# Patient Record
Sex: Female | Born: 1955
Health system: Southern US, Community
[De-identification: ages and names within clinical notes are randomized; demographics above are authoritative.]

## PROBLEM LIST (undated history)

## (undated) DIAGNOSIS — D649 Anemia, unspecified: Secondary | ICD-10-CM

## (undated) DIAGNOSIS — Z9289 Personal history of other medical treatment: Secondary | ICD-10-CM

## (undated) DIAGNOSIS — I10 Essential (primary) hypertension: Secondary | ICD-10-CM

## (undated) DIAGNOSIS — M549 Dorsalgia, unspecified: Secondary | ICD-10-CM

## (undated) DIAGNOSIS — C801 Malignant (primary) neoplasm, unspecified: Secondary | ICD-10-CM

## (undated) DIAGNOSIS — S335XXA Sprain of ligaments of lumbar spine, initial encounter: Secondary | ICD-10-CM

## (undated) DIAGNOSIS — E119 Type 2 diabetes mellitus without complications: Secondary | ICD-10-CM

## (undated) DIAGNOSIS — Z9221 Personal history of antineoplastic chemotherapy: Secondary | ICD-10-CM

## (undated) DIAGNOSIS — Z923 Personal history of irradiation: Secondary | ICD-10-CM

## (undated) DIAGNOSIS — E785 Hyperlipidemia, unspecified: Secondary | ICD-10-CM

## (undated) HISTORY — DX: Personal history of other medical treatment: Z92.89

## (undated) HISTORY — PX: TUBAL LIGATION: SHX77

## (undated) HISTORY — DX: Dorsalgia, unspecified: M54.9

## (undated) HISTORY — DX: Sprain of ligaments of lumbar spine, initial encounter: S33.5XXA

## (undated) HISTORY — DX: Hyperlipidemia, unspecified: E78.5

## (undated) HISTORY — DX: Type 2 diabetes mellitus without complications: E11.9

## (undated) HISTORY — DX: Essential (primary) hypertension: I10

---

## 1998-04-12 ENCOUNTER — Other Ambulatory Visit: Admission: RE | Admit: 1998-04-12 | Discharge: 1998-04-12 | Payer: Self-pay | Admitting: Obstetrics and Gynecology

## 1999-05-03 ENCOUNTER — Other Ambulatory Visit: Admission: RE | Admit: 1999-05-03 | Discharge: 1999-05-03 | Payer: Self-pay | Admitting: Obstetrics and Gynecology

## 2000-06-27 ENCOUNTER — Other Ambulatory Visit: Admission: RE | Admit: 2000-06-27 | Discharge: 2000-06-27 | Payer: Self-pay | Admitting: Obstetrics and Gynecology

## 2005-07-14 HISTORY — PX: OTHER SURGICAL HISTORY: SHX169

## 2005-07-29 ENCOUNTER — Encounter: Admission: RE | Admit: 2005-07-29 | Discharge: 2005-07-29 | Payer: Self-pay | Admitting: Surgery

## 2006-04-15 LAB — CONVERTED CEMR LAB: Pap Smear: NORMAL

## 2007-08-26 ENCOUNTER — Encounter: Payer: Self-pay | Admitting: Internal Medicine

## 2008-06-02 ENCOUNTER — Ambulatory Visit: Payer: Self-pay | Admitting: Internal Medicine

## 2008-06-02 DIAGNOSIS — E785 Hyperlipidemia, unspecified: Secondary | ICD-10-CM

## 2008-06-02 DIAGNOSIS — E119 Type 2 diabetes mellitus without complications: Secondary | ICD-10-CM

## 2008-06-02 DIAGNOSIS — I10 Essential (primary) hypertension: Secondary | ICD-10-CM

## 2008-06-02 HISTORY — DX: Type 2 diabetes mellitus without complications: E11.9

## 2008-06-02 HISTORY — DX: Essential (primary) hypertension: I10

## 2008-06-02 HISTORY — DX: Hyperlipidemia, unspecified: E78.5

## 2008-06-07 LAB — CONVERTED CEMR LAB
Albumin: 4.4 g/dL (ref 3.5–5.2)
BUN: 10 mg/dL (ref 6–23)
Basophils Absolute: 0 10*3/uL (ref 0.0–0.1)
Basophils Relative: 0.6 % (ref 0.0–3.0)
Bilirubin Urine: NEGATIVE
CO2: 29 meq/L (ref 19–32)
Calcium: 9.8 mg/dL (ref 8.4–10.5)
Chloride: 101 meq/L (ref 96–112)
Cholesterol: 185 mg/dL (ref 0–200)
Creatinine,U: 38.1 mg/dL
Eosinophils Absolute: 0 10*3/uL (ref 0.0–0.7)
Eosinophils Relative: 0.3 % (ref 0.0–5.0)
GFR calc Af Amer: 165 mL/min
GFR calc non Af Amer: 137 mL/min
Glucose, Bld: 131 mg/dL — ABNORMAL HIGH (ref 70–99)
HCT: 40.7 % (ref 36.0–46.0)
LDL Cholesterol: 98 mg/dL (ref 0–99)
Leukocytes, UA: NEGATIVE
MCHC: 33 g/dL (ref 30.0–36.0)
Monocytes Absolute: 0.2 10*3/uL (ref 0.1–1.0)
Monocytes Relative: 4.2 % (ref 3.0–12.0)
Platelets: 234 10*3/uL (ref 150–400)
Potassium: 2.9 meq/L — ABNORMAL LOW (ref 3.5–5.1)
RDW: 13 % (ref 11.5–14.6)
Total CHOL/HDL Ratio: 3.4
Total Protein, Urine: NEGATIVE mg/dL
Urine Glucose: 250 mg/dL — AB
Vit D, 25-Hydroxy: 6 ng/mL — ABNORMAL LOW (ref 30–89)
pH: 6 (ref 5.0–8.0)

## 2008-07-04 ENCOUNTER — Telehealth: Payer: Self-pay | Admitting: Internal Medicine

## 2008-08-01 ENCOUNTER — Telehealth: Payer: Self-pay | Admitting: Internal Medicine

## 2008-08-10 ENCOUNTER — Telehealth (INDEPENDENT_AMBULATORY_CARE_PROVIDER_SITE_OTHER): Payer: Self-pay | Admitting: *Deleted

## 2009-01-10 ENCOUNTER — Ambulatory Visit: Payer: Self-pay | Admitting: Internal Medicine

## 2009-01-10 LAB — CONVERTED CEMR LAB
BUN: 8 mg/dL (ref 6–23)
CO2: 28 meq/L (ref 19–32)
Calcium: 9.2 mg/dL (ref 8.4–10.5)
Creatinine, Ser: 0.4 mg/dL (ref 0.4–1.2)
Glucose, Bld: 162 mg/dL — ABNORMAL HIGH (ref 70–99)
HDL: 39.8 mg/dL (ref >39.00)
LDL Cholesterol: 102 mg/dL — ABNORMAL HIGH (ref 0–99)
Total CHOL/HDL Ratio: 4
VLDL: 16.2 mg/dL (ref 0.0–40.0)

## 2009-01-13 ENCOUNTER — Ambulatory Visit: Payer: Self-pay | Admitting: Internal Medicine

## 2009-07-27 ENCOUNTER — Telehealth: Payer: Self-pay | Admitting: Internal Medicine

## 2009-08-03 ENCOUNTER — Telehealth: Payer: Self-pay | Admitting: Internal Medicine

## 2009-08-08 ENCOUNTER — Ambulatory Visit: Payer: Self-pay | Admitting: Internal Medicine

## 2009-08-08 LAB — CONVERTED CEMR LAB
ALT: 20 units/L (ref 0–35)
Albumin: 4.1 g/dL (ref 3.5–5.2)
BUN: 10 mg/dL (ref 6–23)
Basophils Absolute: 0 10*3/uL (ref 0.0–0.1)
Bilirubin, Direct: 0.1 mg/dL (ref 0.0–0.3)
Calcium: 9.4 mg/dL (ref 8.4–10.5)
Cholesterol: 126 mg/dL (ref 0–200)
Creatinine,U: 45.8 mg/dL
HCT: 36.7 % (ref 36.0–46.0)
Hemoglobin: 12.1 g/dL (ref 12.0–15.0)
Hgb A1c MFr Bld: 7.1 % — ABNORMAL HIGH (ref 4.6–6.5)
Ketones, ur: NEGATIVE mg/dL
LDL Cholesterol: 75 mg/dL (ref 0–99)
Leukocytes, UA: NEGATIVE
Lymphs Abs: 1.6 10*3/uL (ref 0.7–4.0)
MCV: 82.6 fL (ref 78.0–100.0)
Microalb Creat Ratio: 15.3 mg/g (ref 0.0–30.0)
Monocytes Absolute: 0.3 10*3/uL (ref 0.1–1.0)
Monocytes Relative: 6.5 % (ref 3.0–12.0)
Platelets: 289 10*3/uL (ref 150.0–400.0)
RBC: 4.44 M/uL (ref 3.87–5.11)
Total CHOL/HDL Ratio: 3
VLDL: 15 mg/dL (ref 0.0–40.0)
WBC: 4.9 10*3/uL (ref 4.5–10.5)
pH: 6 (ref 5.0–8.0)

## 2009-08-11 ENCOUNTER — Ambulatory Visit: Payer: Self-pay | Admitting: Internal Medicine

## 2009-08-11 LAB — HM COLONOSCOPY

## 2009-08-14 ENCOUNTER — Encounter: Payer: Self-pay | Admitting: Internal Medicine

## 2009-08-25 ENCOUNTER — Telehealth: Payer: Self-pay | Admitting: Internal Medicine

## 2009-09-29 ENCOUNTER — Encounter: Payer: Self-pay | Admitting: Internal Medicine

## 2010-02-02 ENCOUNTER — Ambulatory Visit: Payer: Self-pay | Admitting: Internal Medicine

## 2010-03-30 ENCOUNTER — Ambulatory Visit: Payer: Self-pay | Admitting: Internal Medicine

## 2010-04-11 ENCOUNTER — Ambulatory Visit
Admission: RE | Admit: 2010-04-11 | Discharge: 2010-04-11 | Payer: Self-pay | Source: Home / Self Care | Attending: Internal Medicine | Admitting: Internal Medicine

## 2010-04-11 LAB — CONVERTED CEMR LAB
BUN: 11 mg/dL (ref 6–23)
Chloride: 102 meq/L (ref 96–112)
Glucose, Bld: 177 mg/dL — ABNORMAL HIGH (ref 70–99)
HDL: 44 mg/dL (ref >39.00)
LDL Cholesterol: 88 mg/dL (ref 0–99)
Sodium: 139 meq/L (ref 135–145)
Triglycerides: 81 mg/dL (ref 0.0–149.0)

## 2010-04-13 ENCOUNTER — Encounter: Payer: Self-pay | Admitting: Internal Medicine

## 2010-04-13 DIAGNOSIS — M549 Dorsalgia, unspecified: Secondary | ICD-10-CM

## 2010-04-13 DIAGNOSIS — S335XXA Sprain of ligaments of lumbar spine, initial encounter: Secondary | ICD-10-CM | POA: Insufficient documentation

## 2010-04-13 HISTORY — DX: Dorsalgia, unspecified: M54.9

## 2010-04-13 HISTORY — DX: Sprain of ligaments of lumbar spine, initial encounter: S33.5XXA

## 2010-04-13 LAB — CONVERTED CEMR LAB
Bilirubin Urine: NEGATIVE
Ketones, ur: NEGATIVE mg/dL
Leukocytes, UA: NEGATIVE
Nitrite: NEGATIVE
Specific Gravity, Urine: 1.015 (ref 1.000–1.030)
Urine Glucose: >=1000 mg/dL
Urobilinogen, UA: 0.2 (ref 0.0–1.0)

## 2010-05-15 ENCOUNTER — Telehealth: Payer: Self-pay | Admitting: Internal Medicine

## 2010-05-17 NOTE — Assessment & Plan Note (Signed)
Summary: 6 MO ROV /NWS   Vital Signs:  Patient profile:   55 year old female Height:      64.5 inches Weight:      160.50 pounds BMI:     27.22 O2 Sat:      99 % on Room air Temp:     99.3 degrees F oral Pulse rate:   139 / minute BP sitting:   180 / 92  (left arm) Cuff size:   regular  Vitals Entered By: Zella Ball Ewing CMA Duncan Dull) (April 13, 2010 1:46 PM)  O2 Flow:  Room air CC: 6 month ROV/RE   CC:  6 month ROV/RE.  History of Present Illness: here to f/u;  c/o onset 1 wk lower left back pain, no radiation, mild to mod;  better to lie down at night, worse to "make a wrong move" like sitting up quickly or standing up and putting the car seat in the back seat of the car;  no bowel or bladder change, no fever, wt loss , gait change or recent falls;  current meds and tylenol not heleping;  Pt denies CP, worsening sob, doe, wheezing, orthopnea, pnd, worsening LE edema, palps, dizziness or syncope  Pt denies new neuro symptoms such as headache, facial or extremity weakness  Pt denies polydipsia, polyuria, or low sugar symptoms such as shakiness improved with eating.  Overall good compliance with meds, trying to follow low chol, DM diet, wt stable, little excercise however  CBG's in the lower 200's as she stopped the actos due to pt concern about risk of bladder cancer, thought she might do ok with other meds and diet.  No fever, wt loss, night sweats, loss of appetite or other constitutional symptoms   Did not take BP meds this am.  Problems Prior to Update: 1)  Back Pain  (ICD-724.5) 2)  Back Strain, Lumbar  (ICD-847.2) 3)  Preventive Health Care  (ICD-V70.0) 4)  Hypertension  (ICD-401.9) 5)  Hyperlipidemia  (ICD-272.4) 6)  Diabetes Mellitus, Type II  (ICD-250.00)  Medications Prior to Update: 1)  Metformin Hcl 1000 Mg Tabs (Metformin Hcl) .... Bid 2)  Caduet 10-20 Mg Tabs (Amlodipine-Atorvastatin) .Marland Kitchen.. 1 By Mouth Once Daily 3)  Januvia 100 Mg Tabs (Sitagliptin Phosphate) .Marland Kitchen.. 1 By  Mouth Once Daily 4)  Diovan Hct 320-25 Mg Tabs (Valsartan-Hydrochlorothiazide) .... Once Daily 5)  Adult Aspirin Ec Low Strength 81 Mg Tbec (Aspirin) .Marland Kitchen.. 1 By Mouth Once Daily 6)  Klor-Con 10 10 Meq Cr-Tabs (Potassium Chloride) .Marland Kitchen.. 1po Once Daily 7)  Actos 15 Mg Tabs (Pioglitazone Hcl) .Marland Kitchen.. 1 By Mouth Once Daily  Current Medications (verified): 1)  Amlodipine Besylate 10 Mg Tabs (Amlodipine Besylate) .Marland Kitchen.. 1po Once Daily 2)  Janumet 50-1000 Mg Tabs (Sitagliptin-Metformin Hcl) .Marland Kitchen.. 1 By Mouth Two Times A Day 3)  Diovan Hct 320-25 Mg Tabs (Valsartan-Hydrochlorothiazide) .... Once Daily 4)  Adult Aspirin Ec Low Strength 81 Mg Tbec (Aspirin) .Marland Kitchen.. 1 By Mouth Once Daily 5)  Klor-Con 10 10 Meq Cr-Tabs (Potassium Chloride) .Marland Kitchen.. 1po Once Daily 6)  Glimepiride 2 Mg Tabs (Glimepiride) .Marland Kitchen.. 1 By Mouth Once Daily 7)  Lipitor 20 Mg Tabs (Atorvastatin Calcium) .Marland Kitchen.. 1po Once Daily 8)  Flexeril 5 Mg Tabs (Cyclobenzaprine Hcl) .Marland Kitchen.. 1 By Mouth Three Times A Day As Needed Back Pain/spasms 9)  Diclofenac Sodium 75 Mg Tbec (Diclofenac Sodium) .Marland Kitchen.. 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   Head:  normocephalic and atraumatic.   Eyes:  vision grossly intact, pupils equal, and pupils round.   Ears:  R ear normal and L ear normal.   Nose:  no external deformity and no nasal discharge.   Mouth:  no gingival abnormalities and pharynx pink and moist.     Neck:  supple and no masses.   Lungs:  normal respiratory effort and normal breath sounds.   Heart:  normal rate and regular rhythm.   Abdomen:  soft, non-tender, and normal bowel sounds.   Msk:  no joint tenderness and no joint swelling.  , but with left lower lumbar paravertebral tender Extremities:  no edema, no erythema  Neurologic:  strength normal in all extremities and gait normal.     Impression & Recommendations:  Problem # 1:  DIABETES MELLITUS, TYPE II (ICD-250.00)  The following medications were removed from the medication list:    Metformin Hcl 1000 Mg Tabs (Metformin hcl) ..... Bid Her updated medication list for this problem includes:    Janumet 50-1000 Mg Tabs (Sitagliptin-metformin hcl) .Marland Kitchen... 1 by mouth two times a day    Diovan Hct 320-25 Mg Tabs (Valsartan-hydrochlorothiazide) ..... Once daily    Adult Aspirin Ec Low Strength 81 Mg Tbec (Aspirin) .Marland Kitchen... 1 by mouth once daily    Glimepiride 2 Mg Tabs (Glimepiride) .Marland Kitchen... 1 by mouth once daily  Labs Reviewed: Creat: 0.4 (04/11/2010)    Reviewed HgBA1c results: 8.8 (04/11/2010)  7.1 (08/08/2009) currently uncontrolled due to stopping the actos and some dietary indiscretion - to change actos to glimeparide;  Pt to cont DM diet, excercise, wt control efforts; to check labs next visit    Problem # 2:  HYPERTENSION (ICD-401.9)  Her updated medication list for this problem includes:    Amlodipine Besylate 10 Mg Tabs (Amlodipine besylate) .Marland Kitchen... 1po once daily    Diovan Hct 320-25 Mg Tabs (Valsartan-hydrochlorothiazide) ..... Once daily  BP today: 180/92 Prior BP: 150/76 (08/11/2009)  Labs Reviewed: K+: 3.6 (04/11/2010) Creat: : 0.4 (04/11/2010)   Chol: 148 (04/11/2010)   HDL: 44.00 (04/11/2010)   LDL: 88 (04/11/2010)   TG: 81.0 (04/11/2010) urged complaicne with meds - Continue all previous medications as before this visit   Problem # 3:  HYPERLIPIDEMIA (ICD-272.4)  Her updated medication list for this problem  includes:    Lipitor 20 Mg Tabs (Atorvastatin calcium) .Marland Kitchen... 1po once daily  Labs Reviewed: SGOT: 17 (08/08/2009)   SGPT: 20 (08/08/2009)   HDL:44.00 (04/11/2010), 36.50 (08/08/2009)  LDL:88 (04/11/2010), 75 (08/08/2009)  Chol:148 (04/11/2010), 126 (08/08/2009)  Trig:81.0 (04/11/2010), 75.0 (08/08/2009) stable overall by hx and exam, ok to continue meds/tx as is , Pt to continue diet efforts, good med tolerance; to check labs - goal LDL less than 70   Problem # 4:  BACK STRAIN, LUMBAR (ICD-847.2) c/w mostl likely with MSK strain, but cant r/o UTI with low grade temp - for urine studies as well, and treat as above, f/u any worsening signs or symptoms  - for tx with muscle relaxer and  nsaid as needed   Complete Medication List: 1)  Amlodipine Besylate 10 Mg Tabs (Amlodipine besylate) .Marland Kitchen.. 1po once daily 2)  Janumet 50-1000 Mg Tabs (Sitagliptin-metformin hcl) .Marland Kitchen.. 1 by mouth two times a day 3)  Diovan Hct 320-25 Mg Tabs (Valsartan-hydrochlorothiazide) .... Once daily 4)  Adult Aspirin Ec Low Strength 81 Mg Tbec (Aspirin) .Marland Kitchen.. 1 by mouth once daily 5)  Klor-con 10 10 Meq Cr-tabs (Potassium chloride) .Marland Kitchen.. 1po once daily 6)  Glimepiride 2 Mg Tabs (Glimepiride) .Marland Kitchen.. 1 by mouth once daily 7)  Lipitor 20 Mg Tabs (Atorvastatin calcium) .Marland Kitchen.. 1po once daily 8)  Flexeril 5 Mg Tabs (Cyclobenzaprine hcl) .Marland Kitchen.. 1 by mouth three times a day as needed back pain/spasms 9)  Diclofenac Sodium 75 Mg Tbec (Diclofenac sodium) .Marland Kitchen.. 1 by mouth two times a day as needed pain  Other Orders: T-Culture, Urine ([id number]) TLB-Udip w/ Micro (81001-URINE)  Patient Instructions: 1)  Please take all new medications as prescribed - the muscle relaxer and pain medication (for your local pharmacy - written prescriptions) 2)  stop the [place] and metformin after you have finished the current bottles 3)  after that , start the Janumet combination pill (you are given the written 90 day prescription) 4)  stop the actos as you  have 5)  start the glimeparide for diabetes (the 30 day was sent to the local pharmacy, and you are given the written 90 day) 6)  stop the caduet after you use up what you have at home 7)  after that, start the separate prescriptions for the 2 generic meds that make up the caduet - the amlodipine 10 mg, and the lipitor 20 mg (both in the written prescripitons for [facility]) 8)  Continue all previous medications as before this visit  9)  Please follow better Diabetic diet 10)  Please go to the Lab in the basement for your urine tests today  11)  Please schedule a follow-up appointment in 6 months for CPX with labs and: 12)  HbgA1C prior to visit, ICD-9: 250.02 13)  Urine Microalbumin prior to visit, ICD-9: Prescriptions: DICLOFENAC SODIUM 75 MG TBEC (DICLOFENAC SODIUM) 1 by mouth two times a day as needed pain  #60 x 2   Entered and Authorized by:   Corwin Levins MD   Signed by:   Corwin Levins MD on 04/13/2010   Method used:   Print then Give to Patient   RxID:   [id number] FLEXERIL 5 MG TABS (CYCLOBENZAPRINE HCL) 1 by mouth three times a day as needed back pain/spasms  #60 x 0   Entered and Authorized by:   Corwin Levins MD   Signed by:   Corwin Levins MD on 04/13/2010   Method used:   Print then Give to Patient   RxID:   [id number] LIPITOR 20 MG TABS (ATORVASTATIN CALCIUM) 1po once daily  #90 x 3   Entered and Authorized by:   Corwin Levins MD   Signed by:   Corwin Levins MD on 04/13/2010   Method used:   Print then Give to Patient   RxID:   [id number] AMLODIPINE BESYLATE 10 MG TABS (AMLODIPINE BESYLATE) 1po once daily  #90 x 3   Entered and Authorized by:   Corwin Levins MD   Signed by:   Corwin Levins MD on 04/13/2010   Method used:   Print then Give to Patient   RxID:   [id number] GLIMEPIRIDE 2 MG TABS (GLIMEPIRIDE) 1 by mouth  once daily  #90 x 3   Entered and Authorized by:   Corwin Levins MD   Signed by:   Corwin Levins MD on 04/13/2010   Method used:   Print then  Give to Patient   RxID:   [id number] GLIMEPIRIDE 2 MG TABS (GLIMEPIRIDE) 1 by mouth once daily  #30 x 11   Entered and Authorized by:   Corwin Levins MD   Signed by:   Corwin Levins MD on 04/13/2010   Method used:   Electronically to        [facility] [id number] ([facility])       [address]       [place], [place]  [address]       Ph: [phone] or [phone]       Fax: [phone]   RxID:   [id number] JANUMET 50-1000 MG TABS (SITAGLIPTIN-METFORMIN HCL) 1 by mouth two times a day  #180 x 3   Entered and Authorized by:   Corwin Levins MD   Signed by:   Corwin Levins MD on 04/13/2010   Method used:   Print then Give to Patient   RxID:   [id number]    Orders Added: 1)  T-Culture, Urine [[id number]] 2)  TLB-Udip w/ Micro [81001-URINE] 3)  Est. Patient Level IV [10626]

## 2010-05-17 NOTE — Progress Notes (Signed)
  Phone Note Refill Request  on July 27, 2009 1:50 PM  Refills Requested: Medication #1:  JANUVIA 100 MG TABS 1 by mouth once daily   Dosage confirmed as above?Dosage Confirmed   Notes: [facility] Initial call taken by: Scharlene Gloss,  July 27, 2009 1:51 PM    Prescriptions: KLOR-CON 10 10 MEQ CR-TABS (POTASSIUM CHLORIDE) 1po once daily  #90 x 2   Entered by:   Scharlene Gloss   Authorized by:   Corwin Levins MD   Signed by:   Scharlene Gloss on 07/27/2009   Method used:   Faxed to ...       [facility]* (mail-order)             ,          Ph: [phone]       Fax: [phone]   RxID:   [id number] JANUVIA 100 MG TABS (SITAGLIPTIN PHOSPHATE) 1 by mouth once daily  #90 x 2   Entered by:   Scharlene Gloss   Authorized by:   Corwin Levins MD   Signed by:   Scharlene Gloss on 07/27/2009   Method used:   Faxed to ...       [facility]* (mail-order)             ,          Ph: [phone]       Fax: [phone]   RxID:   [id number]

## 2010-05-17 NOTE — Progress Notes (Signed)
Summary: Rx request  Phone Note Call from Patient   Caller: Patient [phone] Summary of Call: pt called requesting a #30 day supply of Klor-con and Januvia be sent to local pharmacy while waiting for mail order. Pt advised via VM [facility] [address] Initial call taken by: Margaret Pyle, CMA,  August 03, 2009 3:10 PM    Prescriptions: KLOR-CON 10 10 MEQ CR-TABS (POTASSIUM CHLORIDE) 1po once daily  #30 x 0   Entered by:   Margaret Pyle, CMA   Authorized by:   Corwin Levins MD   Signed by:   Margaret Pyle, CMA on 08/03/2009   Method used:   [facility] to        [id number]       [address]       [place], [place]  [address]       Ph: [phone] or [phone]       Fax: [phone]   RxID:   [id number] JANUVIA 100 MG TABS (SITAGLIPTIN PHOSPHATE) 1 by mouth once daily  #30 x 0   Entered by:   Margaret Pyle, CMA   Authorized by:   Corwin Levins MD   Signed by:   Margaret Pyle, CMA on 08/03/2009   Method used:   [facility] to        [id number]       [address]       [place], [place]  [address]       Ph: [phone] or [phone]       Fax: [phone]   RxID:   [id number]

## 2010-05-17 NOTE — Assessment & Plan Note (Signed)
Summary: 6 MO ROV /NWS #   Vital Signs:  Patient profile:   55 year old female Height:      64.5 inches Weight:      168 pounds BMI:     28.49 O2 Sat:      99 % on Room air Temp:     97 degrees F oral Pulse rate:   118 / minute BP sitting:   150 / 76  (left arm) Cuff size:   regular  Vitals Entered ByZella Ball Ewing (August 11, 2009 2:11 PM)  O2 Flow:  Room air  Preventive Care Screening  Colonoscopy:    Date:  08/11/2009    Results:  declined  CC: 6 Month  ROV/RE   CC:  6 Month  ROV/RE.  History of Present Illness: overall doing ok,  Pt denies CP, sob, doe, wheezing, orthopnea, pnd, worsening LE edema, palps, dizziness or syncope;  Pt denies new neuro symptoms such as headache, facial or extremity weakness   Pt denies polydipsia, polyuria, or low sugar symptoms such as shakiness improved with eating.  Overall good compliance with meds, trying to follow low chol, DM diet, wt stable, little excercise however   Preventive Screening-Counseling & Management      Drug Use:  no.    Problems Prior to Update: 1)  Preventive Health Care  (ICD-V70.0) 2)  Hypertension  (ICD-401.9) 3)  Hyperlipidemia  (ICD-272.4) 4)  Diabetes Mellitus, Type II  (ICD-250.00)  Medications Prior to Update: 1)  Metformin Hcl 1000 Mg Tabs (Metformin Hcl) .... Bid 2)  Caduet 10-20 Mg Tabs (Amlodipine-Atorvastatin) .Marland Kitchen.. 1 By Mouth Once Daily 3)  Januvia 100 Mg Tabs (Sitagliptin Phosphate) .Marland Kitchen.. 1 By Mouth Once Daily 4)  Diovan Hct 320-25 Mg Tabs (Valsartan-Hydrochlorothiazide) .... Once Daily 5)  Adult Aspirin Ec Low Strength 81 Mg Tbec (Aspirin) .Marland Kitchen.. 1 By Mouth Once Daily 6)  Klor-Con 10 10 Meq Cr-Tabs (Potassium Chloride) .Marland Kitchen.. 1po Once Daily 7)  Actos 15 Mg Tabs (Pioglitazone Hcl) .Marland Kitchen.. 1 By Mouth Once Daily  Current Medications (verified): 1)  Metformin Hcl 1000 Mg Tabs (Metformin Hcl) .... Bid 2)  Caduet 10-20 Mg Tabs (Amlodipine-Atorvastatin) .Marland Kitchen.. 1 By Mouth Once Daily 3)  Januvia 100 Mg Tabs  (Sitagliptin Phosphate) .Marland Kitchen.. 1 By Mouth Once Daily 4)  Diovan Hct 320-25 Mg Tabs (Valsartan-Hydrochlorothiazide) .... Once Daily 5)  Adult Aspirin Ec Low Strength 81 Mg Tbec (Aspirin) .Marland Kitchen.. 1 By Mouth Once Daily 6)  Klor-Con 10 10 Meq Cr-Tabs (Potassium Chloride) .Marland Kitchen.. 1po Once Daily 7)  Actos 15 Mg Tabs (Pioglitazone Hcl) .Marland Kitchen.. 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         all otherwise negative per pt -    Physical Exam  General:  alert and overweight-appearing.   Head:  normocephalic and atraumatic.   Eyes:  vision grossly intact, pupils equal, and pupils round.   Ears:  R ear normal and L ear normal.   Nose:  no external deformity and no nasal discharge.   Mouth:  no gingival  abnormalities and pharynx pink and moist.   Neck:  supple and no masses.   Lungs:  normal respiratory effort and normal breath sounds.   Heart:  normal rate and regular rhythm.   Abdomen:  soft, non-tender, and normal bowel sounds.   Msk:  no joint tenderness and no joint swelling.   Extremities:  no edema, no erythema  Neurologic:  cranial nerves II-XII intact and strength normal in all extremities.   Skin:  color normal and no rashes.     Impression & Recommendations:  Problem # 1:  Preventive Health Care (ICD-V70.0) Overall doing well, age appropriate education and counseling updated and referral for appropriate preventive services done unless declined, immunizations up to date or declined, diet counseling done if overweight, urged to quit smoking if smokes , most recent labs reviewed and current ordered if appropriate, ecg reviewed or declined (interpretation per ECG scanned in the EMR if done); information regarding Medicare Prevention requirements given if appropriate   Problem # 2:  DIABETES MELLITUS, TYPE II (ICD-250.00)  Her updated medication list for this problem includes:    Metformin Hcl 1000 Mg Tabs (Metformin hcl) ..... Bid    Januvia 100 Mg Tabs (Sitagliptin phosphate) .Marland Kitchen... 1 by mouth once daily    Diovan Hct 320-25 Mg Tabs (Valsartan-hydrochlorothiazide) ..... Once daily    Adult Aspirin Ec Low Strength 81 Mg Tbec (Aspirin) .Marland Kitchen... 1 by mouth once daily    Actos 15 Mg Tabs (Pioglitazone hcl) .Marland Kitchen... 1 by mouth once daily a1c slightly elevatede; she plans to excercise more and lose wt; Continue all previous medications as before this visit   Problem # 3:  HYPERTENSION (ICD-401.9)  Her updated medication list for this problem includes:    Caduet 10-20 Mg Tabs (Amlodipine-atorvastatin) .Marland Kitchen... 1 by mouth once daily    Diovan Hct 320-25 Mg Tabs (Valsartan-hydrochlorothiazide) ..... Once daily  BP today: 150/76 Prior BP: 164/88 (01/13/2009)  Labs Reviewed: K+: 3.6  (08/08/2009) Creat: : 0.5 (08/08/2009)   Chol: 126 (08/08/2009)   HDL: 36.50 (08/08/2009)   LDL: 75 (08/08/2009)   TG: 75.0 (08/08/2009) as above, declines incresed meds today  Complete Medication List: 1)  Metformin Hcl 1000 Mg Tabs (Metformin hcl) .... Bid 2)  Caduet 10-20 Mg Tabs (Amlodipine-atorvastatin) .Marland Kitchen.. 1 by mouth once daily 3)  Januvia 100 Mg Tabs (Sitagliptin phosphate) .Marland Kitchen.. 1 by mouth once daily 4)  Diovan Hct 320-25 Mg Tabs (Valsartan-hydrochlorothiazide) .... Once daily 5)  Adult Aspirin Ec Low Strength 81 Mg Tbec (Aspirin) .Marland Kitchen.. 1 by mouth once daily 6)  Klor-con 10 10 Meq Cr-tabs (Potassium chloride) .Marland Kitchen.. 1po once daily 7)  Actos 15 Mg Tabs (Pioglitazone hcl) .Marland Kitchen.. 1 by mouth once daily  Patient Instructions: 1)  Continue all previous medications as before this visit 2)  Please schedule a follow-up appointment in 6 months with: 3)  BMP prior to visit, ICD-9: 250.02 4)  Lipid Panel prior to visit, ICD-9: 5)  HbgA1C prior to visit, ICD-9:

## 2010-05-17 NOTE — Letter (Signed)
Summary: [facility]  [facility]   Imported By: Sherian Rein 10/05/2009 15:19:59  _____________________________________________________________________  External Attachment:    Type:   Image     Comment:   External Document

## 2010-05-17 NOTE — Progress Notes (Signed)
Summary: refill diovan  Phone Note Refill Request Message from:  Fax from Pharmacy on Aug 25, 2009 11:57 AM  Refills Requested: Medication #1:  DIOVAN HCT 320-25 MG TABS once daily [facility]   Method Requested: Fax to Local Pharmacy Initial call taken by: Duard Brady LPN,  Aug 25, 2009 11:57 AM    Prescriptions: DIOVAN HCT 320-25 MG TABS (VALSARTAN-HYDROCHLOROTHIAZIDE) once daily  #90 x 3   Entered by:   Duard Brady LPN   Authorized by:   Corwin Levins MD   Signed by:   Duard Brady LPN on 04/54/0981   Method used:   Faxed to ...       [facility] (mail-order)             ,          Ph: [phone]       Fax: [phone]   RxID:   [id number]

## 2010-05-23 NOTE — Progress Notes (Signed)
  Phone Note Refill Request Message from:  Fax from Pharmacy on May 15, 2010 9:03 AM  Refills Requested: Medication #1:  JANUMET 50-1000 MG TABS 1 by mouth two times a day   Dosage confirmed as above?Dosage Confirmed   Notes: [facility]  Medication #2:  GLIMEPIRIDE 2 MG TABS 1 by mouth once daily   Dosage confirmed as above?Dosage Confirmed   Notes: [facility] Initial call taken by: [name] Ewing CMA (AAMA),  May 15, 2010 9:03 AM    Prescriptions: GLIMEPIRIDE 2 MG TABS (GLIMEPIRIDE) 1 by mouth once daily  #90 x 3   Entered by:   Scharlene Gloss CMA (AAMA)   Authorized by:   Corwin Levins MD   Signed by:   Scharlene Gloss CMA (AAMA) on 05/15/2010   Method used:   Faxed to ...       [facility] ([facility])             , [place]         Ph: [phone]       Fax: [phone]   RxID:   [id number] JANUMET 50-1000 MG TABS (SITAGLIPTIN-METFORMIN HCL) 1 by mouth two times a day  #180 x 3   Entered by:   Zella Ball Ewing CMA (AAMA)   Authorized by:   Corwin Levins MD   Signed by:   Scharlene Gloss CMA (AAMA) on 05/15/2010   Method used:   Faxed to ...       [facility] (mail-[facility])             , [place]         Ph: [phone]       Fax: [phone]   RxID:   [id number]

## 2010-07-08 ENCOUNTER — Other Ambulatory Visit: Payer: Self-pay | Admitting: Internal Medicine

## 2010-07-08 NOTE — Telephone Encounter (Signed)
To robin   

## 2010-07-09 ENCOUNTER — Other Ambulatory Visit: Payer: Self-pay

## 2010-07-09 MED ORDER — POTASSIUM CHLORIDE 10 MEQ PO TBCR: 10.0000 meq | EXTENDED_RELEASE_TABLET | Freq: Every day | ORAL | Status: DC

## 2010-07-09 NOTE — Telephone Encounter (Signed)
Pharmacy requesting refill on patients Klor Con

## 2010-10-05 ENCOUNTER — Other Ambulatory Visit: Payer: Self-pay

## 2010-10-05 ENCOUNTER — Other Ambulatory Visit: Payer: Self-pay | Admitting: Internal Medicine

## 2010-10-05 DIAGNOSIS — Z0000 Encounter for general adult medical examination without abnormal findings: Secondary | ICD-10-CM

## 2010-10-05 DIAGNOSIS — IMO0001 Reserved for inherently not codable concepts without codable children: Secondary | ICD-10-CM

## 2010-10-12 ENCOUNTER — Ambulatory Visit: Payer: Self-pay | Admitting: Internal Medicine

## 2010-10-22 ENCOUNTER — Other Ambulatory Visit: Payer: Self-pay | Admitting: Internal Medicine

## 2010-11-02 ENCOUNTER — Other Ambulatory Visit: Payer: Self-pay

## 2010-11-04 ENCOUNTER — Encounter: Payer: Self-pay | Admitting: Internal Medicine

## 2010-11-04 DIAGNOSIS — Z0001 Encounter for general adult medical examination with abnormal findings: Secondary | ICD-10-CM | POA: Insufficient documentation

## 2010-11-04 DIAGNOSIS — Z0000 Encounter for general adult medical examination without abnormal findings: Secondary | ICD-10-CM | POA: Insufficient documentation

## 2010-11-09 ENCOUNTER — Ambulatory Visit: Payer: Self-pay | Admitting: Internal Medicine

## 2010-12-14 ENCOUNTER — Ambulatory Visit: Payer: Self-pay | Admitting: Internal Medicine

## 2011-01-11 ENCOUNTER — Ambulatory Visit: Payer: Self-pay | Admitting: Internal Medicine

## 2011-01-15 ENCOUNTER — Other Ambulatory Visit (INDEPENDENT_AMBULATORY_CARE_PROVIDER_SITE_OTHER): Payer: Self-pay

## 2011-01-15 DIAGNOSIS — IMO0001 Reserved for inherently not codable concepts without codable children: Secondary | ICD-10-CM

## 2011-01-15 DIAGNOSIS — Z0000 Encounter for general adult medical examination without abnormal findings: Secondary | ICD-10-CM

## 2011-01-15 LAB — URINALYSIS
Bilirubin Urine: NEGATIVE
Hgb urine dipstick: NEGATIVE
Ketones, ur: NEGATIVE
Specific Gravity, Urine: <=1.005 (ref 1.000–1.030)
Urobilinogen, UA: 0.2 (ref 0.0–1.0)

## 2011-01-15 LAB — BASIC METABOLIC PANEL
BUN: 11 mg/dL (ref 6–23)
CO2: 27 mEq/L (ref 19–32)
Calcium: 9.4 mg/dL (ref 8.4–10.5)
Chloride: 102 mEq/L (ref 96–112)
Creatinine, Ser: 0.4 mg/dL (ref 0.4–1.2)
GFR: 179.87 mL/min (ref >60.00)
Potassium: 3.6 mEq/L (ref 3.5–5.1)
Sodium: 139 mEq/L (ref 135–145)

## 2011-01-15 LAB — MICROALBUMIN / CREATININE URINE RATIO
Creatinine,U: 26.5 mg/dL
Microalb, Ur: 1.4 mg/dL (ref 0.0–1.9)

## 2011-01-15 LAB — HEPATIC FUNCTION PANEL
Albumin: 4.3 g/dL (ref 3.5–5.2)
Bilirubin, Direct: 0.1 mg/dL (ref 0.0–0.3)

## 2011-01-15 LAB — CBC WITH DIFFERENTIAL/PLATELET
Basophils Absolute: 0 10*3/uL (ref 0.0–0.1)
Basophils Relative: 0.3 % (ref 0.0–3.0)
Eosinophils Absolute: 0 10*3/uL (ref 0.0–0.7)
Eosinophils Relative: 0.4 % (ref 0.0–5.0)
HCT: 39.5 % (ref 36.0–46.0)
Lymphocytes Relative: 29 % (ref 12.0–46.0)
MCHC: 32.6 g/dL (ref 30.0–36.0)
MCV: 81.9 fl (ref 78.0–100.0)
Neutro Abs: 3.1 10*3/uL (ref 1.4–7.7)
Neutrophils Relative %: 63.9 % (ref 43.0–77.0)
Platelets: 289 10*3/uL (ref 150.0–400.0)
RBC: 4.82 Mil/uL (ref 3.87–5.11)

## 2011-01-15 LAB — LIPID PANEL
LDL Cholesterol: 81 mg/dL (ref 0–99)
Total CHOL/HDL Ratio: 3

## 2011-01-15 LAB — TSH: TSH: 1.46 u[IU]/mL (ref 0.35–5.50)

## 2011-01-18 ENCOUNTER — Encounter: Payer: Self-pay | Admitting: Internal Medicine

## 2011-01-18 ENCOUNTER — Ambulatory Visit (INDEPENDENT_AMBULATORY_CARE_PROVIDER_SITE_OTHER): Payer: 59 | Admitting: Internal Medicine

## 2011-01-18 VITALS — BP 182/80 | HR 127 | Temp 98.8°F | Ht 65.0 in | Wt 169.0 lb

## 2011-01-18 DIAGNOSIS — Z0000 Encounter for general adult medical examination without abnormal findings: Secondary | ICD-10-CM

## 2011-01-18 DIAGNOSIS — I10 Essential (primary) hypertension: Secondary | ICD-10-CM

## 2011-01-18 DIAGNOSIS — Z23 Encounter for immunization: Secondary | ICD-10-CM

## 2011-01-18 DIAGNOSIS — E119 Type 2 diabetes mellitus without complications: Secondary | ICD-10-CM

## 2011-01-18 MED ORDER — AMLODIPINE-VALSARTAN-HCTZ 10-320-25 MG PO TABS: 1.0000 | ORAL_TABLET | Freq: Every day | ORAL | Status: DC

## 2011-01-18 MED ORDER — PNEUMOCOCCAL VAC POLYVALENT 25 MCG/0.5ML IJ INJ
0.5000 mL | INJECTION | Freq: Once | INTRAMUSCULAR | Status: AC
Start: 2011-01-18 — End: 2011-01-18
  Administered 2011-01-18: 0.5 mL via INTRAMUSCULAR

## 2011-01-18 MED ORDER — NEBIVOLOL HCL 10 MG PO TABS: 10.0000 mg | ORAL_TABLET | Freq: Every day | ORAL | Status: DC

## 2011-01-18 NOTE — Patient Instructions (Addendum)
Stop the amlodipine and the diovan HCT at the same time Then start the exforge HCT - 1 per day Also, start the bystolic at 10 mg per day Please get regular exercise, wt control, and low salt Diabetic diet You had the pneumonia shot today Your flu shot will be next wk at work Please return in 6 mo with Lab testing done 3-5 days before

## 2011-01-18 NOTE — Assessment & Plan Note (Signed)
Uncontrolled, to change diovan hct and amlod to the exforge HCT, and add bystolic 10 qd,  to f/u any worsening symptoms or concerns

## 2011-01-18 NOTE — Progress Notes (Signed)
Subjective:    Patient ID: Karen Good, female    DOB: 16-Oct-1955, 55 y.o.   MRN: [id number]  HPI  Here for wellness and f/u;  Overall doing ok;  Pt denies CP, worsening SOB, DOE, wheezing, orthopnea, PND, worsening LE edema, palpitations, dizziness or syncope.  Pt denies neurological change such as new Headache, facial or extremity weakness.  Pt denies polydipsia, polyuria, or low sugar symptoms. Pt states overall good compliance with treatment and medications, good tolerability, and trying to follow lower cholesterol diet.  Pt denies worsening depressive symptoms, suicidal ideation or panic. No fever, wt loss, night sweats, loss of appetite, or other constitutional symptoms.  Pt states good ability with ADL's, low fall risk, home safety reviewed and adequate, no significant changes in hearing or vision, and occasionally active with exercise. Has gained a few lbs with less exercise recently, and BP elev at home as well - ave about 150 sbp. Past Medical History  Diagnosis Date  . BACK PAIN 04/13/2010  . BACK STRAIN, LUMBAR 04/13/2010  . DIABETES MELLITUS, TYPE II 06/02/2008  . HYPERLIPIDEMIA 06/02/2008  . HYPERTENSION 06/02/2008   Past Surgical History  Procedure Date  . S/p breast biopsy 4/07    benign    reports that she has never smoked. She does not have any smokeless tobacco history on file. She reports that she does not drink alcohol or use illicit drugs. family history includes Diabetes in her father, mother, and other; Heart disease in her father; and Hypertension in her father. No Known Allergies Current Outpatient Prescriptions on File Prior to Visit  Medication Sig Dispense Refill  . aspirin 81 MG tablet Take 81 mg by mouth daily.        Marland Kitchen atorvastatin (LIPITOR) 20 MG tablet Take 20 mg by mouth daily.        Marland Kitchen glimepiride (AMARYL) 2 MG tablet Take 2 mg by mouth daily.        . potassium chloride (KLOR-CON 10) 10 MEQ CR tablet Take 1 tablet (10 mEq total) by mouth daily.  90  tablet  3  . sitaGLIPtan-metformin (JANUMET) 50-1000 MG per tablet Take 1 tablet by mouth 2 (two) times daily.         Review of Systems Review of Systems  Constitutional: Negative for diaphoresis, activity change, appetite change and unexpected weight change.  HENT: Negative for hearing loss, ear pain, facial swelling, mouth sores and neck stiffness.   Eyes: Negative for pain, redness and visual disturbance.  Respiratory: Negative for shortness of breath and wheezing.   Cardiovascular: Negative for chest pain and palpitations.  Gastrointestinal: Negative for diarrhea, blood in stool, abdominal distention and rectal pain.  Genitourinary: Negative for hematuria, flank pain and decreased urine volume.  Musculoskeletal: Negative for myalgias and joint swelling.  Skin: Negative for color change and wound.  Neurological: Negative for syncope and numbness.  Hematological: Negative for adenopathy.  Psychiatric/Behavioral: Negative for hallucinations, self-injury, decreased concentration and agitation.      Objective:   Physical Exam BP 182/80  Pulse 127  Temp(Src) 98.8 F (37.1 C) (Oral)  Ht 5\' 5"  (1.651 m)  Wt 169 lb (76.658 kg)  BMI 28.12 kg/m2  SpO2 95% Physical Exam  VS noted Constitutional: Pt is oriented to person, place, and time. Appears well-developed and well-nourished.  HENT:  Head: Normocephalic and atraumatic.  Right Ear: External ear normal.  Left Ear: External ear normal.  Nose: Nose normal.  Mouth/Throat: Oropharynx is clear and moist.  Eyes: Conjunctivae  and EOM are normal. Pupils are equal, round, and reactive to light.  Neck: Normal range of motion. Neck supple. No JVD present. No tracheal deviation present.  Cardiovascular: Normal rate, regular rhythm, normal heart sounds and intact distal pulses.   Pulmonary/Chest: Effort normal and breath sounds normal.  Abdominal: Soft. Bowel sounds are normal. There is no tenderness.  Musculoskeletal: Normal range of motion.  Exhibits no edema.  Lymphadenopathy:  Has no cervical adenopathy.  Neurological: Pt is alert and oriented to person, place, and time. Pt has normal reflexes. No cranial nerve deficit. Motor 5/5 Skin: Skin is warm and dry. No rash noted.  Psychiatric:  Has  normal mood and affect. Behavior is normal. 1+ nervous    Assessment & Plan:

## 2011-01-19 ENCOUNTER — Encounter: Payer: Self-pay | Admitting: Internal Medicine

## 2011-01-19 NOTE — Assessment & Plan Note (Signed)

## 2011-01-19 NOTE — Assessment & Plan Note (Signed)
stable overall by hx and exam, most recent data reviewed with pt, and pt to continue medical treatment as before  Lab Results  Component Value Date   HGBA1C 7.7* 01/15/2011

## 2011-05-20 ENCOUNTER — Telehealth: Payer: Self-pay

## 2011-05-20 MED ORDER — HYDROCHLOROTHIAZIDE 25 MG PO TABS: 25.0000 mg | ORAL_TABLET | Freq: Every day | ORAL | Status: DC

## 2011-05-20 MED ORDER — VALSARTAN 320 MG PO TABS: 320.0000 mg | ORAL_TABLET | Freq: Every day | ORAL | Status: DC

## 2011-05-20 MED ORDER — AMLODIPINE BESYLATE 10 MG PO TABS: 10.0000 mg | ORAL_TABLET | Freq: Every day | ORAL | Status: DC

## 2011-05-20 NOTE — Telephone Encounter (Signed)
Patient has been only paying $4 for amlodipine-valsartan-HCTZ. Went to pharmacy to pickup yesterday and was $120. Please advise on alternative [phone]

## 2011-05-20 NOTE — Telephone Encounter (Signed)
This is most likely due to the combination medicine no longer being covered under her insurance  OK to split up the meds to the 3 generic components - done per emr

## 2011-05-20 NOTE — Telephone Encounter (Signed)
Patient informed. 

## 2011-05-22 ENCOUNTER — Telehealth: Payer: Self-pay

## 2011-05-22 NOTE — Telephone Encounter (Signed)
Yes, ok to take the bystolic with all other meds

## 2011-05-22 NOTE — Telephone Encounter (Signed)
Patient would like to know if she should continue taking Bystolic 10 mg along with other BP meds? Please advise

## 2011-05-23 NOTE — Telephone Encounter (Signed)
Called patient; left message to call back.

## 2011-05-23 NOTE — Telephone Encounter (Signed)
Patient informed. 

## 2011-05-26 ENCOUNTER — Other Ambulatory Visit: Payer: Self-pay | Admitting: Internal Medicine

## 2011-06-24 ENCOUNTER — Telehealth: Payer: Self-pay

## 2011-06-24 MED ORDER — ATENOLOL 50 MG PO TABS: 50.0000 mg | ORAL_TABLET | Freq: Every day | ORAL | Status: DC

## 2011-06-24 MED ORDER — IRBESARTAN 300 MG PO TABS: 300.0000 mg | ORAL_TABLET | Freq: Every day | ORAL | Status: DC

## 2011-06-24 NOTE — Telephone Encounter (Signed)
meds changed to generic irbesartan (avapro) and atenolol - done escript to [facility]

## 2011-06-24 NOTE — Telephone Encounter (Signed)
Patient called to inform Bystolic and Diovan too expensive, please send alternative to [facility] [address]

## 2011-06-25 NOTE — Telephone Encounter (Signed)
Called the patient left message that prescriptions have been changed and sent to [facility].

## 2011-06-28 ENCOUNTER — Other Ambulatory Visit: Payer: Self-pay | Admitting: Internal Medicine

## 2011-08-09 ENCOUNTER — Ambulatory Visit: Payer: 59 | Admitting: Internal Medicine

## 2011-09-05 ENCOUNTER — Encounter: Payer: Self-pay | Admitting: Internal Medicine

## 2011-09-13 ENCOUNTER — Ambulatory Visit: Payer: 59 | Admitting: Internal Medicine

## 2011-10-05 ENCOUNTER — Other Ambulatory Visit: Payer: Self-pay | Admitting: Internal Medicine

## 2011-10-11 ENCOUNTER — Ambulatory Visit: Payer: 59 | Admitting: Internal Medicine

## 2011-11-08 ENCOUNTER — Ambulatory Visit: Payer: 59 | Admitting: Internal Medicine

## 2011-11-19 ENCOUNTER — Other Ambulatory Visit (INDEPENDENT_AMBULATORY_CARE_PROVIDER_SITE_OTHER): Payer: 59

## 2011-11-19 ENCOUNTER — Telehealth: Payer: Self-pay

## 2011-11-19 DIAGNOSIS — E119 Type 2 diabetes mellitus without complications: Secondary | ICD-10-CM

## 2011-11-19 LAB — BASIC METABOLIC PANEL
CO2: 27 mEq/L (ref 19–32)
Calcium: 9.4 mg/dL (ref 8.4–10.5)
Sodium: 138 mEq/L (ref 135–145)

## 2011-11-19 LAB — LIPID PANEL
HDL: 49.2 mg/dL (ref >39.00)
Total CHOL/HDL Ratio: 3

## 2011-11-19 NOTE — Telephone Encounter (Signed)
Put lab order in. 

## 2011-11-22 ENCOUNTER — Ambulatory Visit (INDEPENDENT_AMBULATORY_CARE_PROVIDER_SITE_OTHER): Payer: 59 | Admitting: Internal Medicine

## 2011-11-22 ENCOUNTER — Encounter: Payer: Self-pay | Admitting: Internal Medicine

## 2011-11-22 VITALS — BP 160/100 | HR 103 | Temp 97.8°F | Ht 65.0 in | Wt 170.0 lb

## 2011-11-22 DIAGNOSIS — E785 Hyperlipidemia, unspecified: Secondary | ICD-10-CM

## 2011-11-22 DIAGNOSIS — Z0000 Encounter for general adult medical examination without abnormal findings: Secondary | ICD-10-CM

## 2011-11-22 DIAGNOSIS — I10 Essential (primary) hypertension: Secondary | ICD-10-CM

## 2011-11-22 DIAGNOSIS — E119 Type 2 diabetes mellitus without complications: Secondary | ICD-10-CM

## 2011-11-22 MED ORDER — GLIPIZIDE ER 5 MG PO TB24: 5.0000 mg | ORAL_TABLET | Freq: Every day | ORAL | Status: DC

## 2011-11-22 MED ORDER — LABETALOL HCL 200 MG PO TABS: 200.0000 mg | ORAL_TABLET | Freq: Two times a day (BID) | ORAL | Status: DC

## 2011-11-22 MED ORDER — IRBESARTAN 300 MG PO TABS: 300.0000 mg | ORAL_TABLET | Freq: Every day | ORAL | Status: DC

## 2011-11-22 NOTE — Patient Instructions (Addendum)
OK to increase the glimeparide to 2 mg twice per day, until your current bottle is done, then stop this medication Then start the glipizide ER (24hr) 5 mg per day Please follow stricter Diabetic diet OK to take the Avapro in the AM OK to stop the amlodipine (norvasc) when the new medication arrives from the mail order pharmacy Take all new medications as prescribed  - the labetolol 200 mg twice per day Please call with your blood pressures after 2 wks, as the labetolol could be increased to 300 mg if needed Please return in 6 mo with Lab testing done 3-5 days before

## 2011-11-23 ENCOUNTER — Encounter: Payer: Self-pay | Admitting: Internal Medicine

## 2011-11-23 NOTE — Assessment & Plan Note (Signed)
Mild uncontrolled, but o/w stable overall by hx and exam, most recent data reviewed with pt, and pt to continue medical treatment as before Lab Results  Component Value Date   HGBA1C 9.5* 11/19/2011   To change to glipizide ER 5 qd, cont all other meds, cont diet and wt loss efforts

## 2011-11-23 NOTE — Progress Notes (Signed)
Subjective:    Patient ID: Karen Good, female    DOB: 12/27/1955, 56 y.o.   MRN: [id number]  HPI  Here to f/u; overall doing ok,  Pt denies chest pain, increased sob or doe, wheezing, orthopnea, PND, [name], palpitations, dizziness or syncope.  Pt denies new neurological symptoms such as new headache, or facial or extremity weakness or numbness   Pt denies polydipsia, polyuria, or low sugar symptoms such as weakness or confusion improved with po intake.  Pt states overall good compliance with meds, trying to follow lower cholesterol, diabetic diet, wt overall stable but little exercise however. Taking the avapro in the PM, and BP at home has been mild elevated as well persistent.   Pt denies fever, wt loss, night sweats, loss of appetite, or other constitutional symptoms   Past Medical History  Diagnosis Date  . BACK PAIN 04/13/2010  . BACK STRAIN, LUMBAR 04/13/2010  . DIABETES MELLITUS, TYPE II 06/02/2008  . HYPERLIPIDEMIA 06/02/2008  . HYPERTENSION 06/02/2008   Past Surgical History  Procedure Date  . S/p breast biopsy 4/07    benign    reports that she has never smoked. She does not have any smokeless tobacco history on file. She reports that she does not drink alcohol or use illicit drugs. family history includes Diabetes in her father, mother, and other; Heart disease in her father; and Hypertension in her father. No Known Allergies Current Outpatient Prescriptions on File Prior to Visit  Medication Sig Dispense Refill  . aspirin 81 MG tablet Take 81 mg by mouth daily.        Marland Kitchen atenolol (TENORMIN) 50 MG tablet Take 1 tablet (50 mg total) by mouth daily.  90 tablet  3  . atorvastatin (LIPITOR) 20 MG tablet TAKE 1 TABLET ONCE DAILY  90 tablet  2  . hydrochlorothiazide (HYDRODIURIL) 25 MG tablet Take 1 tablet (25 mg total) by mouth daily.  90 tablet  3  . irbesartan (AVAPRO) 300 MG tablet Take 1 tablet (300 mg total) by mouth daily.  90 tablet  3  . JANUMET 50-1000 MG  per tablet TAKE 1 TABLET TWO TIMES A DAY  180 tablet  2  . potassium chloride (K-DUR,KLOR-CON) 10 MEQ tablet TAKE 1 TABLET DAILY  90 tablet  2  . glipiZIDE (GLUCOTROL XL) 5 MG 24 hr tablet Take 1 tablet (5 mg total) by mouth daily.  90 tablet  3  . labetalol (NORMODYNE) 200 MG tablet Take 1 tablet (200 mg total) by mouth 2 (two) times daily.  180 tablet  3  . potassium chloride (KLOR-CON 10) 10 MEQ CR tablet Take 1 tablet (10 mEq total) by mouth daily.  90 tablet  3  . DISCONTD: nebivolol (BYSTOLIC) 10 MG tablet Take 1 tablet (10 mg total) by mouth daily.  90 tablet  3  . DISCONTD: valsartan (DIOVAN) 320 MG tablet Take 1 tablet (320 mg total) by mouth daily.  90 tablet  3   Review of Systems Constitutional: Negative for diaphoresis and unexpected weight change.  HENT: Negative for tinnitus.   Eyes: Negative for photophobia and visual disturbance.  Respiratory: Negative for choking and stridor.   Gastrointestinal: Negative for vomiting and blood in stool.  Genitourinary: Negative for hematuria and decreased urine volume.  Musculoskeletal: Negative for gait problem.  Skin: Negative for color change and wound.  Neurological: Negative for tremors and numbness.     Objective:   Physical Exam BP 160/100  Pulse 103  Temp  97.8 F (36.6 C) (Oral)  Ht 5\' 5"  (1.651 m)  Wt 170 lb (77.111 kg)  BMI 28.29 kg/m2  SpO2 96% Physical Exam  VS noted Constitutional: Pt appears well-developed and well-nourished.  HENT: Head: Normocephalic.  Right Ear: External ear normal.  Left Ear: External ear normal.  Eyes: Conjunctivae and EOM are normal. Pupils are equal, round, and reactive to light.  Neck: Normal range of motion. Neck supple.  Cardiovascular: Normal rate and regular rhythm.   Pulmonary/Chest: Effort normal and breath sounds normal.  Neurological: Pt is alert. Not confused.  Skin: Skin is warm. No erythema.  Psychiatric: Pt behavior is normal. Thought content normal.     Assessment & Plan:

## 2011-11-23 NOTE — Assessment & Plan Note (Signed)
Uncontrolled, Mild, to take the avapro in the AM, also change amlodipine to labetolol 200 bid,  to f/u any worsening symptoms or concerns, check BP at home, call in 1-2 wks, consider increase to 300 bid

## 2011-11-23 NOTE — Assessment & Plan Note (Signed)
stable overall by hx and exam, most recent data reviewed with pt, and pt to continue medical treatment as before Lab Results  Component Value Date   LDLCALC 94 11/19/2011

## 2012-03-16 ENCOUNTER — Other Ambulatory Visit: Payer: Self-pay | Admitting: Internal Medicine

## 2012-03-16 MED ORDER — GLIMEPIRIDE 2 MG PO TABS: 2.0000 mg | ORAL_TABLET | Freq: Every day | ORAL | Status: DC

## 2012-03-16 MED ORDER — ATORVASTATIN CALCIUM 20 MG PO TABS: 20.0000 mg | ORAL_TABLET | Freq: Every day | ORAL | Status: DC

## 2012-03-16 NOTE — Addendum Note (Signed)
Addended by: Scharlene Gloss B on: 03/16/2012 11:51 AM   Modules accepted: Orders

## 2012-03-17 ENCOUNTER — Other Ambulatory Visit: Payer: Self-pay | Admitting: *Deleted

## 2012-03-17 MED ORDER — HYDROCHLOROTHIAZIDE 25 MG PO TABS: 25.0000 mg | ORAL_TABLET | Freq: Every day | ORAL | Status: DC

## 2012-03-17 NOTE — Telephone Encounter (Signed)
R'cd fax from [facility] for refill of HCTZ.

## 2012-03-19 ENCOUNTER — Telehealth: Payer: Self-pay

## 2012-03-19 NOTE — Telephone Encounter (Signed)
A user error has taken place: encounter opened in error, closed for administrative reasons.

## 2012-05-21 ENCOUNTER — Other Ambulatory Visit: Payer: Self-pay | Admitting: Internal Medicine

## 2012-05-23 ENCOUNTER — Other Ambulatory Visit: Payer: Self-pay | Admitting: Internal Medicine

## 2012-05-29 ENCOUNTER — Other Ambulatory Visit: Payer: Self-pay | Admitting: Internal Medicine

## 2012-06-05 ENCOUNTER — Telehealth: Payer: Self-pay | Admitting: Internal Medicine

## 2012-06-05 ENCOUNTER — Ambulatory Visit: Payer: 59 | Admitting: Internal Medicine

## 2012-06-05 MED ORDER — AMLODIPINE BESYLATE 10 MG PO TABS: 10.0000 mg | ORAL_TABLET | Freq: Every day | ORAL | Status: DC

## 2012-06-05 NOTE — Telephone Encounter (Signed)
Called the patient informed of MD instructions on Amlodipine.  The patient is confused as to what she is taking.  She  Currently has at home and taking, HCTZ 25, Avapro, Atorvastatin, Atenolol, Glimepiride, Klor Con and Janumet.  Please advise on correct meds. To [facility] [address]

## 2012-06-05 NOTE — Telephone Encounter (Signed)
Please determine pt current med list with strengths, ok to cont the amlodpine as she has been taking, but please ask pt to make OV

## 2012-06-05 NOTE — Telephone Encounter (Signed)
Please clarify, as she is supposed to be on labetolol (not atenolol)

## 2012-06-05 NOTE — Telephone Encounter (Signed)
Patient says the pharmacy faxed over for a refill on amlodipine and they are still waiting for a reply, she is wanting to know what the hold up is

## 2012-06-05 NOTE — Telephone Encounter (Signed)
Ok to adjust med list to have only these meds, will see pt back in mar 2014 as scheduled

## 2012-06-05 NOTE — Telephone Encounter (Signed)
Janumet 50/1000 mg, Klor Con 10 mg, Amlodipine 10 mg, Glimepiride 2 mg, Irbesartan 300 mg,  Atorvastatin 20 mg, Atenolol 50 mg, HCTZ 25 mg list of meds taking.   BP  Has been an average of 128/70.  The patient wanted you to know she has been walking everyday.

## 2012-06-05 NOTE — Telephone Encounter (Signed)
List corrected and sent in amlodipine

## 2012-06-05 NOTE — Telephone Encounter (Signed)
Patient is requesting a refill on Amlodipine 10 mg to [facility] [address], this medication is not on current list.

## 2012-06-05 NOTE — Telephone Encounter (Signed)
The amlodipine was stopped at last visit aug 2013

## 2012-07-10 ENCOUNTER — Ambulatory Visit: Payer: 59 | Admitting: Internal Medicine

## 2012-07-26 ENCOUNTER — Other Ambulatory Visit: Payer: Self-pay | Admitting: Internal Medicine

## 2012-08-07 ENCOUNTER — Ambulatory Visit: Payer: 59 | Admitting: Internal Medicine

## 2012-08-07 ENCOUNTER — Other Ambulatory Visit: Payer: Self-pay | Admitting: Internal Medicine

## 2012-09-11 ENCOUNTER — Ambulatory Visit: Payer: 59 | Admitting: Internal Medicine

## 2012-10-07 ENCOUNTER — Encounter: Payer: Self-pay | Admitting: Internal Medicine

## 2012-10-07 LAB — HM MAMMOGRAPHY: HM Mammogram: NEGATIVE

## 2012-10-09 ENCOUNTER — Ambulatory Visit: Payer: 59 | Admitting: Internal Medicine

## 2012-10-30 ENCOUNTER — Ambulatory Visit: Payer: 59 | Admitting: Internal Medicine

## 2012-11-12 ENCOUNTER — Ambulatory Visit: Payer: 59 | Admitting: Internal Medicine

## 2012-12-11 ENCOUNTER — Ambulatory Visit: Payer: 59 | Admitting: Internal Medicine

## 2012-12-15 ENCOUNTER — Other Ambulatory Visit: Payer: Self-pay | Admitting: Internal Medicine

## 2012-12-27 ENCOUNTER — Other Ambulatory Visit: Payer: Self-pay | Admitting: Internal Medicine

## 2013-01-01 ENCOUNTER — Ambulatory Visit: Payer: 59 | Admitting: Internal Medicine

## 2013-01-24 ENCOUNTER — Other Ambulatory Visit: Payer: Self-pay | Admitting: Internal Medicine

## 2013-01-26 ENCOUNTER — Other Ambulatory Visit (INDEPENDENT_AMBULATORY_CARE_PROVIDER_SITE_OTHER): Payer: 59

## 2013-01-26 ENCOUNTER — Telehealth: Payer: Self-pay

## 2013-01-26 ENCOUNTER — Ambulatory Visit: Payer: 59

## 2013-01-26 DIAGNOSIS — R7309 Other abnormal glucose: Secondary | ICD-10-CM

## 2013-01-26 DIAGNOSIS — Z0000 Encounter for general adult medical examination without abnormal findings: Secondary | ICD-10-CM

## 2013-01-26 LAB — URINALYSIS, ROUTINE W REFLEX MICROSCOPIC
Nitrite: NEGATIVE
Total Protein, Urine: NEGATIVE
pH: 5.5 (ref 5.0–8.0)

## 2013-01-26 LAB — HEPATIC FUNCTION PANEL
ALT: 16 U/L (ref 0–35)
Alkaline Phosphatase: 61 U/L (ref 39–117)
Bilirubin, Direct: 0.1 mg/dL (ref 0.0–0.3)
Total Bilirubin: 1 mg/dL (ref 0.3–1.2)

## 2013-01-26 LAB — CBC WITH DIFFERENTIAL/PLATELET
Eosinophils Relative: 0.4 % (ref 0.0–5.0)
HCT: 37.5 % (ref 36.0–46.0)
Hemoglobin: 12.2 g/dL (ref 12.0–15.0)
Lymphs Abs: 1.2 10*3/uL (ref 0.7–4.0)
MCV: 81.4 fl (ref 78.0–100.0)
Monocytes Absolute: 0.4 10*3/uL (ref 0.1–1.0)
Monocytes Relative: 7.3 % (ref 3.0–12.0)
Neutro Abs: 3.2 10*3/uL (ref 1.4–7.7)
Platelets: 280 10*3/uL (ref 150.0–400.0)
RDW: 13.9 % (ref 11.5–14.6)
WBC: 4.8 10*3/uL (ref 4.5–10.5)

## 2013-01-26 LAB — LIPID PANEL
Cholesterol: 151 mg/dL (ref 0–200)
HDL: 42.8 mg/dL (ref >39.00)
Total CHOL/HDL Ratio: 4

## 2013-01-26 LAB — BASIC METABOLIC PANEL
BUN: 12 mg/dL (ref 6–23)
Chloride: 102 mEq/L (ref 96–112)
Creatinine, Ser: 0.4 mg/dL (ref 0.4–1.2)
GFR: 159.57 mL/min (ref >60.00)
Sodium: 137 mEq/L (ref 135–145)

## 2013-01-26 NOTE — Telephone Encounter (Signed)
Labs entered.

## 2013-01-29 ENCOUNTER — Ambulatory Visit (INDEPENDENT_AMBULATORY_CARE_PROVIDER_SITE_OTHER): Payer: 59 | Admitting: Internal Medicine

## 2013-01-29 ENCOUNTER — Encounter: Payer: Self-pay | Admitting: Internal Medicine

## 2013-01-29 VITALS — BP 160/90 | HR 94 | Temp 97.5°F | Ht 65.0 in | Wt 170.5 lb

## 2013-01-29 DIAGNOSIS — Z23 Encounter for immunization: Secondary | ICD-10-CM

## 2013-01-29 DIAGNOSIS — Z0000 Encounter for general adult medical examination without abnormal findings: Secondary | ICD-10-CM

## 2013-01-29 DIAGNOSIS — I10 Essential (primary) hypertension: Secondary | ICD-10-CM

## 2013-01-29 DIAGNOSIS — IMO0001 Reserved for inherently not codable concepts without codable children: Secondary | ICD-10-CM

## 2013-01-29 DIAGNOSIS — E119 Type 2 diabetes mellitus without complications: Secondary | ICD-10-CM

## 2013-01-29 MED ORDER — GLIPIZIDE ER 10 MG PO TB24: 10.0000 mg | ORAL_TABLET | Freq: Every day | ORAL | Status: DC

## 2013-01-29 NOTE — Patient Instructions (Addendum)
You had the Prevnar pneumonia shot today OK to stop the glimeparide for sugar since it is not working well Please take all new medication as prescribed - the glipizide ER 10 mg per day OK to increase the atenolol (tenormin) to 100 mg per day for blood pressure Please continue all other medications as before, and refills have been done if requested. Please have the pharmacy call with any other refills you may need. You will be contacted regarding the referral for: Diabetes Education class You are given the copy of your lab work today Please continue your efforts at being more active, low cholesterol diabetic diet, and weight control. You are otherwise up to date with prevention measures today. Please keep your appointments with your specialists as you may have planned  Please remember to sign up for My Chart if you have not done so, as this will be important to you in the future with finding out test results, communicating by private email, and scheduling acute appointments online when needed.  Please return in 6 months, or sooner if needed, with Lab testing done 3-5 days before

## 2013-01-29 NOTE — Assessment & Plan Note (Signed)

## 2013-01-29 NOTE — Addendum Note (Signed)
Addended by: Scharlene Gloss B on: 01/29/2013 03:59 PM   Modules accepted: Orders

## 2013-01-29 NOTE — Progress Notes (Signed)
Subjective:    Patient ID: Karen Good, female    DOB: [birth year], 57 y.o.   MRN: [id number]  HPI  Here for wellness and f/u;  Overall doing ok;  Pt denies CP, worsening SOB, DOE, wheezing, orthopnea, PND, worsening LE edema, palpitations, dizziness or syncope.  Pt denies neurological change such as new headache, facial or extremity weakness.  Pt denies polydipsia, polyuria, or low sugar symptoms. Pt states overall good compliance with treatment and medications, good tolerability, and has been trying to follow lower cholesterol diet.  Pt denies worsening depressive symptoms, suicidal ideation or panic. No fever, night sweats, wt loss, loss of appetite, or other constitutional symptoms.  Pt states good ability with ADL's, has low fall risk, home safety reviewed and adequate, no other significant changes in hearing or vision, and only occasionally active with exercise.  States diet could be better, has not been to DM education class.  Overall good compliance with treatment, and good medicine tolerability. Past Medical History  Diagnosis Date  . BACK PAIN 04/13/2010  . BACK STRAIN, LUMBAR 04/13/2010  . DIABETES MELLITUS, TYPE II 06/02/2008  . HYPERLIPIDEMIA 06/02/2008  . HYPERTENSION 06/02/2008   Past Surgical History  Procedure Laterality Date  . S/p breast biopsy  4/07    benign    reports that she has never smoked. She does not have any smokeless tobacco history on file. She reports that she does not drink alcohol or use illicit drugs. family history includes Diabetes in her father, mother, and other; Heart disease in her father; Hypertension in her father. No Known Allergies Current Outpatient Prescriptions on File Prior to Visit  Medication Sig Dispense Refill  . amLODipine (NORVASC) 10 MG tablet Take 1 tablet (10 mg total) by mouth daily.  30 tablet  11  . aspirin 81 MG tablet Take 81 mg by mouth daily.        Marland Kitchen atenolol (TENORMIN) 50 MG tablet TAKE 1 TABLET DAILY  90 tablet  0  .  atorvastatin (LIPITOR) 20 MG tablet TAKE 1 TABLET DAILY  90 tablet  0  . hydrochlorothiazide (HYDRODIURIL) 25 MG tablet TAKE 1 TABLET DAILY  90 tablet  0  . irbesartan (AVAPRO) 300 MG tablet TAKE 1 TABLET AT BEDTIME  90 tablet  0  . JANUMET 50-1000 MG per tablet TAKE 1 TABLET TWICE A DAY  180 tablet  0  . KLOR-CON M10 10 MEQ tablet TAKE 1 TABLET DAILY  90 tablet  1  . irbesartan (AVAPRO) 300 MG tablet Take 1 tablet (300 mg total) by mouth daily.  90 tablet  3  . potassium chloride (KLOR-CON 10) 10 MEQ CR tablet Take 1 tablet (10 mEq total) by mouth daily.  90 tablet  3  . [DISCONTINUED] nebivolol (BYSTOLIC) 10 MG tablet Take 1 tablet (10 mg total) by mouth daily.  90 tablet  3  . [DISCONTINUED] valsartan (DIOVAN) 320 MG tablet Take 1 tablet (320 mg total) by mouth daily.  90 tablet  3   No current facility-administered medications on file prior to visit.   Review of Systems Constitutional: Negative for diaphoresis, activity change, appetite change or unexpected weight change.  HENT: Negative for hearing loss, ear pain, facial swelling, mouth sores and neck stiffness.   Eyes: Negative for pain, redness and visual disturbance.  Respiratory: Negative for shortness of breath and wheezing.   Cardiovascular: Negative for chest pain and palpitations.  Gastrointestinal: Negative for diarrhea, blood in stool, abdominal distention or other pain Genitourinary: Negative  for hematuria, flank pain or change in urine volume.  Musculoskeletal: Negative for myalgias and joint swelling.  Skin: Negative for color change and wound.  Neurological: Negative for syncope and numbness. other than noted Hematological: Negative for adenopathy.  Psychiatric/Behavioral: Negative for hallucinations, self-injury, decreased concentration and agitation.      Objective:   Physical Exam BP 160/90  Pulse 94  Temp(Src) 97.5 F (36.4 C) (Oral)  Ht 5\' 5"  (1.651 m)  Wt 170 lb 8 oz (77.338 kg)  BMI 28.37 kg/m2  SpO2  95% VS noted,  Constitutional: Pt is oriented to person, place, and time. Appears well-developed and well-nourished.  Head: Normocephalic and atraumatic.  Right Ear: External ear normal.  Left Ear: External ear normal.  Nose: Nose normal.  Mouth/Throat: Oropharynx is clear and moist.  Eyes: Conjunctivae and EOM are normal. Pupils are equal, round, and reactive to light.  Neck: Normal range of motion. Neck supple. No JVD present. No tracheal deviation present.  Cardiovascular: Normal rate, regular rhythm, normal heart sounds and intact distal pulses.   Pulmonary/Chest: Effort normal and breath sounds normal.  Abdominal: Soft. Bowel sounds are normal. There is no tenderness. No HSM  Musculoskeletal: Normal range of motion. Exhibits no edema.  Lymphadenopathy:  Has no cervical adenopathy.  Neurological: Pt is alert and oriented to person, place, and time. Pt has normal reflexes. No cranial nerve deficit.  Skin: Skin is warm and dry. No rash noted.  Psychiatric:  Has  normal mood and affect. Behavior is normal.      Assessment & Plan:

## 2013-01-29 NOTE — Assessment & Plan Note (Signed)
Uncontrolled, for better diet, refer DM education, change gliimeparide 2 mg to the glipizide 10 ER qd, f/u lab next visit

## 2013-01-29 NOTE — Assessment & Plan Note (Signed)
To increase the atenolol to 100 qd, cont all other meds,  to f/u any worsening symptoms or concerns and BP at home and next viist

## 2013-02-10 ENCOUNTER — Other Ambulatory Visit: Payer: Self-pay | Admitting: Internal Medicine

## 2013-03-14 ENCOUNTER — Other Ambulatory Visit: Payer: Self-pay | Admitting: Internal Medicine

## 2013-03-15 ENCOUNTER — Other Ambulatory Visit: Payer: Self-pay | Admitting: *Deleted

## 2013-03-15 MED ORDER — ATENOLOL 50 MG PO TABS: ORAL_TABLET | ORAL | Status: DC

## 2013-03-17 ENCOUNTER — Telehealth: Payer: Self-pay

## 2013-03-17 MED ORDER — ATENOLOL 100 MG PO TABS: 100.0000 mg | ORAL_TABLET | Freq: Every day | ORAL | Status: DC

## 2013-03-17 NOTE — Telephone Encounter (Signed)
Done per emr 

## 2013-03-17 NOTE — Telephone Encounter (Signed)
The patients atenolol was recently refilled as 50 mg. ,which is what is on the medication list.  The patient states at the last OV PCP doubled what she was taking.  She is out and pharmacy will not refill as states based on taking 50 mg would be too soon.  Advise please.

## 2013-03-18 NOTE — Telephone Encounter (Signed)
Called the patient left a detailed message of MD instructions. 

## 2013-03-21 ENCOUNTER — Other Ambulatory Visit: Payer: Self-pay | Admitting: Internal Medicine

## 2013-05-12 ENCOUNTER — Other Ambulatory Visit: Payer: Self-pay | Admitting: Internal Medicine

## 2013-05-25 ENCOUNTER — Encounter: Payer: Self-pay | Admitting: Internal Medicine

## 2013-06-13 ENCOUNTER — Other Ambulatory Visit: Payer: Self-pay | Admitting: Internal Medicine

## 2013-08-06 ENCOUNTER — Ambulatory Visit: Payer: 59 | Admitting: Internal Medicine

## 2013-09-03 ENCOUNTER — Ambulatory Visit: Payer: 59 | Admitting: Internal Medicine

## 2013-09-03 DIAGNOSIS — Z0289 Encounter for other administrative examinations: Secondary | ICD-10-CM

## 2013-09-07 ENCOUNTER — Telehealth: Payer: Self-pay | Admitting: *Deleted

## 2013-09-07 MED ORDER — GLIPIZIDE ER 5 MG PO TB24: ORAL_TABLET | ORAL | Status: DC

## 2013-09-07 NOTE — Telephone Encounter (Signed)
Done erx 

## 2013-09-07 NOTE — Telephone Encounter (Signed)
Pt called requesting Glipizide 10mg  24Hr #30 be changed to Glipizide 5mg  #60. Pt is able to get the 5mg  at a much lesser price than the 10mg .  Please advise

## 2013-09-08 MED ORDER — GLIPIZIDE ER 5 MG PO TB24: ORAL_TABLET | ORAL | Status: DC

## 2013-09-08 NOTE — Telephone Encounter (Signed)
Patient informed and resent to Kristopher Oppenheim at [place] at the patients request.

## 2013-10-08 ENCOUNTER — Ambulatory Visit: Payer: 59 | Admitting: Internal Medicine

## 2013-10-19 ENCOUNTER — Telehealth: Payer: Self-pay

## 2013-10-19 DIAGNOSIS — E119 Type 2 diabetes mellitus without complications: Secondary | ICD-10-CM

## 2013-10-19 NOTE — Telephone Encounter (Signed)
Diabetic bundle-a1c and bmet ordered 

## 2013-11-12 ENCOUNTER — Ambulatory Visit: Payer: 59 | Admitting: Internal Medicine

## 2013-11-17 ENCOUNTER — Encounter: Payer: Self-pay | Admitting: Internal Medicine

## 2013-12-10 ENCOUNTER — Ambulatory Visit: Payer: 59 | Admitting: Internal Medicine

## 2014-01-07 ENCOUNTER — Ambulatory Visit: Payer: 59 | Admitting: Internal Medicine

## 2014-01-13 ENCOUNTER — Other Ambulatory Visit: Payer: Self-pay | Admitting: Internal Medicine

## 2014-02-09 ENCOUNTER — Emergency Department (HOSPITAL_COMMUNITY)
Admission: EM | Admit: 2014-02-09 | Discharge: 2014-02-09 | Disposition: A | Discharge status: Home / Self Care | Payer: 59 | Source: Home / Self Care | Attending: Emergency Medicine | Admitting: Emergency Medicine

## 2014-02-09 ENCOUNTER — Encounter (HOSPITAL_COMMUNITY): Payer: Self-pay | Admitting: Emergency Medicine

## 2014-02-09 ENCOUNTER — Other Ambulatory Visit: Payer: Self-pay

## 2014-02-09 DIAGNOSIS — R000 Tachycardia, unspecified: Secondary | ICD-10-CM

## 2014-02-09 DIAGNOSIS — K047 Periapical abscess without sinus: Secondary | ICD-10-CM

## 2014-02-09 MED ORDER — HYDROCODONE-ACETAMINOPHEN 5-325 MG PO TABS: 1.0000 | ORAL_TABLET | Freq: Four times a day (QID) | ORAL | Status: DC | PRN

## 2014-02-09 MED ORDER — PENICILLIN V POTASSIUM 500 MG PO TABS: 500.0000 mg | ORAL_TABLET | Freq: Four times a day (QID) | ORAL | Status: AC

## 2014-02-09 NOTE — Discharge Instructions (Signed)
You have a dental abscess. Take penicillin 4 times a day for 10 days. Use ibuprofen 600mg  every 8 hours. Use the norco every 4-6 hours as needed for severe pain.  Do not drive while taking this medicine. Follow up with a dentist as soon as possible.  Make sure you follow up with your regular doctor about your blood pressure.  Abscessed Tooth An abscessed tooth is an infection around your tooth. It may be caused by holes or damage to the tooth (cavity) or a dental disease. An abscessed tooth causes mild to very bad pain in and around the tooth. See your dentist right away if you have tooth or gum pain. HOME CARE  Take your medicine as told. Finish it even if you start to feel better.  Do not drive after taking pain medicine.  Rinse your mouth (gargle) often with salt water ( teaspoon salt in 8 ounces of warm water).  Do not apply heat to the outside of your face. GET HELP RIGHT AWAY IF:   You have a temperature by mouth above 102 F (38.9 C), not controlled by medicine.  You have chills and a very bad headache.  You have problems breathing or swallowing.  Your mouth will not open.  You develop puffiness (swelling) on the neck or around the eye.  Your pain is not helped by medicine.  Your pain is getting worse instead of better. MAKE SURE YOU:   Understand these instructions.  Will watch your condition.  Will get help right away if you are not doing well or get worse. Document Released: 09/18/2007 Document Revised: 06/24/2011 Document Reviewed: 07/10/2010 [facility] Patient Information 2015 [place], [place]. This information is not intended to replace advice given to you by your health care provider. Make sure you discuss any questions you have with your health care provider.

## 2014-02-09 NOTE — ED Provider Notes (Signed)
CSN: [id number]     Arrival date & time 02/09/14  1723 History   First MD Initiated Contact with Patient 02/09/14 1759     Chief Complaint  Patient presents with  . Dental Pain   (Consider location/radiation/quality/duration/timing/severity/associated sxs/prior Treatment) HPI She is a 58 year old woman here for evaluation of dental pain. She states her symptoms started about 3 days ago with pain in the right upper jaw. She can feel a lump there. She also states her right ear has some pain in it. She denies any fevers or chills. No nausea or vomiting. No pain with jaw opening. She is tolerating food well. She is in the process of looking for a dentist.  She does state she can feel her heart racing. She denies any chest pain or shortness of breath. No dizziness.  Past Medical History  Diagnosis Date  . BACK PAIN 04/13/2010  . BACK STRAIN, LUMBAR 04/13/2010  . DIABETES MELLITUS, TYPE II 06/02/2008  . HYPERLIPIDEMIA 06/02/2008  . HYPERTENSION 06/02/2008   Past Surgical History  Procedure Laterality Date  . S/p breast biopsy  4/07    benign   Family History  Problem Relation Age of Onset  . Diabetes Mother   . Heart disease Father   . Diabetes Father   . Hypertension Father   . Diabetes Other    History  Substance Use Topics  . Smoking status: Never Smoker   . Smokeless tobacco: Not on file  . Alcohol Use: No   OB History   Grav Para Term Preterm Abortions TAB SAB Ect Mult Living                 Review of Systems  Constitutional: Negative for fever and chills.  HENT: Positive for dental problem.   Respiratory: Negative for shortness of breath.   Cardiovascular: Positive for palpitations. Negative for chest pain.  Neurological: Negative for dizziness.    Allergies  Review of patient's allergies indicates no known allergies.  Home Medications   Prior to Admission medications   Medication Sig Start Date End Date Taking? Authorizing Provider  amLODipine (NORVASC) 10  MG tablet TAKE 1 TABLET BY MOUTH EVERY DAY   Yes Biagio Borg, MD  aspirin 81 MG tablet Take 81 mg by mouth daily.     Yes Historical Provider, MD  atenolol (TENORMIN) 100 MG tablet Take 1 tablet (100 mg total) by mouth daily. 03/17/13  Yes Biagio Borg, MD  atorvastatin (LIPITOR) 20 MG tablet TAKE 1 TABLET DAILY 03/14/13  Yes Biagio Borg, MD  glipiZIDE (GLUCOTROL XL) 5 MG 24 hr tablet 2 tabs by mouth per day 09/08/13  Yes Biagio Borg, MD  hydrochlorothiazide (HYDRODIURIL) 25 MG tablet TAKE 1 TABLET DAILY (OVERDUE FOR YEARLY PHYSICAL, MUST SEE MD BEFORE ADDITIONAL REFILLS) 03/21/13  Yes Biagio Borg, MD  irbesartan (AVAPRO) 300 MG tablet TAKE 1 TABLET AT BEDTIME 05/12/13  Yes Biagio Borg, MD  JANUMET 50-1000 MG per tablet TAKE 1 TABLET TWICE A DAY 03/21/13  Yes Biagio Borg, MD  KLOR-CON M10 10 MEQ tablet TAKE 1 TABLET DAILY 01/13/14  Yes Biagio Borg, MD  HYDROcodone-acetaminophen (NORCO) 5-325 MG per tablet Take 1 tablet by mouth every 6 (six) hours as needed for moderate pain. 02/09/14   Melony Overly, MD  irbesartan (AVAPRO) 300 MG tablet Take 1 tablet (300 mg total) by mouth daily. 11/22/11 11/21/12  Biagio Borg, MD  penicillin v potassium (VEETID) 500 MG tablet Take  1 tablet (500 mg total) by mouth 4 (four) times daily. 02/09/14 02/16/14  Melony Overly, MD  potassium chloride (KLOR-CON 10) 10 MEQ CR tablet Take 1 tablet (10 mEq total) by mouth daily. 07/09/10 07/09/11  Biagio Borg, MD   BP 180/87  Pulse 123  Temp(Src) 97.7 F (36.5 C) (Oral)  Resp 16 Physical Exam  Constitutional: She is oriented to person, place, and time. She appears well-developed and well-nourished. No distress.  HENT:  Head: Normocephalic and atraumatic.  Right Ear: Tympanic membrane and external ear normal.  Left Ear: External ear normal.  Mouth/Throat: Mucous membranes are not dry. Abnormal dentition (multiple broken teeth).    Cardiovascular: Regular rhythm, normal heart sounds and intact distal pulses.  Tachycardia  present.   No murmur heard. Pulmonary/Chest: Effort normal.  Neurological: She is alert and oriented to person, place, and time.  Skin: Skin is warm and dry.    ED Course  EKG  Date/Time: 02/09/2014 6:35 PM Performed by: Melony Overly Authorized by: Melony Overly Interpreted by ED physician Rhythm: sinus tachycardia Rate: tachycardic QRS axis: normal Conduction: conduction normal ST Segments: ST segments normal T Waves: T waves normal Clinical impression: normal ECG Comments: Sinus tachycardia   (including critical care time) Labs Review Labs Reviewed - No data to display  Imaging Review No results found.   MDM   1. Dental abscess   2. Tachycardia    We'll treat abscess with penicillin for 10 days. Recommended ibuprofen 600 mg 3 times a day for pain. Prescription for Norco provided to use as needed for severe pain. Will need follow-up with a dentist as soon as possible.  Tachycardia is sinus tachycardia. This is likely secondary to her infection.  Her blood pressure is quite elevated. She states she has an appointment with her primary care physician in 2 weeks to follow-up her blood pressure.    Melony Overly, MD 02/09/14 [phone]

## 2014-02-09 NOTE — ED Notes (Signed)
C/o R upper jaw toothache onset 3 days ago.  She thinks she has an abscess.  In the process of getting a new dentist.

## 2014-02-18 ENCOUNTER — Ambulatory Visit: Payer: 59 | Admitting: Internal Medicine

## 2014-03-13 ENCOUNTER — Other Ambulatory Visit: Payer: Self-pay | Admitting: Internal Medicine

## 2014-03-18 ENCOUNTER — Ambulatory Visit: Payer: 59 | Admitting: Internal Medicine

## 2014-03-21 ENCOUNTER — Other Ambulatory Visit: Payer: Self-pay | Admitting: Internal Medicine

## 2014-04-01 ENCOUNTER — Other Ambulatory Visit: Payer: Self-pay | Admitting: Internal Medicine

## 2014-04-13 ENCOUNTER — Ambulatory Visit: Payer: 59 | Admitting: Internal Medicine

## 2014-05-13 ENCOUNTER — Other Ambulatory Visit: Payer: Self-pay | Admitting: Internal Medicine

## 2014-05-13 ENCOUNTER — Ambulatory Visit: Payer: 59 | Admitting: Internal Medicine

## 2014-05-15 ENCOUNTER — Other Ambulatory Visit: Payer: Self-pay | Admitting: Internal Medicine

## 2014-06-10 ENCOUNTER — Ambulatory Visit: Payer: Self-pay | Admitting: Internal Medicine

## 2014-06-12 ENCOUNTER — Other Ambulatory Visit: Payer: Self-pay | Admitting: Internal Medicine

## 2014-06-17 ENCOUNTER — Telehealth: Payer: Self-pay | Admitting: Internal Medicine

## 2014-06-17 NOTE — Telephone Encounter (Signed)
Routine refill to [facility] per office policy

## 2014-06-17 NOTE — Telephone Encounter (Signed)
Patient had a CPE scheduled for April. Asking for a 1 month refill of   JANUMET 50-1000 MG per tablet [[id number]]      And  KLOR-CON M10 10 MEQ tablet [[id number]]      In the meantime. Please advise patient.

## 2014-06-20 MED ORDER — POTASSIUM CHLORIDE ER 10 MEQ PO TBCR: 10.0000 meq | EXTENDED_RELEASE_TABLET | Freq: Every day | ORAL | Status: DC

## 2014-06-20 MED ORDER — SITAGLIPTIN PHOS-METFORMIN HCL 50-1000 MG PO TABS: 1.0000 | ORAL_TABLET | Freq: Two times a day (BID) | ORAL | Status: DC

## 2014-06-20 NOTE — Telephone Encounter (Signed)
Called pt to verify pharmacy pt use [facility] inform pt will send 30 day until appt...Karen Good

## 2014-07-04 ENCOUNTER — Telehealth: Payer: Self-pay | Admitting: Internal Medicine

## 2014-07-06 NOTE — Telephone Encounter (Signed)
OK 1 month on each med. OV w/Dr Jenny Reichmann w/in 30 d Thx

## 2014-07-06 NOTE — Telephone Encounter (Signed)
Pt called in and needs to know if she can get a 10 day supply of both these meds called in because she is out .  She has a fu on 4/1

## 2014-07-11 ENCOUNTER — Other Ambulatory Visit (INDEPENDENT_AMBULATORY_CARE_PROVIDER_SITE_OTHER): Payer: Self-pay

## 2014-07-11 DIAGNOSIS — E119 Type 2 diabetes mellitus without complications: Secondary | ICD-10-CM

## 2014-07-11 LAB — BASIC METABOLIC PANEL
BUN: 10 mg/dL (ref 6–23)
CHLORIDE: 102 meq/L (ref 96–112)
CO2: 29 mEq/L (ref 19–32)
Calcium: 9.3 mg/dL (ref 8.4–10.5)
Creatinine, Ser: 0.5 mg/dL (ref 0.40–1.20)
GFR: 133.42 mL/min (ref >60.00)
Glucose, Bld: 175 mg/dL — ABNORMAL HIGH (ref 70–99)
Potassium: 3.8 mEq/L (ref 3.5–5.1)
SODIUM: 137 meq/L (ref 135–145)

## 2014-07-11 LAB — HEMOGLOBIN A1C: Hgb A1c MFr Bld: 10 % — ABNORMAL HIGH (ref 4.6–6.5)

## 2014-07-15 ENCOUNTER — Ambulatory Visit (INDEPENDENT_AMBULATORY_CARE_PROVIDER_SITE_OTHER): Payer: 59 | Admitting: Internal Medicine

## 2014-07-15 ENCOUNTER — Encounter: Payer: Self-pay | Admitting: Internal Medicine

## 2014-07-15 VITALS — BP 146/88 | HR 103 | Temp 98.0°F | Resp 18 | Ht 65.0 in | Wt 162.1 lb

## 2014-07-15 DIAGNOSIS — E119 Type 2 diabetes mellitus without complications: Secondary | ICD-10-CM

## 2014-07-15 DIAGNOSIS — L989 Disorder of the skin and subcutaneous tissue, unspecified: Secondary | ICD-10-CM

## 2014-07-15 DIAGNOSIS — Z0000 Encounter for general adult medical examination without abnormal findings: Secondary | ICD-10-CM | POA: Diagnosis not present

## 2014-07-15 MED ORDER — PIOGLITAZONE HCL 45 MG PO TABS: 45.0000 mg | ORAL_TABLET | Freq: Every day | ORAL | Status: DC

## 2014-07-15 MED ORDER — GLIPIZIDE ER 10 MG PO TB24: 10.0000 mg | ORAL_TABLET | Freq: Every day | ORAL | Status: DC

## 2014-07-15 MED ORDER — AMLODIPINE BESYLATE 10 MG PO TABS: 10.0000 mg | ORAL_TABLET | Freq: Every day | ORAL | Status: DC

## 2014-07-15 MED ORDER — HYDROCHLOROTHIAZIDE 25 MG PO TABS: 25.0000 mg | ORAL_TABLET | Freq: Every day | ORAL | Status: DC

## 2014-07-15 NOTE — Assessment & Plan Note (Signed)

## 2014-07-15 NOTE — Patient Instructions (Signed)
OK to increase the Glipizide ER to 10 mg per day  Please take all new medication as prescribed - the actos (generic) at 45 mg per day  Please continue all other medications as before, and refills have been done if requested.  Please have the pharmacy call with any other refills you may need.  Please continue your efforts at being more active, low cholesterol diet, and weight control.  You are otherwise up to date with prevention measures today.  Please keep your appointments with your specialists as you may have planned  You will be contacted regarding the referral for: Dermatology  Please go to the LAB in the Basement (turn left off the elevator) for the tests to be done today  You will be contacted by phone if any changes need to be made immediately.  Otherwise, you will receive a letter about your results with an explanation, but please check with MyChart first.  Please remember to sign up for MyChart if you have not done so, as this will be important to you in the future with finding out test results, communicating by private email, and scheduling acute appointments online when needed.  Please return in 6 months, or sooner if needed, with Lab testing done 3-5 days before

## 2014-07-15 NOTE — Progress Notes (Signed)
Subjective:    Patient ID: Karen Good, female    DOB: [birth year], 59 y.o.   MRN: [id number]  HPI  Here for wellness and f/u;  Overall doing ok;  Pt denies Chest pain, worsening SOB, DOE, wheezing, orthopnea, PND, worsening LE edema, palpitations, dizziness or syncope.  Pt denies neurological change such as new headache, facial or extremity weakness.  Pt denies polydipsia, polyuria, or low sugar symptoms. Pt states overall good compliance with treatment and medications, good tolerability, and has been trying to follow appropriate diet.  Pt denies worsening depressive symptoms, suicidal ideation or panic. No fever, night sweats, wt loss, loss of appetite, or other constitutional symptoms.  Pt states good ability with ADL's, has low fall risk, home safety reviewed and adequate, no other significant changes in hearing or vision, and only occasionally active with exercise. Declines insulin start today. Past Medical History  Diagnosis Date  . BACK PAIN 04/13/2010  . BACK STRAIN, LUMBAR 04/13/2010  . DIABETES MELLITUS, TYPE II 06/02/2008  . HYPERLIPIDEMIA 06/02/2008  . HYPERTENSION 06/02/2008   Past Surgical History  Procedure Laterality Date  . S/p breast biopsy  4/07    benign    reports that she has never smoked. She does not have any smokeless tobacco history on file. She reports that she does not drink alcohol or use illicit drugs. family history includes Diabetes in her father, mother, and other; Heart disease in her father; Hypertension in her father. No Known Allergies Current Outpatient Prescriptions on File Prior to Visit  Medication Sig Dispense Refill  . aspirin 81 MG tablet Take 81 mg by mouth daily.      Marland Kitchen atenolol (TENORMIN) 100 MG tablet TAKE 1 TABLET (100 MG TOTAL) BY MOUTH DAILY. * INSURANCE WILL ONLY COVER 30 DAY* 90 tablet 1  . atorvastatin (LIPITOR) 20 MG tablet TAKE 1 TABLET DAILY 90 tablet 0  . glipiZIDE (GLUCOTROL XL) 5 MG 24 hr tablet 2 tabs by mouth per day 180  tablet 3  . HYDROcodone-acetaminophen (NORCO) 5-325 MG per tablet Take 1 tablet by mouth every 6 (six) hours as needed for moderate pain. 20 tablet 0  . irbesartan (AVAPRO) 300 MG tablet TAKE 1 TABLET AT BEDTIME 90 tablet 3  . KLOR-CON M10 10 MEQ tablet TAKE 1 TABLET DAILY 90 tablet 0  . potassium chloride (K-DUR) 10 MEQ tablet Take 1 tablet (10 mEq total) by mouth daily. 30 tablet 0  . sitaGLIPtin-metformin (JANUMET) 50-1000 MG per tablet Take 1 tablet by mouth 2 (two) times daily. 60 tablet 0  . irbesartan (AVAPRO) 300 MG tablet Take 1 tablet (300 mg total) by mouth daily. 90 tablet 3  . potassium chloride (KLOR-CON 10) 10 MEQ CR tablet Take 1 tablet (10 mEq total) by mouth daily. 90 tablet 3  . [DISCONTINUED] nebivolol (BYSTOLIC) 10 MG tablet Take 1 tablet (10 mg total) by mouth daily. 90 tablet 3  . [DISCONTINUED] valsartan (DIOVAN) 320 MG tablet Take 1 tablet (320 mg total) by mouth daily. 90 tablet 3   No current facility-administered medications on file prior to visit.   Review of Systems Constitutional: Negative for increased diaphoresis, other activity, appetite or siginficant weight change other than noted HENT: Negative for worsening hearing loss, ear pain, facial swelling, mouth sores and neck stiffness.   Eyes: Negative for other worsening pain, redness or visual disturbance.  Respiratory: Negative for shortness of breath and wheezing  Cardiovascular: Negative for chest pain and palpitations.  Gastrointestinal: Negative for diarrhea, blood  in stool, abdominal distention or other pain Genitourinary: Negative for hematuria, flank pain or change in urine volume.  Musculoskeletal: Negative for myalgias or other joint complaints.  Skin: Negative for color change and wound or drainage.  Neurological: Negative for syncope and numbness. other than noted Hematological: Negative for adenopathy. or other swelling Psychiatric/Behavioral: Negative for hallucinations, SI, self-injury,  decreased concentration or other worsening agitation.      Objective:   Physical Exam BP 146/88 mmHg  Pulse 103  Temp(Src) 98 F (36.7 C) (Oral)  Resp 18  Ht 5\' 5"  (1.651 m)  Wt 162 lb 1.9 oz (73.537 kg)  BMI 26.98 kg/m2  SpO2 99% VS noted,  Constitutional: Pt is oriented to person, place, and time. Appears well-developed and well-nourished, in no significant distress Head: Normocephalic and atraumatic.  Right Ear: External ear normal.  Left Ear: External ear normal.  Nose: Nose normal.  Mouth/Throat: Oropharynx is clear and moist.  Eyes: Conjunctivae and EOM are normal. Pupils are equal, round, and reactive to light.  Neck: Normal range of motion. Neck supple. No JVD present. No tracheal deviation present or significant neck LA or mass Cardiovascular: Normal rate, regular rhythm, normal heart sounds and intact distal pulses.   Pulmonary/Chest: Effort normal and breath sounds without rales or wheezing  Abdominal: Soft. Bowel sounds are normal. NT. No HSM  Musculoskeletal: Normal range of motion. Exhibits no edema.  Lymphadenopathy:  Has no cervical adenopathy.  Neurological: Pt is alert and oriented to person, place, and time. Pt has normal reflexes. No cranial nerve deficit. Motor grossly intact Skin: Skin is warm and dry. No rash noted. but mult moles and skin tags to neck Psychiatric:  Has normal mood and affect. Behavior is normal.     Assessment & Plan:

## 2014-07-15 NOTE — Assessment & Plan Note (Signed)
Uncontrolled severe, persistent despite good med compliacne, refuses insulin today, to incr the glipizide ER to 10 qd, also add actos 45 qd,  to f/u any worsening symptoms or concerns, fu labs next visit

## 2014-07-15 NOTE — Assessment & Plan Note (Signed)
Refer derm per pt request

## 2014-07-15 NOTE — Progress Notes (Signed)
Pre visit review using our clinic review tool, if applicable. No additional management support is needed unless otherwise documented below in the visit note. 

## 2014-07-19 ENCOUNTER — Other Ambulatory Visit: Payer: Self-pay

## 2014-07-19 ENCOUNTER — Telehealth: Payer: Self-pay | Admitting: Internal Medicine

## 2014-07-19 MED ORDER — ATORVASTATIN CALCIUM 20 MG PO TABS: 20.0000 mg | ORAL_TABLET | Freq: Every day | ORAL | Status: DC

## 2014-07-19 MED ORDER — SITAGLIPTIN PHOS-METFORMIN HCL 50-1000 MG PO TABS: 1.0000 | ORAL_TABLET | Freq: Two times a day (BID) | ORAL | Status: DC

## 2014-07-19 MED ORDER — IRBESARTAN 300 MG PO TABS: 300.0000 mg | ORAL_TABLET | Freq: Every day | ORAL | Status: DC

## 2014-07-19 NOTE — Telephone Encounter (Signed)
Medication was sent over to pharmacy

## 2014-07-19 NOTE — Telephone Encounter (Signed)
Pt called in said that she is completely out of her liptor and needs 5 or 10 day worth to make it til mail order comes in   [facility] [address]   Best number [phone]

## 2014-07-22 ENCOUNTER — Encounter: Payer: Self-pay | Admitting: Internal Medicine

## 2014-07-22 ENCOUNTER — Other Ambulatory Visit (INDEPENDENT_AMBULATORY_CARE_PROVIDER_SITE_OTHER): Payer: 59

## 2014-07-22 DIAGNOSIS — Z0000 Encounter for general adult medical examination without abnormal findings: Secondary | ICD-10-CM | POA: Diagnosis not present

## 2014-07-22 DIAGNOSIS — E119 Type 2 diabetes mellitus without complications: Secondary | ICD-10-CM

## 2014-07-22 LAB — BASIC METABOLIC PANEL
BUN: 13 mg/dL (ref 6–23)
CO2: 30 mEq/L (ref 19–32)
Calcium: 9.6 mg/dL (ref 8.4–10.5)
Chloride: 98 mEq/L (ref 96–112)
Creatinine, Ser: 0.47 mg/dL (ref 0.40–1.20)
GFR: 143.28 mL/min (ref >60.00)
GLUCOSE: 200 mg/dL — AB (ref 70–99)
Potassium: 3.3 mEq/L — ABNORMAL LOW (ref 3.5–5.1)
Sodium: 136 mEq/L (ref 135–145)

## 2014-07-22 LAB — CBC WITH DIFFERENTIAL/PLATELET
Basophils Absolute: 0 10*3/uL (ref 0.0–0.1)
Basophils Relative: 0.5 % (ref 0.0–3.0)
Eosinophils Absolute: 0 10*3/uL (ref 0.0–0.7)
Eosinophils Relative: 0.7 % (ref 0.0–5.0)
HCT: 33.6 % — ABNORMAL LOW (ref 36.0–46.0)
Hemoglobin: 11.2 g/dL — ABNORMAL LOW (ref 12.0–15.0)
Lymphocytes Relative: 28.3 % (ref 12.0–46.0)
Lymphs Abs: 1.4 10*3/uL (ref 0.7–4.0)
MCHC: 33.2 g/dL (ref 30.0–36.0)
MCV: 77.8 fl — ABNORMAL LOW (ref 78.0–100.0)
Monocytes Absolute: 0.6 10*3/uL (ref 0.1–1.0)
Monocytes Relative: 11.2 % (ref 3.0–12.0)
NEUTROS PCT: 59.3 % (ref 43.0–77.0)
Neutro Abs: 2.9 10*3/uL (ref 1.4–7.7)
PLATELETS: 338 10*3/uL (ref 150.0–400.0)
RBC: 4.32 Mil/uL (ref 3.87–5.11)
RDW: 14.7 % (ref 11.5–15.5)
WBC: 4.9 10*3/uL (ref 4.0–10.5)

## 2014-07-22 LAB — URINALYSIS, ROUTINE W REFLEX MICROSCOPIC
Bilirubin Urine: NEGATIVE
Hgb urine dipstick: NEGATIVE
Ketones, ur: NEGATIVE
Leukocytes, UA: NEGATIVE
NITRITE: NEGATIVE
RBC / HPF: NONE SEEN (ref 0–?)
TOTAL PROTEIN, URINE-UPE24: NEGATIVE
Urine Glucose: >=1000 — AB
Urobilinogen, UA: 0.2 (ref 0.0–1.0)
WBC UA: NONE SEEN (ref 0–?)
pH: 5.5 (ref 5.0–8.0)

## 2014-07-22 LAB — LIPID PANEL
CHOL/HDL RATIO: 4
Cholesterol: 166 mg/dL (ref 0–200)
HDL: 43.7 mg/dL (ref >39.00)
LDL Cholesterol: 88 mg/dL (ref 0–99)
NONHDL: 122.3
Triglycerides: 171 mg/dL — ABNORMAL HIGH (ref 0.0–149.0)
VLDL: 34.2 mg/dL (ref 0.0–40.0)

## 2014-07-22 LAB — HEPATIC FUNCTION PANEL
ALBUMIN: 4.1 g/dL (ref 3.5–5.2)
ALK PHOS: 69 U/L (ref 39–117)
ALT: 13 U/L (ref 0–35)
AST: 13 U/L (ref 0–37)
BILIRUBIN TOTAL: 0.5 mg/dL (ref 0.2–1.2)
Bilirubin, Direct: 0.2 mg/dL (ref 0.0–0.3)
Total Protein: 7.1 g/dL (ref 6.0–8.3)

## 2014-07-22 LAB — MICROALBUMIN / CREATININE URINE RATIO
Creatinine,U: 165.9 mg/dL
Microalb Creat Ratio: 2.5 mg/g (ref 0.0–30.0)
Microalb, Ur: 4.2 mg/dL — ABNORMAL HIGH (ref 0.0–1.9)

## 2014-07-22 LAB — TSH: TSH: 1.61 u[IU]/mL (ref 0.35–4.50)

## 2014-07-31 ENCOUNTER — Other Ambulatory Visit: Payer: Self-pay | Admitting: Internal Medicine

## 2014-08-12 ENCOUNTER — Other Ambulatory Visit: Payer: Self-pay | Admitting: Internal Medicine

## 2014-08-31 ENCOUNTER — Telehealth: Payer: Self-pay | Admitting: Internal Medicine

## 2014-08-31 MED ORDER — SITAGLIPTIN PHOS-METFORMIN HCL 50-1000 MG PO TABS: 1.0000 | ORAL_TABLET | Freq: Two times a day (BID) | ORAL | Status: DC

## 2014-08-31 NOTE — Telephone Encounter (Signed)
Pt called request to speak to the assistant concern about sitaGLIPtin-metformin (JANUMET) 50-1000 MG, pt stated [facility] need an approval from our office before she can get the whole refill (they only give her half). Please call pt

## 2014-08-31 NOTE — Telephone Encounter (Signed)
Left message on machine for pt to return my call if addition assistance is needed. New Rx sent to [facility] for Janumet

## 2014-09-02 ENCOUNTER — Other Ambulatory Visit: Payer: Self-pay | Admitting: Internal Medicine

## 2014-09-08 ENCOUNTER — Other Ambulatory Visit: Payer: Self-pay | Admitting: Internal Medicine

## 2014-10-07 LAB — HM MAMMOGRAPHY

## 2014-10-13 ENCOUNTER — Encounter: Payer: Self-pay | Admitting: Internal Medicine

## 2014-11-08 ENCOUNTER — Telehealth: Payer: Self-pay | Admitting: Internal Medicine

## 2014-11-08 NOTE — Telephone Encounter (Signed)
Patient is having swelling  In her feet and when she put her legs up they go down, what could she take to help the swelling go down? Please advise

## 2014-11-09 NOTE — Telephone Encounter (Signed)
Low salt diet, leg elevation when sitting, and compression stockings are usually the best tx for this type of swelling;  I can do rx for compression stockings but is usually not covered by insurance,  To also cont hct 25 qd

## 2014-11-10 NOTE — Telephone Encounter (Signed)
No answer, no VM

## 2014-11-11 NOTE — Telephone Encounter (Signed)
Left message on machine for pt to return my call  

## 2014-11-16 NOTE — Telephone Encounter (Signed)
Left message on machine for pt to return my call  

## 2014-11-16 NOTE — Telephone Encounter (Signed)
Left message on machine for pt to return my call, closing phone note until further contact from pt

## 2015-02-10 ENCOUNTER — Ambulatory Visit: Payer: 59 | Admitting: Internal Medicine

## 2015-03-17 ENCOUNTER — Ambulatory Visit: Payer: 59 | Admitting: Internal Medicine

## 2015-04-16 DIAGNOSIS — C801 Malignant (primary) neoplasm, unspecified: Secondary | ICD-10-CM

## 2015-04-16 HISTORY — DX: Malignant (primary) neoplasm, unspecified: C80.1

## 2015-04-18 ENCOUNTER — Other Ambulatory Visit: Payer: Self-pay | Admitting: Internal Medicine

## 2015-05-12 ENCOUNTER — Ambulatory Visit: Payer: 59 | Admitting: Internal Medicine

## 2015-06-09 ENCOUNTER — Ambulatory Visit: Payer: 59 | Admitting: Internal Medicine

## 2015-07-07 ENCOUNTER — Ambulatory Visit: Payer: 59 | Admitting: Internal Medicine

## 2015-07-09 ENCOUNTER — Other Ambulatory Visit: Payer: Self-pay | Admitting: Internal Medicine

## 2015-07-21 ENCOUNTER — Other Ambulatory Visit: Payer: Self-pay | Admitting: Internal Medicine

## 2015-07-24 ENCOUNTER — Other Ambulatory Visit: Payer: Self-pay | Admitting: *Deleted

## 2015-07-24 MED ORDER — HYDROCHLOROTHIAZIDE 25 MG PO TABS: 25.0000 mg | ORAL_TABLET | Freq: Every day | ORAL | Status: DC

## 2015-07-24 NOTE — Telephone Encounter (Signed)
Received call pt states she is needing refill on her HCTZ. Verified [facility].../lmb

## 2015-08-04 ENCOUNTER — Other Ambulatory Visit: Payer: Self-pay | Admitting: Internal Medicine

## 2015-08-11 ENCOUNTER — Ambulatory Visit: Payer: 59 | Admitting: Internal Medicine

## 2015-08-12 ENCOUNTER — Other Ambulatory Visit: Payer: Self-pay | Admitting: Internal Medicine

## 2015-08-19 ENCOUNTER — Other Ambulatory Visit: Payer: Self-pay | Admitting: Internal Medicine

## 2015-08-31 ENCOUNTER — Other Ambulatory Visit: Payer: Self-pay | Admitting: Internal Medicine

## 2015-09-14 DIAGNOSIS — Z9289 Personal history of other medical treatment: Secondary | ICD-10-CM

## 2015-09-14 HISTORY — DX: Personal history of other medical treatment: Z92.89

## 2015-09-14 HISTORY — PX: COLONOSCOPY: SHX174

## 2015-09-15 ENCOUNTER — Encounter: Payer: Self-pay | Admitting: Internal Medicine

## 2015-09-15 ENCOUNTER — Ambulatory Visit (INDEPENDENT_AMBULATORY_CARE_PROVIDER_SITE_OTHER): Payer: 59 | Admitting: Internal Medicine

## 2015-09-15 VITALS — BP 140/78 | HR 110 | Temp 98.3°F | Resp 20 | Wt 158.0 lb

## 2015-09-15 DIAGNOSIS — E119 Type 2 diabetes mellitus without complications: Secondary | ICD-10-CM | POA: Diagnosis not present

## 2015-09-15 DIAGNOSIS — E785 Hyperlipidemia, unspecified: Secondary | ICD-10-CM

## 2015-09-15 DIAGNOSIS — R6889 Other general symptoms and signs: Secondary | ICD-10-CM

## 2015-09-15 DIAGNOSIS — Z0001 Encounter for general adult medical examination with abnormal findings: Secondary | ICD-10-CM

## 2015-09-15 DIAGNOSIS — I10 Essential (primary) hypertension: Secondary | ICD-10-CM | POA: Diagnosis not present

## 2015-09-15 DIAGNOSIS — Z1159 Encounter for screening for other viral diseases: Secondary | ICD-10-CM

## 2015-09-15 NOTE — Assessment & Plan Note (Signed)

## 2015-09-15 NOTE — Progress Notes (Signed)
Subjective:    Patient ID: Karen Good, female    DOB: 09/19/1955, 60 y.o.   MRN: [id number]  HPI  Here for wellness and f/u;  Overall doing ok;  Pt denies Chest pain, worsening SOB, DOE, wheezing, orthopnea, PND, worsening LE edema, palpitations, dizziness or syncope.  Pt denies neurological change such as new headache, facial or extremity weakness.  Pt denies polydipsia, polyuria, or low sugar symptoms. Pt states overall good compliance with treatment and medications, good tolerability, and has been trying to follow appropriate diet.  Pt denies worsening depressive symptoms, suicidal ideation or panic. No fever, night sweats, wt loss, loss of appetite, or other constitutional symptoms.  Pt states good ability with ADL's, has low fall risk, home safety reviewed and adequate, no other significant changes in hearing or vision, and only occasionally active with exercise.  Here to f/u metabolic disorders; overall doing ok,  Pt denies chest pain, increasing sob or doe, wheezing, orthopnea, PND, [name], palpitations, dizziness or syncope.  Pt denies new neurological symptoms such as new headache, or facial or extremity weakness or numbness.  Pt denies polydipsia, polyuria, or low sugar episode.   Pt denies new neurological symptoms such as new headache, or facial or extremity weakness or numbness.   Pt states overall good compliance with meds, mostly trying to follow appropriate diet, with wt overall down with better diet,  but little exercise however. Wt Readings from Last 3 Encounters:  09/15/15 158 lb (71.668 kg)  07/15/14 162 lb 1.9 oz (73.537 kg)  01/29/13 170 lb 8 oz (77.338 kg)   Past Medical History  Diagnosis Date  . BACK PAIN 04/13/2010  . BACK STRAIN, LUMBAR 04/13/2010  . DIABETES MELLITUS, TYPE II 06/02/2008  . HYPERLIPIDEMIA 06/02/2008  . HYPERTENSION 06/02/2008   Past Surgical History  Procedure Laterality Date  . S/p breast biopsy  4/07    benign    reports that  she has never smoked. She does not have any smokeless tobacco history on file. She reports that she does not drink alcohol or use illicit drugs. family history includes Diabetes in her father, mother, and other; Heart disease in her father; Hypertension in her father. No Known Allergies Current Outpatient Prescriptions on File Prior to Visit  Medication Sig Dispense Refill  . amLODipine (NORVASC) 10 MG tablet Take 1 tablet (10 mg total) by mouth daily. 30 tablet 10  . amLODipine (NORVASC) 10 MG tablet TAKE 1 TABLET BY MOUTH EVERY DAY 30 tablet 5  . aspirin 81 MG tablet Take 81 mg by mouth daily.      Marland Kitchen atenolol (TENORMIN) 100 MG tablet TAKE 1 TABLET (100 MG TOTAL) BY MOUTH DAILY. * INSURANCE WILL ONLY COVER 30 DAY* 30 tablet 11  . atorvastatin (LIPITOR) 20 MG tablet Take 1 tablet (20 mg total) by mouth daily. 10 tablet 0  . atorvastatin (LIPITOR) 20 MG tablet Take 1 tablet (20 mg total) by mouth daily. 90 tablet 3  . atorvastatin (LIPITOR) 20 MG tablet TAKE 1 TABLET (20 MG TOTAL) BY MOUTH DAILY. 90 tablet 3  . glipiZIDE (GLUCOTROL XL) 10 MG 24 hr tablet Take 1 tablet (10 mg total) by mouth daily with breakfast. 90 tablet 3  . GLIPIZIDE XL 5 MG 24 hr tablet TAKE TWO TABLETS BY MOUTH DAILY 60 tablet 11  . GLIPIZIDE XL 5 MG 24 hr tablet TAKE TWO TABLETS BY MOUTH DAILY 60 tablet 11  . hydrochlorothiazide (HYDRODIURIL) 25 MG tablet TAKE 1 TABLET (25  MG TOTAL) BY MOUTH DAILY. YEARLY PHYSICAL W/LABS IS DUE MUST SEE MD FOR REFILLS 30 tablet 0  . HYDROcodone-acetaminophen (NORCO) 5-325 MG per tablet Take 1 tablet by mouth every 6 (six) hours as needed for moderate pain. 20 tablet 0  . irbesartan (AVAPRO) 300 MG tablet Take 1 tablet (300 mg total) by mouth at bedtime. 90 tablet 3  . irbesartan (AVAPRO) 300 MG tablet TAKE 1 TABLET (300 MG TOTAL) BY MOUTH AT BEDTIME. 90 tablet 2  . KLOR-CON M10 10 MEQ tablet TAKE 1 TABLET DAILY 90 tablet 0  . pioglitazone (ACTOS) 45 MG tablet TAKE 1 TABLET (45 MG TOTAL) BY  MOUTH DAILY. 90 tablet 3  . potassium chloride (KLOR-CON M10) 10 MEQ tablet Take 1 tablet (10 mEq total) by mouth once. Overdue for yearly physical w/labs must see MD for refills 30 tablet 0  . sitaGLIPtin-metformin (JANUMET) 50-1000 MG per tablet Take 1 tablet by mouth 2 (two) times daily. 180 tablet 3  . irbesartan (AVAPRO) 300 MG tablet Take 1 tablet (300 mg total) by mouth daily. 90 tablet 3  . potassium chloride (KLOR-CON 10) 10 MEQ CR tablet Take 1 tablet (10 mEq total) by mouth daily. 90 tablet 3  . [DISCONTINUED] nebivolol (BYSTOLIC) 10 MG tablet Take 1 tablet (10 mg total) by mouth daily. 90 tablet 3  . [DISCONTINUED] valsartan (DIOVAN) 320 MG tablet Take 1 tablet (320 mg total) by mouth daily. 90 tablet 3   No current facility-administered medications on file prior to visit.   Review of Systems Constitutional: Negative for increased diaphoresis, or other activity, appetite or siginficant weight change other than noted HENT: Negative for worsening hearing loss, ear pain, facial swelling, mouth sores and neck stiffness.   Eyes: Negative for other worsening pain, redness or visual disturbance.  Respiratory: Negative for choking or stridor Cardiovascular: Negative for other chest pain and palpitations.  Gastrointestinal: Negative for worsening diarrhea, blood in stool, or abdominal distention Genitourinary: Negative for hematuria, flank pain or change in urine volume.  Musculoskeletal: Negative for myalgias or other joint complaints.  Skin: Negative for other color change and wound or drainage.  Neurological: Negative for syncope and numbness. other than noted Hematological: Negative for adenopathy. or other swelling Psychiatric/Behavioral: Negative for hallucinations, SI, self-injury, decreased concentration or other worsening agitation.      Objective:   Physical Exam BP 140/78 mmHg  Pulse 110  Temp(Src) 98.3 F (36.8 C) (Oral)  Resp 20  Wt 158 lb (71.668 kg)  SpO2 98% VS  noted,  Constitutional: Pt is oriented to person, place, and time. Appears well-developed and well-nourished, in no significant distress Head: Normocephalic and atraumatic  Eyes: Conjunctivae and EOM are normal. Pupils are equal, round, and reactive to light Right Ear: External ear normal.  Left Ear: External ear normal Nose: Nose normal.  Mouth/Throat: Oropharynx is clear and moist  Neck: Normal range of motion. Neck supple. No JVD present. No tracheal deviation present or significant neck LA or mass Cardiovascular: Normal rate, regular rhythm, normal heart sounds and intact distal pulses.   Pulmonary/Chest: Effort normal and breath sounds without rales or wheezing  Abdominal: Soft. Bowel sounds are normal. NT. No HSM  Musculoskeletal: Normal range of motion. Exhibits no edema Lymphadenopathy: Has no cervical adenopathy.  Neurological: Pt is alert and oriented to person, place, and time. Pt has normal reflexes. No cranial nerve deficit. Motor grossly intact Skin: Skin is warm and dry. No rash noted or new ulcers Psychiatric:  Has normal mood  and affect. Behavior is normal.    Assessment & Plan:

## 2015-09-15 NOTE — Assessment & Plan Note (Signed)
stable overall by history and exam, recent data reviewed with pt, and pt to continue medical treatment as before,  to f/u any worsening symptoms or concerns Lab Results  Component Value Date   HGBA1C 10.0* 07/11/2014   In addition to the time spent performing CPE, I spent an additional 15 minutes face to face,in which greater than 50% of this time was spent in counseling and coordination of care for patient's illness as documented.

## 2015-09-15 NOTE — Assessment & Plan Note (Signed)
stable overall by history and exam, recent data reviewed with pt, and pt to continue medical treatment as before,  to f/u any worsening symptoms or concerns BP Readings from Last 3 Encounters:  09/15/15 140/78  07/15/14 146/88  02/09/14 180/87

## 2015-09-15 NOTE — Patient Instructions (Signed)

## 2015-09-15 NOTE — Progress Notes (Signed)
Pre visit review using our clinic review tool, if applicable. No additional management support is needed unless otherwise documented below in the visit note. 

## 2015-09-15 NOTE — Assessment & Plan Note (Signed)
stable overall by history and exam, recent data reviewed with pt, and pt to continue medical treatment as before,  to f/u any worsening symptoms or concerns Lab Results  Component Value Date   LDLCALC 88 07/22/2014

## 2015-09-17 ENCOUNTER — Other Ambulatory Visit: Payer: Self-pay | Admitting: Internal Medicine

## 2015-09-22 ENCOUNTER — Telehealth: Payer: Self-pay

## 2015-09-22 ENCOUNTER — Other Ambulatory Visit (INDEPENDENT_AMBULATORY_CARE_PROVIDER_SITE_OTHER): Payer: 59

## 2015-09-22 ENCOUNTER — Inpatient Hospital Stay (HOSPITAL_COMMUNITY)
Admission: EM | Admit: 2015-09-22 | Discharge: 2015-09-26 | DRG: 376 | Disposition: A | Discharge status: Home / Self Care | Payer: 59 | Attending: Internal Medicine | Admitting: Internal Medicine

## 2015-09-22 ENCOUNTER — Encounter (HOSPITAL_COMMUNITY): Payer: Self-pay

## 2015-09-22 ENCOUNTER — Emergency Department (HOSPITAL_COMMUNITY): Payer: 59

## 2015-09-22 DIAGNOSIS — Z7982 Long term (current) use of aspirin: Secondary | ICD-10-CM | POA: Diagnosis not present

## 2015-09-22 DIAGNOSIS — Z0001 Encounter for general adult medical examination with abnormal findings: Secondary | ICD-10-CM | POA: Diagnosis not present

## 2015-09-22 DIAGNOSIS — K449 Diaphragmatic hernia without obstruction or gangrene: Secondary | ICD-10-CM | POA: Diagnosis present

## 2015-09-22 DIAGNOSIS — E785 Hyperlipidemia, unspecified: Secondary | ICD-10-CM | POA: Diagnosis present

## 2015-09-22 DIAGNOSIS — D649 Anemia, unspecified: Secondary | ICD-10-CM

## 2015-09-22 DIAGNOSIS — Z7984 Long term (current) use of oral hypoglycemic drugs: Secondary | ICD-10-CM | POA: Diagnosis not present

## 2015-09-22 DIAGNOSIS — E119 Type 2 diabetes mellitus without complications: Secondary | ICD-10-CM

## 2015-09-22 DIAGNOSIS — N63 Unspecified lump in breast: Secondary | ICD-10-CM | POA: Diagnosis present

## 2015-09-22 DIAGNOSIS — K921 Melena: Secondary | ICD-10-CM | POA: Diagnosis not present

## 2015-09-22 DIAGNOSIS — I10 Essential (primary) hypertension: Secondary | ICD-10-CM | POA: Diagnosis present

## 2015-09-22 DIAGNOSIS — C19 Malignant neoplasm of rectosigmoid junction: Secondary | ICD-10-CM | POA: Diagnosis not present

## 2015-09-22 DIAGNOSIS — D500 Iron deficiency anemia secondary to blood loss (chronic): Secondary | ICD-10-CM | POA: Diagnosis present

## 2015-09-22 DIAGNOSIS — K2960 Other gastritis without bleeding: Secondary | ICD-10-CM | POA: Diagnosis present

## 2015-09-22 DIAGNOSIS — K922 Gastrointestinal hemorrhage, unspecified: Secondary | ICD-10-CM | POA: Diagnosis present

## 2015-09-22 DIAGNOSIS — K6289 Other specified diseases of anus and rectum: Secondary | ICD-10-CM

## 2015-09-22 DIAGNOSIS — K625 Hemorrhage of anus and rectum: Secondary | ICD-10-CM | POA: Diagnosis present

## 2015-09-22 DIAGNOSIS — K5900 Constipation, unspecified: Secondary | ICD-10-CM | POA: Diagnosis present

## 2015-09-22 DIAGNOSIS — R6889 Other general symptoms and signs: Secondary | ICD-10-CM

## 2015-09-22 DIAGNOSIS — Z1159 Encounter for screening for other viral diseases: Secondary | ICD-10-CM

## 2015-09-22 DIAGNOSIS — D508 Other iron deficiency anemias: Secondary | ICD-10-CM | POA: Diagnosis not present

## 2015-09-22 DIAGNOSIS — D509 Iron deficiency anemia, unspecified: Secondary | ICD-10-CM | POA: Diagnosis not present

## 2015-09-22 LAB — CBC WITH DIFFERENTIAL/PLATELET
BASOS PCT: 0.4 % (ref 0.0–3.0)
Basophils Absolute: 0 10*3/uL (ref 0.0–0.1)
EOS PCT: 1.6 % (ref 0.0–5.0)
Eosinophils Absolute: 0.1 10*3/uL (ref 0.0–0.7)
LYMPHS PCT: 23.2 % (ref 12.0–46.0)
Lymphs Abs: 1.2 10*3/uL (ref 0.7–4.0)
MCHC: 30.3 g/dL (ref 30.0–36.0)
MCV: 63.8 fl — ABNORMAL LOW (ref 78.0–100.0)
Monocytes Absolute: 0.4 10*3/uL (ref 0.1–1.0)
Monocytes Relative: 8.1 % (ref 3.0–12.0)
NEUTROS ABS: 3.4 10*3/uL (ref 1.4–7.7)
Neutrophils Relative %: 66.7 % (ref 43.0–77.0)
PLATELETS: 479 10*3/uL — AB (ref 150.0–400.0)
RBC: 3.58 Mil/uL — ABNORMAL LOW (ref 3.87–5.11)
RDW: 23.3 % — AB (ref 11.5–15.5)
WBC: 5.1 10*3/uL (ref 4.0–10.5)

## 2015-09-22 LAB — BASIC METABOLIC PANEL
BUN: 16 mg/dL (ref 6–23)
CHLORIDE: 103 meq/L (ref 96–112)
CO2: 29 mEq/L (ref 19–32)
CREATININE: 0.57 mg/dL (ref 0.40–1.20)
Calcium: 9.6 mg/dL (ref 8.4–10.5)
GFR: 114.24 mL/min (ref >60.00)
Glucose, Bld: 132 mg/dL — ABNORMAL HIGH (ref 70–99)
POTASSIUM: 3.5 meq/L (ref 3.5–5.1)
Sodium: 140 mEq/L (ref 135–145)

## 2015-09-22 LAB — URINALYSIS, ROUTINE W REFLEX MICROSCOPIC
BILIRUBIN URINE: NEGATIVE
HGB URINE DIPSTICK: NEGATIVE
KETONES UR: NEGATIVE
LEUKOCYTES UA: NEGATIVE
NITRITE: NEGATIVE
PH: 6 (ref 5.0–8.0)
Specific Gravity, Urine: 1.02 (ref 1.000–1.030)
TOTAL PROTEIN, URINE-UPE24: NEGATIVE
Urine Glucose: NEGATIVE
Urobilinogen, UA: 0.2 (ref 0.0–1.0)

## 2015-09-22 LAB — IRON AND TIBC
IRON: 11 ug/dL — AB (ref 28–170)
SATURATION RATIOS: 2 % — AB (ref 10.4–31.8)
TIBC: 671 ug/dL — AB (ref 250–450)
UIBC: 660 ug/dL

## 2015-09-22 LAB — CBC
HCT: 25.6 % — ABNORMAL LOW (ref 36.0–46.0)
Hemoglobin: 7.2 g/dL — ABNORMAL LOW (ref 12.0–15.0)
MCH: 18.3 pg — AB (ref 26.0–34.0)
MCHC: 28.1 g/dL — AB (ref 30.0–36.0)
MCV: 65 fL — AB (ref 78.0–100.0)
PLATELETS: 546 10*3/uL — AB (ref 150–400)
RBC: 3.94 MIL/uL (ref 3.87–5.11)
RDW: 21.2 % — AB (ref 11.5–15.5)
WBC: 8.4 10*3/uL (ref 4.0–10.5)

## 2015-09-22 LAB — COMPREHENSIVE METABOLIC PANEL
ALT: 16 U/L (ref 14–54)
AST: 17 U/L (ref 15–41)
Albumin: 3.9 g/dL (ref 3.5–5.0)
Alkaline Phosphatase: 70 U/L (ref 38–126)
Anion gap: 14 (ref 5–15)
BUN: 15 mg/dL (ref 6–20)
CHLORIDE: 103 mmol/L (ref 101–111)
CO2: 22 mmol/L (ref 22–32)
Calcium: 9.8 mg/dL (ref 8.9–10.3)
Creatinine, Ser: 0.68 mg/dL (ref 0.44–1.00)
GFR calc Af Amer: >60 mL/min (ref >60)
GFR calc non Af Amer: >60 mL/min (ref >60)
Glucose, Bld: 168 mg/dL — ABNORMAL HIGH (ref 65–99)
Potassium: 3 mmol/L — ABNORMAL LOW (ref 3.5–5.1)
Sodium: 139 mmol/L (ref 135–145)
Total Bilirubin: 0.5 mg/dL (ref 0.3–1.2)
Total Protein: 6.9 g/dL (ref 6.5–8.1)

## 2015-09-22 LAB — HEMOGLOBIN A1C: Hgb A1c MFr Bld: 5.9 % (ref 4.6–6.5)

## 2015-09-22 LAB — ABO/RH: ABO/RH(D): A POS

## 2015-09-22 LAB — MICROALBUMIN / CREATININE URINE RATIO
CREATININE, U: 118 mg/dL
MICROALB/CREAT RATIO: 7 mg/g (ref 0.0–30.0)
Microalb, Ur: 8.3 mg/dL — ABNORMAL HIGH (ref 0.0–1.9)

## 2015-09-22 LAB — VITAMIN B12: Vitamin B-12: 252 pg/mL (ref 180–914)

## 2015-09-22 LAB — LIPID PANEL
CHOL/HDL RATIO: 3
Cholesterol: 157 mg/dL (ref 0–200)
HDL: 48.6 mg/dL (ref >39.00)
LDL CALC: 93 mg/dL (ref 0–99)
NONHDL: 108.3
TRIGLYCERIDES: 78 mg/dL (ref 0.0–149.0)
VLDL: 15.6 mg/dL (ref 0.0–40.0)

## 2015-09-22 LAB — FERRITIN: Ferritin: 5 ng/mL — ABNORMAL LOW (ref 11–307)

## 2015-09-22 LAB — HEPATIC FUNCTION PANEL
ALT: 10 U/L (ref 0–35)
AST: 12 U/L (ref 0–37)
Albumin: 4 g/dL (ref 3.5–5.2)
Alkaline Phosphatase: 65 U/L (ref 39–117)
BILIRUBIN DIRECT: 0.1 mg/dL (ref 0.0–0.3)
BILIRUBIN TOTAL: 0.5 mg/dL (ref 0.2–1.2)
Total Protein: 7 g/dL (ref 6.0–8.3)

## 2015-09-22 LAB — MAGNESIUM: Magnesium: 1.6 mg/dL — ABNORMAL LOW (ref 1.7–2.4)

## 2015-09-22 LAB — RETICULOCYTES
RBC.: 3.98 MIL/uL (ref 3.87–5.11)
Retic Count, Absolute: 51.7 10*3/uL (ref 19.0–186.0)
Retic Ct Pct: 1.3 % (ref 0.4–3.1)

## 2015-09-22 LAB — POC OCCULT BLOOD, ED: Fecal Occult Bld: POSITIVE — AB

## 2015-09-22 LAB — FOLATE: FOLATE: 19.3 ng/mL (ref >5.9)

## 2015-09-22 LAB — TSH: TSH: 1.92 u[IU]/mL (ref 0.35–4.50)

## 2015-09-22 MED ORDER — IOPAMIDOL (ISOVUE-300) INJECTION 61%
INTRAVENOUS | Status: AC
  Administered 2015-09-22: 100 mL
  Filled 2015-09-22: qty 100

## 2015-09-22 MED ORDER — POTASSIUM CHLORIDE CRYS ER 20 MEQ PO TBCR
40.0000 meq | EXTENDED_RELEASE_TABLET | Freq: Once | ORAL | Status: AC
  Administered 2015-09-22: 40 meq via ORAL
  Filled 2015-09-22: qty 2

## 2015-09-22 NOTE — ED Notes (Signed)
Pt. Reports that Karen Good called her today and told her that her Hemoglobin is low. 6.  Pt. Reports  Feeling tired. Pt . Reports that she has bleeding from her rectum.  She stated, "It feels like hemrrhoids

## 2015-09-22 NOTE — Telephone Encounter (Signed)
Advise pt to go to the ER or urgent care but pt still want to speak to the assistant. Please call her

## 2015-09-22 NOTE — Telephone Encounter (Signed)
Karen Good please call her

## 2015-09-22 NOTE — ED Provider Notes (Signed)
CSN: [id number]     Arrival date & time 09/22/15  1618 History   First MD Initiated Contact with Patient 09/22/15 1748     Chief Complaint  Patient presents with  . Rectal Bleeding  . Abnormal Lab     (Consider location/radiation/quality/duration/timing/severity/associated sxs/prior Treatment) HPI  Blood pressure 148/74, pulse 108, temperature 98.3 F (36.8 C), temperature source Oral, resp. rate 17, height 5\' 5"  (1.651 m), weight 71.668 kg, SpO2 100 %.  Karen Good is a 60 y.o. female sent to the ED by primary care for evaluation of anemia, she went for her regular checkup and was found to be anemic with a hemoglobin of 6.9. Patient denies chest pain, palpitations, syncope, lightheadedness when standing, dyspnea on exertion. States she has a history of anemia but has never required transfusions, she used to take iron supplements but does not take them now. She is postmenopausal. She is postmenopausal and her anemia was thought to be secondary to menorrhagia secondary to fibroids. She's noticed bright red blood per rectum with normally formed stool over the course of one month. She denies melena, rectal pain with bowel movements, diarrhea, history of diverticulosis she has never had a colonoscopy. Patient has ibuprofen which she takes for muscular skeletal pain, she takes this daily 3x 200 mg tabs in the a.m. and 3 in the PM. This is the only NSAID that she takes, she does not drink alcohol. No easy bruising/bleeding/night sweats/unintentional weight loss.  Past Medical History  Diagnosis Date  . BACK PAIN 04/13/2010  . BACK STRAIN, LUMBAR 04/13/2010  . DIABETES MELLITUS, TYPE II 06/02/2008  . HYPERLIPIDEMIA 06/02/2008  . HYPERTENSION 06/02/2008   Past Surgical History  Procedure Laterality Date  . S/p breast biopsy  4/07    benign   Family History  Problem Relation Age of Onset  . Diabetes Mother   . Heart disease Father   . Diabetes Father   . Hypertension Father   . Diabetes  Other    Social History  Substance Use Topics  . Smoking status: Never Smoker   . Smokeless tobacco: None  . Alcohol Use: No   OB History    No data available     Review of Systems  10 systems reviewed and found to be negative, except as noted in the HPI.   Allergies  Review of patient's allergies indicates no known allergies.  Home Medications   Prior to Admission medications   Medication Sig Start Date End Date Taking? Authorizing Provider  amLODipine (NORVASC) 10 MG tablet Take 1 tablet (10 mg total) by mouth daily. 07/15/14   Biagio Borg, MD  amLODipine (NORVASC) 10 MG tablet TAKE 1 TABLET BY MOUTH EVERY DAY 04/18/15   Biagio Borg, MD  aspirin 81 MG tablet Take 81 mg by mouth daily.      Historical Provider, MD  atenolol (TENORMIN) 100 MG tablet TAKE 1 TABLET (100 MG TOTAL) BY MOUTH DAILY. 09/18/15   Biagio Borg, MD  atorvastatin (LIPITOR) 20 MG tablet Take 1 tablet (20 mg total) by mouth daily. 07/19/14   Biagio Borg, MD  atorvastatin (LIPITOR) 20 MG tablet Take 1 tablet (20 mg total) by mouth daily. 07/19/14   Biagio Borg, MD  atorvastatin (LIPITOR) 20 MG tablet TAKE 1 TABLET (20 MG TOTAL) BY MOUTH DAILY. 08/04/15   Biagio Borg, MD  glipiZIDE (GLUCOTROL XL) 10 MG 24 hr tablet Take 1 tablet (10 mg total) by mouth daily with breakfast. 07/15/14  Biagio Borg, MD  GLIPIZIDE XL 5 MG 24 hr tablet TAKE TWO TABLETS BY MOUTH DAILY 08/01/14   Biagio Borg, MD  GLIPIZIDE XL 5 MG 24 hr tablet TAKE TWO TABLETS BY MOUTH DAILY 09/02/14   Biagio Borg, MD  hydrochlorothiazide (HYDRODIURIL) 25 MG tablet TAKE 1 TABLET (25 MG TOTAL) BY MOUTH DAILY. YEARLY PHYSICAL W/LABS IS DUE MUST SEE MD FOR REFILLS 09/18/15   Biagio Borg, MD  HYDROcodone-acetaminophen [facility]) 5-325 MG per tablet Take 1 tablet by mouth every 6 (six) hours as needed for moderate pain. 02/09/14   Melony Overly, MD  irbesartan (AVAPRO) 300 MG tablet Take 1 tablet (300 mg total) by mouth daily. 11/22/11 11/21/12  Biagio Borg, MD   irbesartan (AVAPRO) 300 MG tablet Take 1 tablet (300 mg total) by mouth at bedtime. 07/19/14   Biagio Borg, MD  irbesartan (AVAPRO) 300 MG tablet TAKE 1 TABLET (300 MG TOTAL) BY MOUTH AT BEDTIME. 08/14/15   Biagio Borg, MD  KLOR-CON M10 10 MEQ tablet TAKE 1 TABLET DAILY 01/13/14   Biagio Borg, MD  pioglitazone (ACTOS) 45 MG tablet TAKE 1 TABLET (45 MG TOTAL) BY MOUTH DAILY. 07/10/15   Biagio Borg, MD  potassium chloride (KLOR-CON 10) 10 MEQ CR tablet Take 1 tablet (10 mEq total) by mouth daily. 07/09/10 07/09/11  Biagio Borg, MD  potassium chloride (KLOR-CON M10) 10 MEQ tablet Take 1 tablet (10 mEq total) by mouth once. Overdue for yearly physical w/labs must see MD for refills 08/31/15   Biagio Borg, MD  sitaGLIPtin-metformin (JANUMET) 50-1000 MG per tablet Take 1 tablet by mouth 2 (two) times daily. 08/31/14   Biagio Borg, MD   BP 138/85 mmHg  Pulse 105  Temp(Src) 98.3 F (36.8 C) (Oral)  Resp 22  Ht 5\' 5"  (1.651 m)  Wt 71.668 kg  BMI 26.29 kg/m2  SpO2 100% Physical Exam  Constitutional: She is oriented to person, place, and time. She appears well-developed and well-nourished. No distress.  HENT:  Head: Normocephalic and atraumatic.  Mouth/Throat: Oropharynx is clear and moist.  Eyes: Conjunctivae and EOM are normal. Pupils are equal, round, and reactive to light.  Mild conjunctival pallor  Neck: Normal range of motion.  Cardiovascular: Regular rhythm and intact distal pulses.   Mild tachycardia  Pulmonary/Chest: Effort normal and breath sounds normal.  Abdominal: Soft. There is no tenderness.  Genitourinary: Guaiac positive stool.  Rectal rectal exam a chaperoned by nurse Erlene Quan, no rashes or lesions, no fissures, no internal or external hemorrhoids appreciated, normal rectal tone, no stool in the rectal vault, moderate amount of red blood.   Musculoskeletal: Normal range of motion.  Neurological: She is alert and oriented to person, place, and time.  Skin: She is not diaphoretic.   Psychiatric:  Patient tearful, states that she is nervous  Nursing note and vitals reviewed.   ED Course  Procedures (including critical care time) Labs Review Labs Reviewed  COMPREHENSIVE METABOLIC PANEL - Abnormal; Notable for the following:    Potassium 3.0 (*)    Glucose, Bld 168 (*)    All other components within normal limits  CBC - Abnormal; Notable for the following:    Hemoglobin 7.2 (*)    HCT 25.6 (*)    MCV 65.0 (*)    MCH 18.3 (*)    MCHC 28.1 (*)    RDW 21.2 (*)    Platelets 546 (*)    All other components  within normal limits  POC OCCULT BLOOD, ED - Abnormal; Notable for the following:    Fecal Occult Bld POSITIVE (*)    All other components within normal limits  RETICULOCYTES  VITAMIN B12  FOLATE  IRON AND TIBC  FERRITIN  MAGNESIUM  TYPE AND SCREEN  ABO/RH    Imaging Review No results found. I have personally reviewed and evaluated these images and lab results as part of my medical decision-making.   EKG Interpretation None      MDM   Final diagnoses:  Anemia, unspecified anemia type  Lower GI bleed    Filed Vitals:   09/22/15 1845 09/22/15 1915 09/22/15 1945 09/22/15 2000  BP: 148/74 141/66 151/61 138/85  Pulse: 108 104 104 105  Temp:      TempSrc:      Resp: 17 10 14 22   Height:      Weight:      SpO2: 100% 100% 100% 100%    Karen Good is 60 y.o. female presenting with Anemia from primary care, her hemoglobin is 7.2, crit is 25 with platelet count of 540. Low MCV and MCH. Patient has been reporting bright red blood per rectum over the course of last month. States she only notices it when she has bowel movement she denies melena. Patient is tachycardic but her blood pressure strong, she does appear mildly agitated, I have offered her anti-anxiolysis medications but she declined. I think her tachycardia is likely secondary to nervousness. Low potassium at 3.0, will replete orally and check a magnesium level.  No signs of a  symptomatic anemia, don't think she needs a transfusion at this point. She's never had a colonoscopy, will obtain CT abdomen pelvis to evaluate for diverticulosis and also ordered anemia panel.  Will need admission for serial CBCs, case signed out to PA Ward pending CT abdomen pelvis and magnesium.    Karen Ricks Shelba Susi, PA-C 09/22/15 [id number]  Fredia Sorrow, MD 09/22/15 2225

## 2015-09-22 NOTE — ED Provider Notes (Signed)
Care assumed from previous provider PA Pisciotta, case discussed, plan agreed upon. Will follow up on pending labwork and CT abdomen with likely admission for lower GI bleed.   CT abdomen shows soft tissue fullness at the rectosigmoid junction, polyp or carcinoma cannot be ruled out.   Consulted hospitalist, Dr. Dreama Saa who will admit for further evaluation.   [facility] Ward, PA-C 09/22/15 2239  Fredia Sorrow, MD 09/29/15 1024

## 2015-09-22 NOTE — Telephone Encounter (Signed)
Received call from Urgent Care Scheduler. Informed per Dr Jenny Reichmann Pt should go to ED due to the chance of having to have a blood transfusion. Cindy with Urgent care informed pt.

## 2015-09-22 NOTE — ED Notes (Signed)
Pt up to restroom.

## 2015-09-22 NOTE — Telephone Encounter (Signed)
Lab called and stated that pt had a critical hemoglobin of 6.9. Advised per Dr. Quay Burow that pt go to either and urgent care or ER to have stool check due to possible GI bleed. Called pt to let her know. She will go to the hospital. I asked that if pt have any questions for her to call Dr. Quay Burow assistant Geni Bers.

## 2015-09-23 ENCOUNTER — Other Ambulatory Visit: Payer: Self-pay | Admitting: Internal Medicine

## 2015-09-23 DIAGNOSIS — K921 Melena: Secondary | ICD-10-CM | POA: Diagnosis present

## 2015-09-23 LAB — GLUCOSE, CAPILLARY
GLUCOSE-CAPILLARY: 129 mg/dL — AB (ref 65–99)
GLUCOSE-CAPILLARY: 161 mg/dL — AB (ref 65–99)
Glucose-Capillary: 128 mg/dL — ABNORMAL HIGH (ref 65–99)
Glucose-Capillary: 125 mg/dL — ABNORMAL HIGH (ref 65–99)

## 2015-09-23 LAB — HEPATITIS C ANTIBODY: HCV AB: NEGATIVE

## 2015-09-23 LAB — PREPARE RBC (CROSSMATCH)

## 2015-09-23 LAB — HEMOGLOBIN AND HEMATOCRIT, BLOOD
HCT: 27.4 % — ABNORMAL LOW (ref 36.0–46.0)
HEMOGLOBIN: 8.1 g/dL — AB (ref 12.0–15.0)

## 2015-09-23 MED ORDER — PEG 3350-KCL-NA BICARB-NACL 420 G PO SOLR
4000.0000 mL | Freq: Once | ORAL | Status: AC
  Administered 2015-09-23: 4000 mL via ORAL
  Filled 2015-09-23: qty 4000

## 2015-09-23 MED ORDER — POTASSIUM CHLORIDE CRYS ER 10 MEQ PO TBCR
10.0000 meq | EXTENDED_RELEASE_TABLET | Freq: Once | ORAL | Status: AC
  Administered 2015-09-23: 10 meq via ORAL
  Filled 2015-09-23: qty 1

## 2015-09-23 MED ORDER — SODIUM CHLORIDE 0.9 % IV SOLN
INTRAVENOUS | Status: DC
  Administered 2015-09-23 – 2015-09-26 (×4): via INTRAVENOUS
  Filled 2015-09-23 (×12): qty 1000

## 2015-09-23 MED ORDER — INSULIN ASPART 100 UNIT/ML ~~LOC~~ SOLN
0.0000 [IU] | Freq: Three times a day (TID) | SUBCUTANEOUS | Status: DC
  Administered 2015-09-23 (×2): 1 [IU] via SUBCUTANEOUS
  Administered 2015-09-23: 3 [IU] via SUBCUTANEOUS
  Administered 2015-09-24: 2 [IU] via SUBCUTANEOUS
  Administered 2015-09-24 (×2): 1 [IU] via SUBCUTANEOUS
  Administered 2015-09-25 – 2015-09-26 (×5): 2 [IU] via SUBCUTANEOUS

## 2015-09-23 MED ORDER — HYDROCODONE-ACETAMINOPHEN 5-325 MG PO TABS
1.0000 | ORAL_TABLET | Freq: Four times a day (QID) | ORAL | Status: DC | PRN
  Administered 2015-09-24 – 2015-09-26 (×4): 1 via ORAL
  Filled 2015-09-23 (×4): qty 1

## 2015-09-23 MED ORDER — SODIUM CHLORIDE 0.9 % IV SOLN
Freq: Once | INTRAVENOUS | Status: AC
  Administered 2015-09-23: 02:00:00 via INTRAVENOUS

## 2015-09-23 MED ORDER — IRBESARTAN 300 MG PO TABS
300.0000 mg | ORAL_TABLET | Freq: Every day | ORAL | Status: DC
  Administered 2015-09-23 – 2015-09-26 (×4): 300 mg via ORAL
  Filled 2015-09-23 (×4): qty 1

## 2015-09-23 MED ORDER — ATORVASTATIN CALCIUM 20 MG PO TABS
20.0000 mg | ORAL_TABLET | Freq: Every day | ORAL | Status: DC
  Administered 2015-09-23 – 2015-09-26 (×4): 20 mg via ORAL
  Filled 2015-09-23 (×4): qty 1

## 2015-09-23 MED ORDER — MAGNESIUM SULFATE 2 GM/50ML IV SOLN
2.0000 g | Freq: Once | INTRAVENOUS | Status: AC
  Administered 2015-09-23: 2 g via INTRAVENOUS
  Filled 2015-09-23: qty 50

## 2015-09-23 MED ORDER — FAMOTIDINE IN NACL 20-0.9 MG/50ML-% IV SOLN
20.0000 mg | Freq: Two times a day (BID) | INTRAVENOUS | Status: DC
Start: 2015-09-23 — End: 2015-09-26
  Administered 2015-09-23 – 2015-09-26 (×8): 20 mg via INTRAVENOUS
  Filled 2015-09-23 (×9): qty 50

## 2015-09-23 MED ORDER — ENOXAPARIN SODIUM 40 MG/0.4ML ~~LOC~~ SOLN: 40.0000 mg | SUBCUTANEOUS | Status: DC

## 2015-09-23 MED ORDER — FERROUS SULFATE 325 (65 FE) MG PO TABS
325.0000 mg | ORAL_TABLET | Freq: Three times a day (TID) | ORAL | Status: DC
  Filled 2015-09-23: qty 1

## 2015-09-23 MED ORDER — DEXTROSE-NACL 5-0.45 % IV SOLN
INTRAVENOUS | Status: DC
  Administered 2015-09-23: 01:00:00 via INTRAVENOUS

## 2015-09-23 MED ORDER — AMLODIPINE BESYLATE 10 MG PO TABS
10.0000 mg | ORAL_TABLET | Freq: Every day | ORAL | Status: DC
  Administered 2015-09-23 – 2015-09-26 (×4): 10 mg via ORAL
  Filled 2015-09-23 (×4): qty 1

## 2015-09-23 NOTE — Progress Notes (Addendum)
Patient seen and examined  60 yo female with history of HTN,DMII who came with cc of hematochezia for almost a month with recurrent red blood per rectum without pain. No melena. No hematemesis or hematuria or hemoptysis.she went for her regular checkup and was found to be anemic with a hemoglobin of 6.9.She's noticed bright red blood per rectum with normally formed stool over the course of one month.she has never had a colonoscopy. Patient has ibuprofen which she takes for muscular skeletal pain, she takes this daily 3x 200 mg tabs in the a.m. and 3 in the PM. This is the only NSAID that she takes, she does not drink alcohol. No easy bruising/bleeding/night sweats/unintentional weight loss. CT abdomen pelvis shows fullness in the rectosigmoid junction concerning for polyp versus carcinoma. Dr. Collene Mares from gastroenterology has been consulted. Patient is status post receiving 1 unit of packed red blood cells  Plan Keep patient on clear liquid diet, possible endoscopic evaluation tomorrow Replete electrolytes Follow CBC

## 2015-09-23 NOTE — H&P (Signed)
History and Physical  Karen Good Darwin [id number] DOB: 1956/04/04 DOA: 09/22/2015  PCP:  Cathlean Cower, MD   Chief Complaint:  Bloody stool   History of Present Illness:  Patient is a 60 yo female with history of HTN,DMII who came with cc of hematochezia for almost a month with recurrent red blood per rectum without pain. No melena. No hematemesis or hematuria or hemoptysis. No chest pain or dysuria or dizziness. No N/V/D/C/abd pain or dysuria. No vaginal bleeding.   Review of Systems:  CONSTITUTIONAL:     No night sweats.  No fatigue.  No fever. No chills. Eyes:                            No visual changes.  No eye pain.  No eye discharge.   ENT:                              No epistaxis.  No sinus pain.  No sore throat.   No congestion. RESPIRATORY:           No cough.  No wheeze.  No hemoptysis.  No dyspnea CARDIOVASCULAR   :  No chest pains.  No palpitations. GASTROINTESTINAL:  No abdominal pain.  No nausea. No vomiting.  No diarrhea. No constipation.  No hematemesis.  +hematochezia.  No melena. GENITOURINARY:      No urgency.  No frequency.  No dysuria.  No hematuria.  No obstructive symptoms.  No discharge.  No pain.   MUSCULOSKELETAL:  No musculoskeletal pain.  No joint swelling.  No arthritis. NEUROLOGICAL:        No confusion.  No weakness. No headache. No seizure. PSYCHIATRIC:             No depression. No anxiety. No suicidal ideation. SKIN:                             No rashes.  No lesions.  No wounds. ENDOCRINE:                No weight loss.  No polydipsia.  No polyuria.  No polyphagia. HEMATOLOGIC:           No purpura.  No petechiae.  No bleeding.  ALLERGIC                 : No pruritus.  No angioedema Other:  Past Medical and Surgical History:   Past Medical History  Diagnosis Date  . BACK PAIN 04/13/2010  . BACK STRAIN, LUMBAR 04/13/2010  . DIABETES MELLITUS, TYPE II 06/02/2008  . HYPERLIPIDEMIA 06/02/2008  . HYPERTENSION 06/02/2008   Past Surgical  History  Procedure Laterality Date  . S/p breast biopsy  4/07    benign    Social History:   reports that she has never smoked. She does not have any smokeless tobacco history on file. She reports that she does not drink alcohol or use illicit drugs.    No Known Allergies  Family History  Problem Relation Age of Onset  . Diabetes Mother   . Heart disease Father   . Diabetes Father   . Hypertension Father   . Diabetes Other       Prior to Admission medications   Medication Sig Start Date End Date Taking? Authorizing Provider  amLODipine (NORVASC) 10 MG tablet Take 1 tablet (10  mg total) by mouth daily. 07/15/14  Yes Biagio Borg, MD  aspirin 81 MG tablet Take 81 mg by mouth every Monday, Wednesday, and Friday.    Yes Historical Provider, MD  atenolol (TENORMIN) 100 MG tablet TAKE 1 TABLET (100 MG TOTAL) BY MOUTH DAILY. 09/18/15  Yes Biagio Borg, MD  atorvastatin (LIPITOR) 20 MG tablet Take 1 tablet (20 mg total) by mouth daily. 07/19/14  Yes Biagio Borg, MD  GLIPIZIDE XL 5 MG 24 hr tablet TAKE TWO TABLETS BY MOUTH DAILY 08/01/14  Yes Biagio Borg, MD  hydrochlorothiazide (HYDRODIURIL) 25 MG tablet TAKE 1 TABLET (25 MG TOTAL) BY MOUTH DAILY. YEARLY PHYSICAL W/LABS IS DUE MUST SEE MD FOR REFILLS Patient taking differently: Take 25 mg by mouth in the morning 09/18/15  Yes Biagio Borg, MD  ibuprofen (ADVIL,MOTRIN) 200 MG tablet Take 600 mg by mouth every 6 (six) hours as needed for headache, mild pain or moderate pain.   Yes Historical Provider, MD  irbesartan (AVAPRO) 300 MG tablet Take 1 tablet (300 mg total) by mouth at bedtime. 07/19/14  Yes Biagio Borg, MD  KLOR-CON M10 10 MEQ tablet TAKE 1 TABLET DAILY Patient taking differently: Take 1 tablet by mouth daily 01/13/14  Yes Biagio Borg, MD  pioglitazone (ACTOS) 45 MG tablet TAKE 1 TABLET (45 MG TOTAL) BY MOUTH DAILY. 07/10/15  Yes Biagio Borg, MD  sitaGLIPtin-metformin (JANUMET) 50-1000 MG per tablet Take 1 tablet by mouth 2 (two) times  daily. 08/31/14  Yes Biagio Borg, MD  amLODipine (NORVASC) 10 MG tablet TAKE 1 TABLET BY MOUTH EVERY DAY 04/18/15   Biagio Borg, MD  atorvastatin (LIPITOR) 20 MG tablet Take 1 tablet (20 mg total) by mouth daily. 07/19/14   Biagio Borg, MD  atorvastatin (LIPITOR) 20 MG tablet TAKE 1 TABLET (20 MG TOTAL) BY MOUTH DAILY. 08/04/15   Biagio Borg, MD  glipiZIDE (GLUCOTROL XL) 10 MG 24 hr tablet Take 1 tablet (10 mg total) by mouth daily with breakfast. 07/15/14   Biagio Borg, MD  GLIPIZIDE XL 5 MG 24 hr tablet TAKE TWO TABLETS BY MOUTH DAILY 09/02/14   Biagio Borg, MD  HYDROcodone-acetaminophen (NORCO) 5-325 MG per tablet Take 1 tablet by mouth every 6 (six) hours as needed for moderate pain. 02/09/14   Melony Overly, MD  irbesartan (AVAPRO) 300 MG tablet Take 1 tablet (300 mg total) by mouth daily. 11/22/11 09/22/15  Biagio Borg, MD  irbesartan (AVAPRO) 300 MG tablet TAKE 1 TABLET (300 MG TOTAL) BY MOUTH AT BEDTIME. 08/14/15   Biagio Borg, MD  potassium chloride (KLOR-CON 10) 10 MEQ CR tablet Take 1 tablet (10 mEq total) by mouth daily. 07/09/10 09/22/15  Biagio Borg, MD  potassium chloride (KLOR-CON M10) 10 MEQ tablet Take 1 tablet (10 mEq total) by mouth once. Overdue for yearly physical w/labs must see MD for refills 08/31/15   Biagio Borg, MD    Physical Exam: BP 123/69 mmHg  Pulse 96  Temp(Src) 98.4 F (36.9 C) (Oral)  Resp 18  Ht 5\' 5"  (1.651 m)  Wt 71.668 kg (158 lb)  BMI 26.29 kg/m2  SpO2 100%  GENERAL :   Alert and cooperative, and appears to be in no acute distress. HEAD:           normocephalic. EYES:            PERRL, EOMI.  vision is grossly intact. EARS:  hearing grossly intact. NOSE:           No nasal discharge. THROAT:     Oral cavity and pharynx normal.   NECK:          supple, non-tender. CARDIAC:    Normal S1 and S2. No gallop. No murmurs.  Vascular:     no peripheral edema. Extremities are warm and well perfused. No carotid bruits. LUNGS:       Clear to auscultation    ABDOMEN: Positive bowel sounds. Soft, nondistended, nontender. No guarding or rebound.  Rectal exam done by ER physician: no ext hemorrhoids.  MSK:           No joint erythema or tenderness. Normal muscular development. EXT           : No significant deformity or joint abnormality. Neuro        : Alert, oriented to person, place, and time.                      CN II-XII intact.  SKIN:            No rash. No lesions. PSYCH:       No hallucination. Patient is not suicidal.          Labs on Admission:  Reviewed.   Radiological Exams on Admission: Ct Abdomen Pelvis W Contrast  09/22/2015  CLINICAL DATA:  Hematochezia.  Hypertension and diabetes. EXAM: CT ABDOMEN AND PELVIS WITH CONTRAST TECHNIQUE: Multidetector CT imaging of the abdomen and pelvis was performed using the standard protocol following bolus administration of intravenous contrast. CONTRAST:  1 ISOVUE-300 IOPAMIDOL (ISOVUE-300) INJECTION 61% COMPARISON:  None. FINDINGS: Lower chest: Clear lung bases. Normal heart size without pericardial or pleural effusion. Hepatobiliary: Variant lateral segment left liver lobe extending in the left upper quadrant. Normal gallbladder, without biliary ductal dilatation. Pancreas: Normal, without mass or ductal dilatation. Spleen: Normal in size, without focal abnormality. Adrenals/Urinary Tract: Normal adrenal glands. Normal kidneys, without hydronephrosis. Normal urinary bladder. Stomach/Bowel: Proximal gastric underdistention. Remainder of the stomach is unremarkable. Equivocal soft tissue fullness at the rectosigmoid junction, including on images 15- 63/series 201 and coronal image 69. The remainder of the colon and terminal ileum are unremarkable. Appendix is likely identified and normal on image 50/ series 201. Normal small bowel. Vascular/Lymphatic: Aortic and branch vessel atherosclerosis. No abdominopelvic adenopathy. Reproductive: Uterine calcifications likely relate to underlying fibroids. Fundal  lesion posteriorly at approximately 3.4 cm. No adnexal mass. Other: No significant free fluid. No evidence of omental or peritoneal disease. Mild abdominal wall laxity without well-defined hernia. Musculoskeletal: No acute osseous abnormality. Mild disc bulges at L3-4 and L4-5. IMPRESSION: 1. Soft tissue fullness at the rectosigmoid junction. Although this could be related to underdistention and stool, polyp or carcinoma cannot be excluded in this patient with hematochezia. 2. Otherwise, no acute process in the abdomen or pelvis. 3. Uterine fibroids. Electronically Signed   By: Abigail Miyamoto M.D.   On: 09/22/2015 20:53      Assessment/Plan  Lower GIB: Keep pt NPO Consult to GI in am Transfuse with 1 u PRBC  Anemia: Microcytic likely due to chronic blood loss Due to lower GIB and /or fibroids Replace 1 u PRBC to hb>8 Replace iron  She was taken Ibuprofen daily for MSK pain. Hold asp and NSAIDs for now.   HTN: continue home meds with holding parameters.   DMII: keep on low dose correction of insulin.   Input & Output:  NA Lines & Tubes: PIV DVT prophylaxis: SCDs GI prophylaxis: AntiH2 Consultants: GI Code Status: Full  Family Communication: At bedside  Disposition Plan: Admit     Gennaro Africa M.D [facility]

## 2015-09-23 NOTE — Consult Note (Signed)
CROSS COVER [facility] Reason for Consult: Anemia and rectal bleeding. Referring Physician: [facility]-Dr. Skip Estimable.  Karen Good is an 60 y.o. female.  HPI: Karen Good is 60 year old black female admitted to the hospital directly from the [facility] primary care office by Dr. Billey Gosling, when she was incidentally found to have a hemoglobin of 6.9 g/dL. Patient presented for routine office visit and while she was being evaluated, she gave a three-week history of bright red bleeding per rectum with some lower abdominal pain and cramping requiring her to take 6 Advil/Aleve per day, for the last 3 weeks. She has felt dizzy and weak. Labs revealed severe anemia and therefore she was admitted for further evaluation.  According to the chart patient a referral for colonoscopy in the past but has never had one done. She denies having any melena hematemesis or coffee-ground emesis. Appetite is fairly good and her weight's been stable. There is no history of ulcers jaundice or colitis. She denies a family history of colon cancer or inflammatory bowel disease. CT scan of the abdomen and pelvis done on admission revealed a questionable mass in the rectosigmoid colon. S.he denies having any nausea, vomiting,  dysphagia or odynophagia.  Past Medical History  Diagnosis Date  . Back strain 04/13/2010  . Diabetes mellitus, Type II 06/02/2008  . Hypertension 06/02/2008  . Hyperlipidemia 06/02/2008   Past Surgical History  Procedure Laterality Date  . S/p breast biopsy  4/07    benign   Family History  Problem Relation Age of Onset  . Diabetes Mother   . Heart disease Father   . Diabetes Father   . Hypertension Father   . Diabetes Other    Social History:  reports that she has never smoked. She does not have any smokeless tobacco history on file. She reports that she does not drink alcohol or use illicit drugs. She works at [employer].  Allergies: No Known Allergies  Medications: I have  reviewed the patient's current medications.  Results for orders placed or performed during the hospital encounter of 09/22/15 (from the past 48 hour(s))  Comprehensive metabolic panel     Status: Abnormal   Collection Time: 09/22/15  4:35 PM  Result Value Ref Range   Sodium 139 135 - 145 mmol/L   Potassium 3.0 (L) 3.5 - 5.1 mmol/L   Chloride 103 101 - 111 mmol/L   CO2 22 22 - 32 mmol/L   Glucose, Bld 168 (H) 65 - 99 mg/dL   BUN 15 6 - 20 mg/dL   Creatinine, Ser 0.68 0.44 - 1.00 mg/dL   Calcium 9.8 8.9 - 10.3 mg/dL   Total Protein 6.9 6.5 - 8.1 g/dL   Albumin 3.9 3.5 - 5.0 g/dL   AST 17 15 - 41 U/L   ALT 16 14 - 54 U/L   Alkaline Phosphatase 70 38 - 126 U/L   Total Bilirubin 0.5 0.3 - 1.2 mg/dL   GFR calc non Af Amer >60 >60 mL/min   GFR calc Af Amer >60 >60 mL/min    Comment: (NOTE) The eGFR has been calculated using the CKD EPI equation. This calculation has not been validated in all clinical situations. eGFR's persistently <60 mL/min signify possible Chronic Kidney Disease.    Anion gap 14 5 - 15  CBC     Status: Abnormal   Collection Time: 09/22/15  4:35 PM  Result Value Ref Range   WBC 8.4 4.0 - 10.5 K/uL   RBC 3.94 3.87 -  5.11 MIL/uL   Hemoglobin 7.2 (L) 12.0 - 15.0 g/dL   HCT 25.6 (L) 36.0 - 46.0 %   MCV 65.0 (L) 78.0 - 100.0 fL   MCH 18.3 (L) 26.0 - 34.0 pg   MCHC 28.1 (L) 30.0 - 36.0 g/dL   RDW 21.2 (H) 11.5 - 15.5 %   Platelets 546 (H) 150 - 400 K/uL  Type and screen Karen Good     Status: None (Preliminary result)   Collection Time: 09/22/15  4:35 PM  Result Value Ref Range   ABO/RH(D) A POS    Antibody Screen NEG    Sample Expiration 09/25/2015    Unit Number [id number]    Blood Component Type RED CELLS,LR    Unit division 00    Status of Unit ISSUED    Transfusion Status OK TO TRANSFUSE    Crossmatch Result Compatible   ABO/Rh     Status: None   Collection Time: 09/22/15  4:35 PM  Result Value Ref Range   ABO/RH(D) A POS    Vitamin B12     Status: None   Collection Time: 09/22/15  7:20 PM  Result Value Ref Range   Vitamin B-12 252 180 - 914 pg/mL    Comment: (NOTE) This assay is not validated for testing neonatal or myeloproliferative syndrome specimens for Vitamin B12 levels.   Iron and TIBC     Status: Abnormal   Collection Time: 09/22/15  7:20 PM  Result Value Ref Range   Iron 11 (L) 28 - 170 ug/dL   TIBC 671 (H) 250 - 450 ug/dL   Saturation Ratios 2 (L) 10.4 - 31.8 %   UIBC 660 ug/dL  Ferritin     Status: Abnormal   Collection Time: 09/22/15  7:20 PM  Result Value Ref Range   Ferritin 5 (L) 11 - 307 ng/mL  Reticulocytes     Status: None   Collection Time: 09/22/15  7:20 PM  Result Value Ref Range   Retic Ct Pct 1.3 0.4 - 3.1 %   RBC. 3.98 3.87 - 5.11 MIL/uL   Retic Count, Manual 51.7 19.0 - 186.0 K/uL  Folate     Status: None   Collection Time: 09/22/15  7:21 PM  Result Value Ref Range   Folate 19.3 >5.9 ng/mL  POC occult blood, ED     Status: Abnormal   Collection Time: 09/22/15  7:51 PM  Result Value Ref Range   Fecal Occult Bld POSITIVE (A) NEGATIVE  Magnesium     Status: Abnormal   Collection Time: 09/22/15 11:10 PM  Result Value Ref Range   Magnesium 1.6 (L) 1.7 - 2.4 mg/dL  Prepare RBC     Status: None   Collection Time: 09/23/15 12:02 AM  Result Value Ref Range   Order Confirmation ORDER PROCESSED BY BLOOD BANK   Hemoglobin and hematocrit, blood     Status: Abnormal   Collection Time: 09/23/15  6:58 AM  Result Value Ref Range   Hemoglobin 8.1 (L) 12.0 - 15.0 g/dL   HCT 27.4 (L) 36.0 - 46.0 %  Glucose, capillary     Status: Abnormal   Collection Time: 09/23/15  8:05 AM  Result Value Ref Range   Glucose-Capillary 129 (H) 65 - 99 mg/dL   Ct Abdomen Pelvis W Contrast  09/22/2015  CLINICAL DATA:  Hematochezia.  Hypertension and diabetes. EXAM: CT ABDOMEN AND PELVIS WITH CONTRAST TECHNIQUE: Multidetector CT imaging of the abdomen and pelvis was performed using the standard  protocol following bolus administration of intravenous contrast. CONTRAST:  1 ISOVUE-300 IOPAMIDOL (ISOVUE-300) INJECTION 61% COMPARISON:  None. FINDINGS: Lower chest: Clear lung bases. Normal heart size without pericardial or pleural effusion. Hepatobiliary: Variant lateral segment left liver lobe extending in the left upper quadrant. Normal gallbladder, without biliary ductal dilatation. Pancreas: Normal, without mass or ductal dilatation. Spleen: Normal in size, without focal abnormality. Adrenals/Urinary Tract: Normal adrenal glands. Normal kidneys, without hydronephrosis. Normal urinary bladder. Stomach/Bowel: Proximal gastric underdistention. Remainder of the stomach is unremarkable. Equivocal soft tissue fullness at the rectosigmoid junction, including on images 15- 63/series 201 and coronal image 69. The remainder of the colon and terminal ileum are unremarkable. Appendix is likely identified and normal on image 50/ series 201. Normal small bowel. Vascular/Lymphatic: Aortic and branch vessel atherosclerosis. No abdominopelvic adenopathy. Reproductive: Uterine calcifications likely relate to underlying fibroids. Fundal lesion posteriorly at approximately 3.4 cm. No adnexal mass. Other: No significant free fluid. No evidence of omental or peritoneal disease. Mild abdominal wall laxity without well-defined hernia. Musculoskeletal: No acute osseous abnormality. Mild disc bulges at L3-4 and L4-5. IMPRESSION: 1. Soft tissue fullness at the rectosigmoid junction. Although this could be related to underdistention and stool, polyp or carcinoma cannot be excluded in this patient with hematochezia. 2. Otherwise, no acute process in the abdomen or pelvis. 3. Uterine fibroids. Electronically Signed   By: Karen Good M.D.   On: 09/22/2015 20:53   Review of Systems  Constitutional: Negative.   HENT: Negative.   Eyes: Negative.   Respiratory: Negative.   Cardiovascular: Negative.   Gastrointestinal: Positive for  abdominal pain, constipation and blood in stool. Negative for heartburn, nausea, vomiting, diarrhea and melena.  Genitourinary: Negative.   Skin: Negative.   Neurological: Negative.   Endo/Heme/Allergies: Negative.   Psychiatric/Behavioral: Negative.    Blood pressure 139/63, pulse 87, temperature 98.5 F (36.9 C), temperature source Oral, resp. rate 18, height '5\' 5"'$  (1.651 m), weight 71.668 kg (158 lb), SpO2 100 %. Physical Exam  Constitutional: She is oriented to person, place, and time. She appears well-developed and well-nourished.  HENT:  Head: Normocephalic and atraumatic.  Eyes: Conjunctivae and EOM are normal. Pupils are equal, round, and reactive to light.  Neck: Normal range of motion. Neck supple.  Cardiovascular: Normal heart sounds.  Tachycardia present.   Respiratory: Effort normal and breath sounds normal.  GI: Soft. Bowel sounds are normal. There is no hepatosplenomegaly. There is tenderness in the periumbilical area. There is no CVA tenderness. No hernia. Hernia confirmed negative in the ventral area.  Musculoskeletal: Normal range of motion.  Neurological: She is alert and oriented to person, place, and time.  Skin: Skin is warm and dry.  Psychiatric: She has a normal mood and affect. Her behavior is normal. Judgment and thought content normal.   Assessment/Plan: 1) Severe iron deficiency anemia with a hemoglobin of 6.9 cramping grams per deciliter and iron of 11 and a ferritin of 5 and a history of bright red bleeding per rectum for the last [redacted] weeks along with an abnormal CT scan that shows a possible mass in the rectum/rectosigmoid colon. Plans are to proceed with a colonoscopy tomorrow patient will be prepped for the procedure today. As she has been taking several doses of nonsteroidals EGD will also be done to rule out peptic ulcer disease.  2) AODM.  3) Hypertension.  4) Hyperlipidemia.  Karen Good 09/23/2015, 11:04 AM

## 2015-09-24 ENCOUNTER — Encounter (HOSPITAL_COMMUNITY)
Admission: EM | Disposition: A | Discharge status: Home / Self Care | Payer: Self-pay | Source: Home / Self Care | Attending: Internal Medicine

## 2015-09-24 DIAGNOSIS — K921 Melena: Secondary | ICD-10-CM

## 2015-09-24 DIAGNOSIS — D649 Anemia, unspecified: Secondary | ICD-10-CM

## 2015-09-24 DIAGNOSIS — C20 Malignant neoplasm of rectum: Secondary | ICD-10-CM | POA: Insufficient documentation

## 2015-09-24 HISTORY — PX: COLONOSCOPY: SHX5424

## 2015-09-24 HISTORY — PX: ESOPHAGOGASTRODUODENOSCOPY: SHX5428

## 2015-09-24 LAB — COMPREHENSIVE METABOLIC PANEL
ALBUMIN: 2.9 g/dL — AB (ref 3.5–5.0)
ALK PHOS: 56 U/L (ref 38–126)
ALT: 12 U/L — AB (ref 14–54)
AST: 13 U/L — AB (ref 15–41)
Anion gap: 7 (ref 5–15)
BUN: <5 mg/dL — ABNORMAL LOW (ref 6–20)
CALCIUM: 8.7 mg/dL — AB (ref 8.9–10.3)
CHLORIDE: 111 mmol/L (ref 101–111)
CO2: 25 mmol/L (ref 22–32)
Creatinine, Ser: 0.54 mg/dL (ref 0.44–1.00)
GFR calc non Af Amer: >60 mL/min (ref >60)
Glucose, Bld: 128 mg/dL — ABNORMAL HIGH (ref 65–99)
Potassium: 3.7 mmol/L (ref 3.5–5.1)
SODIUM: 143 mmol/L (ref 135–145)
Total Bilirubin: 0.7 mg/dL (ref 0.3–1.2)
Total Protein: 5.3 g/dL — ABNORMAL LOW (ref 6.5–8.1)

## 2015-09-24 LAB — CBC
HCT: 25.1 % — ABNORMAL LOW (ref 36.0–46.0)
HEMOGLOBIN: 7.3 g/dL — AB (ref 12.0–15.0)
MCH: 20.4 pg — ABNORMAL LOW (ref 26.0–34.0)
MCHC: 29.1 g/dL — ABNORMAL LOW (ref 30.0–36.0)
MCV: 69 fL — ABNORMAL LOW (ref 78.0–100.0)
PLATELETS: 401 10*3/uL — AB (ref 150–400)
RBC: 3.58 MIL/uL — AB (ref 3.87–5.11)
RDW: 23 % — ABNORMAL HIGH (ref 11.5–15.5)
WBC: 4.3 10*3/uL (ref 4.0–10.5)

## 2015-09-24 LAB — GLUCOSE, CAPILLARY
GLUCOSE-CAPILLARY: 145 mg/dL — AB (ref 65–99)
GLUCOSE-CAPILLARY: 128 mg/dL — AB (ref 65–99)
Glucose-Capillary: 198 mg/dL — ABNORMAL HIGH (ref 65–99)
Glucose-Capillary: 119 mg/dL — ABNORMAL HIGH (ref 65–99)

## 2015-09-24 LAB — PREPARE RBC (CROSSMATCH)

## 2015-09-24 SURGERY — COLONOSCOPY
Anesthesia: Moderate Sedation

## 2015-09-24 MED ORDER — FENTANYL CITRATE (PF) 100 MCG/2ML IJ SOLN
INTRAMUSCULAR | Status: AC
  Filled 2015-09-24: qty 2

## 2015-09-24 MED ORDER — MIDAZOLAM HCL 10 MG/2ML IJ SOLN
INTRAMUSCULAR | Status: DC | PRN
Start: 2015-09-24 — End: 2015-09-24
  Administered 2015-09-24 (×4): 2 mg via INTRAVENOUS

## 2015-09-24 MED ORDER — DIPHENHYDRAMINE HCL 50 MG/ML IJ SOLN
INTRAMUSCULAR | Status: DC | PRN
  Administered 2015-09-24: 50 mg via INTRAVENOUS

## 2015-09-24 MED ORDER — LIDOCAINE VISCOUS 2 % MT SOLN
OROMUCOSAL | Status: DC | PRN
  Administered 2015-09-24: 1 via OROMUCOSAL

## 2015-09-24 MED ORDER — SODIUM CHLORIDE 0.9 % IV SOLN
Freq: Once | INTRAVENOUS | Status: AC
  Administered 2015-09-24: 250 mL via INTRAVENOUS

## 2015-09-24 MED ORDER — CLONAZEPAM 0.5 MG PO TABS
0.5000 mg | ORAL_TABLET | Freq: Two times a day (BID) | ORAL | Status: DC | PRN
  Administered 2015-09-24: 0.5 mg via ORAL
  Filled 2015-09-24: qty 1

## 2015-09-24 MED ORDER — MIDAZOLAM HCL 5 MG/ML IJ SOLN
INTRAMUSCULAR | Status: AC
  Filled 2015-09-24: qty 2

## 2015-09-24 MED ORDER — SODIUM CHLORIDE 0.9 % IV SOLN
125.0000 mg | Freq: Once | INTRAVENOUS | Status: DC
  Filled 2015-09-24: qty 10

## 2015-09-24 MED ORDER — FENTANYL CITRATE (PF) 100 MCG/2ML IJ SOLN
INTRAMUSCULAR | Status: DC | PRN
  Administered 2015-09-24 (×3): 25 ug via INTRAVENOUS

## 2015-09-24 MED ORDER — DIPHENHYDRAMINE HCL 50 MG/ML IJ SOLN
INTRAMUSCULAR | Status: AC
  Filled 2015-09-24: qty 1

## 2015-09-24 MED ORDER — LIDOCAINE VISCOUS 2 % MT SOLN
OROMUCOSAL | Status: AC
  Filled 2015-09-24: qty 15

## 2015-09-24 MED ORDER — SODIUM CHLORIDE 0.9 % IV SOLN: INTRAVENOUS | Status: DC

## 2015-09-24 MED ORDER — ATENOLOL 12.5 MG HALF TABLET
12.5000 mg | ORAL_TABLET | Freq: Two times a day (BID) | ORAL | Status: DC
  Administered 2015-09-25 – 2015-09-26 (×4): 12.5 mg via ORAL
  Filled 2015-09-24 (×6): qty 1

## 2015-09-24 MED ORDER — MIDAZOLAM HCL 5 MG/5ML IJ SOLN: INTRAMUSCULAR | Status: DC | PRN

## 2015-09-24 MED ORDER — SODIUM CHLORIDE 0.9 % IV SOLN
125.0000 mg | Freq: Once | INTRAVENOUS | Status: AC
  Administered 2015-09-24: 125 mg via INTRAVENOUS
  Filled 2015-09-24: qty 10

## 2015-09-24 NOTE — Progress Notes (Signed)
[facility] PROGRESS NOTE  Karen Good [id number] DOB: 1956/03/28 DOA: 09/22/2015   PCP: Cathlean Cower, MD     Assessment/Plan: Active Problems:   Lower GI bleed   Hematochezia   Absolute anemia   60 yo female with history of HTN,DMII who came with cc of hematochezia for almost a month with recurrent red blood per rectum without pain. No melena. No hematemesis or hematuria or hemoptysis.she went for her regular checkup and was found to be anemic with a hemoglobin of 6.9.She's noticed bright red blood per rectum with normally formed stool over the course of one month.she has never had a colonoscopy. Patient has ibuprofen which she takes for muscular skeletal pain, she takes this daily 3x 200 mg tabs in the a.m. and 3 in the PM. This is the only NSAID that she takes, she does not drink alcohol. No easy bruising/bleeding/night sweats/unintentional weight loss. CT abdomen pelvis shows fullness in the rectosigmoid junction concerning for polyp versus carcinoma. Dr. Collene Mares from gastroenterology has been consulted. Patient is status post receiving 1 unit of packed red blood cells  Assessment and plan Microcytic anemia/rectosigmoid mass Patient scheduled to have EGD and colonoscopy today Continue to replace with IV iron Hemoglobin has trended down, transfuse  second unit of packed red blood cells today  Hypertension -stable, continue Norvasc , restart low-dose atenolol , holding HCTZ, Avapro,  Dyslipidemia-stable, continue Lipitor  Diabetes mellitus , holding glipizide, janumet,  , continue sliding scale insulin , hemoglobin A1c 5.9    DVT prophylaxsis SCDs  Code Status:  Full code      Family Communication: Discussed in detail with the patient, all imaging results, lab results explained to the patient   Disposition Plan:  Anticipate discharge in 2-3 days     Consultants:  Gastroenterology  Procedures:  None  Antibiotics: Anti-infectives    None          HPI/Subjective:  Currently nothing by mouth, denies any episodes of hematochezia overnight   Objective: Filed Vitals:   09/23/15 1138 09/23/15 1418 09/23/15 2122 09/24/15 0552  BP: 137/52 135/59 141/69 154/67  Pulse: 87 100 98 108  Temp:  98.1 F (36.7 C)  98.3 F (36.8 C)  TempSrc:      Resp:  17 18 18   Height:      Weight:      SpO2:  100% 97% 100%    Intake/Output Summary (Last 24 hours) at 09/24/15 1142 Last data filed at 09/24/15 0920  Gross per 24 hour  Intake 2463.34 ml  Output   1300 ml  Net 1163.34 ml    Exam:  Examination:  General exam: Appears calm and comfortable  Respiratory system: Clear to auscultation. Respiratory effort normal. Cardiovascular system: S1 & S2 heard, RRR. No JVD, murmurs, rubs, gallops or clicks. No pedal edema. Gastrointestinal system: Abdomen is nondistended, soft and nontender. No organomegaly or masses felt. Normal bowel sounds heard. Central nervous system: Alert and oriented. No focal neurological deficits. Extremities: Symmetric 5 x 5 power. Skin: No rashes, lesions or ulcers Psychiatry: Judgement and insight appear normal. Mood & affect appropriate.     Data Reviewed: I have personally reviewed following labs and imaging studies  Micro Results No results found for this or any previous visit (from the past 240 hour(s)).  Radiology Reports Ct Abdomen Pelvis W Contrast  09/22/2015  CLINICAL DATA:  Hematochezia.  Hypertension and diabetes. EXAM: CT ABDOMEN AND PELVIS WITH CONTRAST TECHNIQUE: Multidetector CT imaging  of the abdomen and pelvis was performed using the standard protocol following bolus administration of intravenous contrast. CONTRAST:  1 ISOVUE-300 IOPAMIDOL (ISOVUE-300) INJECTION 61% COMPARISON:  None. FINDINGS: Lower chest: Clear lung bases. Normal heart size without pericardial or pleural effusion. Hepatobiliary: Variant lateral segment left liver lobe extending in the left upper quadrant. Normal  gallbladder, without biliary ductal dilatation. Pancreas: Normal, without mass or ductal dilatation. Spleen: Normal in size, without focal abnormality. Adrenals/Urinary Tract: Normal adrenal glands. Normal kidneys, without hydronephrosis. Normal urinary bladder. Stomach/Bowel: Proximal gastric underdistention. Remainder of the stomach is unremarkable. Equivocal soft tissue fullness at the rectosigmoid junction, including on images 15- 63/series 201 and coronal image 69. The remainder of the colon and terminal ileum are unremarkable. Appendix is likely identified and normal on image 50/ series 201. Normal small bowel. Vascular/Lymphatic: Aortic and branch vessel atherosclerosis. No abdominopelvic adenopathy. Reproductive: Uterine calcifications likely relate to underlying fibroids. Fundal lesion posteriorly at approximately 3.4 cm. No adnexal mass. Other: No significant free fluid. No evidence of omental or peritoneal disease. Mild abdominal wall laxity without well-defined hernia. Musculoskeletal: No acute osseous abnormality. Mild disc bulges at L3-4 and L4-5. IMPRESSION: 1. Soft tissue fullness at the rectosigmoid junction. Although this could be related to underdistention and stool, polyp or carcinoma cannot be excluded in this patient with hematochezia. 2. Otherwise, no acute process in the abdomen or pelvis. 3. Uterine fibroids. Electronically Signed   By: Abigail Miyamoto M.D.   On: 09/22/2015 20:53     CBC  Recent Labs Lab 09/22/15 1142 09/22/15 1635 09/23/15 0658 09/24/15 0526  WBC 5.1 8.4  --  4.3  HGB 6.9 cL* 7.2* 8.1* 7.3*  HCT 22.8 Repeated and verified X2.* 25.6* 27.4* 25.1*  PLT 479.0* 546*  --  401*  MCV 63.8 Repeated and verified X2.* 65.0*  --  69.0*  MCH  --  18.3*  --  20.4*  MCHC 30.3 28.1*  --  29.1*  RDW 23.3* 21.2*  --  23.0*  LYMPHSABS 1.2  --   --   --   MONOABS 0.4  --   --   --   EOSABS 0.1  --   --   --   BASOSABS 0.0  --   --   --     Chemistries   Recent Labs Lab  09/22/15 1142 09/22/15 1635 09/22/15 2310 09/24/15 0526  NA 140 139  --  143  K 3.5 3.0*  --  3.7  CL 103 103  --  111  CO2 29 22  --  25  GLUCOSE 132* 168*  --  128*  BUN 16 15  --  <5*  CREATININE 0.57 0.68  --  0.54  CALCIUM 9.6 9.8  --  8.7*  MG  --   --  1.6*  --   AST 12 17  --  13*  ALT 10 16  --  12*  ALKPHOS 65 70  --  56  BILITOT 0.5 0.5  --  0.7   ------------------------------------------------------------------------------------------------------------------ estimated creatinine clearance is 73.3 mL/min (by C-G formula based on Cr of 0.54). ------------------------------------------------------------------------------------------------------------------  Recent Labs  09/22/15 1142  HGBA1C 5.9   ------------------------------------------------------------------------------------------------------------------  Recent Labs  09/22/15 1142  CHOL 157  HDL 48.60  LDLCALC 93  TRIG 78.0  CHOLHDL 3   ------------------------------------------------------------------------------------------------------------------  Recent Labs  09/22/15 1142  TSH 1.92   ------------------------------------------------------------------------------------------------------------------  Recent Labs  09/22/15 1920 09/22/15 1921  VITAMINB12 252  --   FOLATE  --  19.3  FERRITIN 5*  --   TIBC 671*  --   IRON 11*  --   RETICCTPCT 1.3  --     Coagulation profile No results for input(s): INR, PROTIME in the last 168 hours.  No results for input(s): DDIMER in the last 72 hours.  Cardiac Enzymes No results for input(s): CKMB, TROPONINI, MYOGLOBIN in the last 168 hours.  Invalid input(s): CK ------------------------------------------------------------------------------------------------------------------ Invalid input(s): POCBNP   CBG:  Recent Labs Lab 09/23/15 0805 09/23/15 1210 09/23/15 1638 09/23/15 2120 09/24/15 0744  GLUCAP 129* 161* 128* 125* 145*        Studies: Ct Abdomen Pelvis W Contrast  09/22/2015  CLINICAL DATA:  Hematochezia.  Hypertension and diabetes. EXAM: CT ABDOMEN AND PELVIS WITH CONTRAST TECHNIQUE: Multidetector CT imaging of the abdomen and pelvis was performed using the standard protocol following bolus administration of intravenous contrast. CONTRAST:  1 ISOVUE-300 IOPAMIDOL (ISOVUE-300) INJECTION 61% COMPARISON:  None. FINDINGS: Lower chest: Clear lung bases. Normal heart size without pericardial or pleural effusion. Hepatobiliary: Variant lateral segment left liver lobe extending in the left upper quadrant. Normal gallbladder, without biliary ductal dilatation. Pancreas: Normal, without mass or ductal dilatation. Spleen: Normal in size, without focal abnormality. Adrenals/Urinary Tract: Normal adrenal glands. Normal kidneys, without hydronephrosis. Normal urinary bladder. Stomach/Bowel: Proximal gastric underdistention. Remainder of the stomach is unremarkable. Equivocal soft tissue fullness at the rectosigmoid junction, including on images 15- 63/series 201 and coronal image 69. The remainder of the colon and terminal ileum are unremarkable. Appendix is likely identified and normal on image 50/ series 201. Normal small bowel. Vascular/Lymphatic: Aortic and branch vessel atherosclerosis. No abdominopelvic adenopathy. Reproductive: Uterine calcifications likely relate to underlying fibroids. Fundal lesion posteriorly at approximately 3.4 cm. No adnexal mass. Other: No significant free fluid. No evidence of omental or peritoneal disease. Mild abdominal wall laxity without well-defined hernia. Musculoskeletal: No acute osseous abnormality. Mild disc bulges at L3-4 and L4-5. IMPRESSION: 1. Soft tissue fullness at the rectosigmoid junction. Although this could be related to underdistention and stool, polyp or carcinoma cannot be excluded in this patient with hematochezia. 2. Otherwise, no acute process in the abdomen or pelvis. 3. Uterine  fibroids. Electronically Signed   By: Abigail Miyamoto M.D.   On: 09/22/2015 20:53      Lab Results  Component Value Date   HGBA1C 5.9 09/22/2015   HGBA1C 10.0* 07/11/2014   HGBA1C 10.3* 01/26/2013   Lab Results  Component Value Date   MICROALBUR 8.3* 09/22/2015   LDLCALC 93 09/22/2015   CREATININE 0.54 09/24/2015       Scheduled Meds: . sodium chloride   Intravenous Once  . amLODipine  10 mg Oral Daily  . atorvastatin  20 mg Oral Daily  . famotidine (PEPCID) IV  20 mg Intravenous Q12H  . ferric gluconate (FERRLECIT/NULECIT) IV  125 mg Intravenous Once  . insulin aspart  0-9 Units Subcutaneous TID WC  . irbesartan  300 mg Oral Daily   Continuous Infusions: . 0.9 % sodium chloride with kcl 100 mL/hr at 09/24/15 0055     LOS: 2 days    Time spent: >30 MINS    [facility] Pager [phone]. If 7PM-7AM, please contact night-coverage at [email or web address], password [facility] 09/24/2015, 11:42 AM  LOS: 2 days

## 2015-09-24 NOTE — Consult Note (Signed)
Reason for Consult:Rectal mass  Referring Physician: Kamorah Good [name] is an 60 y.o. female.  HPI: Karen Good has been having blood with bowel movements for about a month. She has intermittent constipation. She has been feeling weak lately. She was found to have a significant anemia. Workup included gastroenterology evaluation by Dr. Collene Mares. She underwent colonoscopy today reportedly showing a rectal mass. Dr. Collene Mares asked [place] to see her in consultation regarding surgical management. Karen Good is still a little sleepy after her procedure but is able to assist with her history. Her husband also contributes.  Past Medical History  Diagnosis Date  . BACK PAIN 04/13/2010  . BACK STRAIN, LUMBAR 04/13/2010  . DIABETES MELLITUS, TYPE II 06/02/2008  . HYPERLIPIDEMIA 06/02/2008  . HYPERTENSION 06/02/2008    Past Surgical History  Procedure Laterality Date  . S/p breast biopsy  4/07    benign    Family History  Problem Relation Age of Onset  . Diabetes Mother   . Heart disease Father   . Diabetes Father   . Hypertension Father   . Diabetes Other     Social History:  reports that she has never smoked. She does not have any smokeless tobacco history on file. She reports that she does not drink alcohol or use illicit drugs.  Allergies: No Known Allergies  Medications:  Scheduled: . sodium chloride   Intravenous Once  . amLODipine  10 mg Oral Daily  . atenolol  12.5 mg Oral BID  . atorvastatin  20 mg Oral Daily  . famotidine (PEPCID) IV  20 mg Intravenous Q12H  . ferric gluconate (FERRLECIT/NULECIT) IV  125 mg Intravenous Once  . insulin aspart  0-9 Units Subcutaneous TID WC  . irbesartan  300 mg Oral Daily   Continuous: . sodium chloride    . 0.9 % sodium chloride with kcl 100 mL/hr at 09/24/15 1204   GYF:[id number]  Results for orders placed or performed during the hospital encounter of 09/22/15 (from the past 48 hour(s))  Comprehensive metabolic panel     Status:  Abnormal   Collection Time: 09/22/15  4:35 PM  Result Value Ref Range   Sodium 139 135 - 145 mmol/L   Potassium 3.0 (L) 3.5 - 5.1 mmol/L   Chloride 103 101 - 111 mmol/L   CO2 22 22 - 32 mmol/L   Glucose, Bld 168 (H) 65 - 99 mg/dL   BUN 15 6 - 20 mg/dL   Creatinine, Ser 0.68 0.44 - 1.00 mg/dL   Calcium 9.8 8.9 - 10.3 mg/dL   Total Protein 6.9 6.5 - 8.1 g/dL   Albumin 3.9 3.5 - 5.0 g/dL   AST 17 15 - 41 U/L   ALT 16 14 - 54 U/L   Alkaline Phosphatase 70 38 - 126 U/L   Total Bilirubin 0.5 0.3 - 1.2 mg/dL   GFR calc non Af Amer >60 >60 mL/min   GFR calc Af Amer >60 >60 mL/min    Comment: (NOTE) The eGFR has been calculated using the CKD EPI equation. This calculation has not been validated in all clinical situations. eGFR's persistently <60 mL/min signify possible Chronic Kidney Disease.    Anion gap 14 5 - 15  CBC     Status: Abnormal   Collection Time: 09/22/15  4:35 PM  Result Value Ref Range   WBC 8.4 4.0 - 10.5 K/uL   RBC 3.94 3.87 - 5.11 MIL/uL   Hemoglobin 7.2 (L) 12.0 - 15.0 g/dL   HCT 25.6 (  L) 36.0 - 46.0 %   MCV 65.0 (L) 78.0 - 100.0 fL   MCH 18.3 (L) 26.0 - 34.0 pg   MCHC 28.1 (L) 30.0 - 36.0 g/dL   RDW 09.6 (H) 50.9 - 81.0 %   Platelets 546 (H) 150 - 400 K/uL  Type and screen [facility]     Status: None (Preliminary result)   Collection Time: 09/22/15  4:35 PM  Result Value Ref Range   ABO/RH(D) A POS    Antibody Screen NEG    Sample Expiration 09/25/2015    Unit Number [id number]    Blood Component Type RED CELLS,LR    Unit division 00    Status of Unit ISSUED,FINAL    Transfusion Status OK TO TRANSFUSE    Crossmatch Result Compatible    Unit Number [id number]    Blood Component Type RED CELLS,LR    Unit division 00    Status of Unit ALLOCATED    Transfusion Status OK TO TRANSFUSE    Crossmatch Result Compatible   ABO/Rh     Status: None   Collection Time: 09/22/15  4:35 PM  Result Value Ref Range   ABO/RH(D) A POS   Vitamin  B12     Status: None   Collection Time: 09/22/15  7:20 PM  Result Value Ref Range   Vitamin B-12 252 180 - 914 pg/mL    Comment: (NOTE) This assay is not validated for testing neonatal or myeloproliferative syndrome specimens for Vitamin B12 levels.   Iron and TIBC     Status: Abnormal   Collection Time: 09/22/15  7:20 PM  Result Value Ref Range   Iron 11 (L) 28 - 170 ug/dL   TIBC 188 (H) 755 - 676 ug/dL   Saturation Ratios 2 (L) 10.4 - 31.8 %   UIBC 660 ug/dL  Ferritin     Status: Abnormal   Collection Time: 09/22/15  7:20 PM  Result Value Ref Range   Ferritin 5 (L) 11 - 307 ng/mL  Reticulocytes     Status: None   Collection Time: 09/22/15  7:20 PM  Result Value Ref Range   Retic Ct Pct 1.3 0.4 - 3.1 %   RBC. 3.98 3.87 - 5.11 MIL/uL   Retic Count, Manual 51.7 19.0 - 186.0 K/uL  Folate     Status: None   Collection Time: 09/22/15  7:21 PM  Result Value Ref Range   Folate 19.3 >5.9 ng/mL  POC occult blood, ED     Status: Abnormal   Collection Time: 09/22/15  7:51 PM  Result Value Ref Range   Fecal Occult Bld POSITIVE (A) NEGATIVE  Magnesium     Status: Abnormal   Collection Time: 09/22/15 11:10 PM  Result Value Ref Range   Magnesium 1.6 (L) 1.7 - 2.4 mg/dL  Prepare RBC     Status: None   Collection Time: 09/23/15 12:02 AM  Result Value Ref Range   Order Confirmation ORDER PROCESSED BY BLOOD BANK   Hemoglobin and hematocrit, blood     Status: Abnormal   Collection Time: 09/23/15  6:58 AM  Result Value Ref Range   Hemoglobin 8.1 (L) 12.0 - 15.0 g/dL   HCT 15.9 (L) 18.4 - 74.9 %  Glucose, capillary     Status: Abnormal   Collection Time: 09/23/15  8:05 AM  Result Value Ref Range   Glucose-Capillary 129 (H) 65 - 99 mg/dL  Glucose, capillary     Status: Abnormal   Collection Time:  09/23/15 12:10 PM  Result Value Ref Range   Glucose-Capillary 161 (H) 65 - 99 mg/dL  Glucose, capillary     Status: Abnormal   Collection Time: 09/23/15  4:38 PM  Result Value Ref Range    Glucose-Capillary 128 (H) 65 - 99 mg/dL  Glucose, capillary     Status: Abnormal   Collection Time: 09/23/15  9:20 PM  Result Value Ref Range   Glucose-Capillary 125 (H) 65 - 99 mg/dL  CBC     Status: Abnormal   Collection Time: 09/24/15  5:26 AM  Result Value Ref Range   WBC 4.3 4.0 - 10.5 K/uL   RBC 3.58 (L) 3.87 - 5.11 MIL/uL   Hemoglobin 7.3 (L) 12.0 - 15.0 g/dL   HCT 25.1 (L) 36.0 - 46.0 %   MCV 69.0 (L) 78.0 - 100.0 fL   MCH 20.4 (L) 26.0 - 34.0 pg   MCHC 29.1 (L) 30.0 - 36.0 g/dL   RDW 23.0 (H) 11.5 - 15.5 %   Platelets 401 (H) 150 - 400 K/uL  Comprehensive metabolic panel     Status: Abnormal   Collection Time: 09/24/15  5:26 AM  Result Value Ref Range   Sodium 143 135 - 145 mmol/L   Potassium 3.7 3.5 - 5.1 mmol/L   Chloride 111 101 - 111 mmol/L   CO2 25 22 - 32 mmol/L   Glucose, Bld 128 (H) 65 - 99 mg/dL   BUN <5 (L) 6 - 20 mg/dL   Creatinine, Ser 0.54 0.44 - 1.00 mg/dL   Calcium 8.7 (L) 8.9 - 10.3 mg/dL   Total Protein 5.3 (L) 6.5 - 8.1 g/dL   Albumin 2.9 (L) 3.5 - 5.0 g/dL   AST 13 (L) 15 - 41 U/L   ALT 12 (L) 14 - 54 U/L   Alkaline Phosphatase 56 38 - 126 U/L   Total Bilirubin 0.7 0.3 - 1.2 mg/dL   GFR calc non Af Amer >60 >60 mL/min   GFR calc Af Amer >60 >60 mL/min    Comment: (NOTE) The eGFR has been calculated using the CKD EPI equation. This calculation has not been validated in all clinical situations. eGFR's persistently <60 mL/min signify possible Chronic Kidney Disease.    Anion gap 7 5 - 15  Glucose, capillary     Status: Abnormal   Collection Time: 09/24/15  7:44 AM  Result Value Ref Range   Glucose-Capillary 145 (H) 65 - 99 mg/dL  Prepare RBC     Status: None   Collection Time: 09/24/15 11:24 AM  Result Value Ref Range   Order Confirmation ORDER PROCESSED BY BLOOD BANK   Glucose, capillary     Status: Abnormal   Collection Time: 09/24/15 12:05 PM  Result Value Ref Range   Glucose-Capillary 128 (H) 65 - 99 mg/dL    Ct Abdomen Pelvis W  Contrast  09/22/2015  CLINICAL DATA:  Hematochezia.  Hypertension and diabetes. EXAM: CT ABDOMEN AND PELVIS WITH CONTRAST TECHNIQUE: Multidetector CT imaging of the abdomen and pelvis was performed using the standard protocol following bolus administration of intravenous contrast. CONTRAST:  1 ISOVUE-300 IOPAMIDOL (ISOVUE-300) INJECTION 61% COMPARISON:  None. FINDINGS: Lower chest: Clear lung bases. Normal heart size without pericardial or pleural effusion. Hepatobiliary: Variant lateral segment left liver lobe extending in the left upper quadrant. Normal gallbladder, without biliary ductal dilatation. Pancreas: Normal, without mass or ductal dilatation. Spleen: Normal in size, without focal abnormality. Adrenals/Urinary Tract: Normal adrenal glands. Normal kidneys, without hydronephrosis. Normal urinary bladder.  Stomach/Bowel: Proximal gastric underdistention. Remainder of the stomach is unremarkable. Equivocal soft tissue fullness at the rectosigmoid junction, including on images 15- 63/series 201 and coronal image 69. The remainder of the colon and terminal ileum are unremarkable. Appendix is likely identified and normal on image 50/ series 201. Normal small bowel. Vascular/Lymphatic: Aortic and branch vessel atherosclerosis. No abdominopelvic adenopathy. Reproductive: Uterine calcifications likely relate to underlying fibroids. Fundal lesion posteriorly at approximately 3.4 cm. No adnexal mass. Other: No significant free fluid. No evidence of omental or peritoneal disease. Mild abdominal wall laxity without well-defined hernia. Musculoskeletal: No acute osseous abnormality. Mild disc bulges at L3-4 and L4-5. IMPRESSION: 1. Soft tissue fullness at the rectosigmoid junction. Although this could be related to underdistention and stool, polyp or carcinoma cannot be excluded in this patient with hematochezia. 2. Otherwise, no acute process in the abdomen or pelvis. 3. Uterine fibroids. Electronically Signed   By:  Abigail Miyamoto M.D.   On: 09/22/2015 20:53    Review of Systems  Constitutional: Positive for malaise/fatigue.  HENT: Negative.   Eyes: Negative.   Respiratory: Negative for cough.   Cardiovascular: Negative for chest pain.  Gastrointestinal: Positive for constipation and blood in stool. Negative for nausea, vomiting, abdominal pain and diarrhea.  Genitourinary: Negative.   Musculoskeletal: Negative.   Skin: Negative.   Neurological: Negative.   Endo/Heme/Allergies: Negative.   Psychiatric/Behavioral: Negative.    Blood pressure 142/58, pulse 102, temperature 98.3 F (36.8 C), temperature source Oral, resp. rate 18, height '5\' 5"'$  (1.651 m), weight 71.668 kg (158 lb), SpO2 99 %. Physical Exam  Constitutional: She is oriented to person, place, and time. She appears well-developed and well-nourished. No distress.  HENT:  Head: Normocephalic and atraumatic.  Right Ear: External ear normal.  Left Ear: External ear normal.  Nose: Nose normal.  Mouth/Throat: Oropharynx is clear and moist.  Eyes: EOM are normal. Pupils are equal, round, and reactive to light.  Neck: Neck supple. No tracheal deviation present.  Cardiovascular: Normal rate, normal heart sounds and intact distal pulses.   Respiratory: Effort normal and breath sounds normal. No stridor. No respiratory distress. She has no wheezes. She has no rales.  GI: Soft. She exhibits no distension and no mass. There is no tenderness. There is no rebound and no guarding.  Genitourinary:  Deferred, just got back from endoscopy  Musculoskeletal: Normal range of motion. She exhibits no tenderness.  Neurological: She is alert and oriented to person, place, and time. She exhibits normal muscle tone.  Skin: Skin is warm.  Psychiatric: She has a normal mood and affect.    Assessment/Plan: Rectal mass - agree with checking tumor markers, will also check CT chest. CT A/P reviewed. Biopsy pathology is pending. I discussed the high likelihood that  this is a malignancy with Raynesha and her husband. She will enter into the rectal cancer pathway pending the results of the above studies. We will follow closely.  Karen Good E 09/24/2015, 2:20 PM

## 2015-09-24 NOTE — Op Note (Signed)
[facility] Patient Name: Karen Good Procedure Date : 09/24/2015 MRN: [id number] Attending MD: Juanita Craver , MD Date of Birth: 1956-02-24 CSN: [id number] Age: 60 Admit Type: Inpatient Procedure:                Colonoscopy with multiple cold biopsies. Indications:              Severe iron deficiency anemia; Rectal bleeding;                            Abnormal CT scan ?rectosigmoid mass; CRC screening                            for colorectal malignant neoplasm Providers:                Juanita Craver, MD, Alinda Deem, RN, Corliss Parish, Technician. Referring MD:             Binnie Rail, MD Medicines:                Midazolam 2 mg IV. Complications:            No immediate complications. Estimated Blood Loss:     Estimated blood loss was minimal. Procedure:                Pre-anesthesia assessment: - Prior to the                            procedure, a history and physical was performed,                            and patient medications and allergies were                            reviewed. The patient's tolerance of previous                            anesthesia was also reviewed. The risks and                            benefits of the procedure and the sedation options                            and risks were discussed with the patient. All                            questions were answered, and informed consent was                            obtained. Prior anticoagulants: The patient has                            taken previous NSAID medication, last dose was 3  days prior to procedure. ASA Grade assessment: II -                            A patient with mild systemic disease. After                            reviewing the risks and benefits, the patient was                            deemed in satisfactory condition to undergo the                            procedure. After obtaining informed consent,  the                            colonoscope was passed under direct vision.                            Throughout the procedure, the patient's blood                            pressure, pulse, and oxygen saturations were                            monitored continuously. The [id number]                            scope was introduced through the anus and advanced                            to the the cecum, identified by the appendiceal                            orifice, IC valve and transillumination. The                            colonoscopy was performed without difficulty. The                            patient tolerated the procedure well. The quality                            of the bowel preparation was good. The ileocecal                            valve, the appendiceal orifice and the rectum were                            photographed. The bowel preparation used was                            GoLYTELY. Scope In: 1:11:00 PM Scope Out: 1:29:05 PM Scope Withdrawal Time: 0 hours 10 minutes 28 seconds  Total Procedure Duration: 0  hours 18 minutes 5 seconds  Findings:      A large, polypoid, circumferential, non-obstructing mass was found in       the recto-sigmoid colon from 10-20 cm; multiple cold biopsies were done.      The exam was otherwise without abnormality on direct and retroflexion       views. Impression:               - Large circumferental polypoid mass in the                            rectosigmoid colon from 10-20 cm-biopsies done. Moderate Sedation:      Moderate (conscious) sedation was administered by the endoscopy nurse       and supervised by the endoscopist. The following parameters were       monitored: oxygen saturation, heart rate, blood pressure, respiratory       rate and response to care. Total physician intraservice time was 28       minutes. Recommendation:           - Full liquid diet today.                           - Continue present  medications.                           - No Ibuprofen, Naproxen, or other non-steroidal                            anti-inflammatory drugs for now.                           - Repeat colonoscopy in 1 year for surveillance.                           - Refer to a [name] today-Dr. [name]                            called-he will see her today.                           - Check CEA levels today. Procedure Code(s):        --- Professional ---                           [phone], Colonoscopy, flexible; with biopsy, single                            or multiple Diagnosis Code(s):        --- Professional ---                           D50.9, Iron deficiency anemia, unspecified                           K92.1, Melena (includes Hematochezia)                           R93.3, Abnormal  findings on diagnostic imaging of                            other parts of digestive tract                           Z12.11, Encounter for screening for malignant                            neoplasm of colon CPT copyright 2016 [employer]. All rights reserved. The codes documented in this report are preliminary and upon coder review may  be revised to meet current compliance requirements. Juanita Craver, MD Juanita Craver, MD 09/24/2015 1:58:22 PM This report has been signed electronically. Number of Addenda: 0

## 2015-09-24 NOTE — Op Note (Signed)
[facility] Patient Name: Karen Good Procedure Date : 09/24/2015 MRN: [id number] Attending MD: Juanita Craver , MD Date of Birth: June 02, 1955 CSN: [id number] Age: 60 Admit Type: Inpatient Procedure:                Diagnostic EGD. Indications:              Iron deficiency anemia, Hematochezia. Providers:                Juanita Craver, MD, Malka So, RN, Corliss Parish, Technician. Referring MD:             Binnie Rail, MD Medicines:                Fentanyl 100 mcg, Midazolam 8 mg & Diphenhydramine                            50 mg IV & Viscous lidocaine 20 cc PO. Complications:            No immediate complications. Estimated Blood Loss:     Estimated blood loss: none. Procedure:                Pre-anesthesia assessment: - Prior to the                            procedure, a history and physical was performed,                            and patient medications and allergies were                            reviewed. The patient's tolerance of previous                            anesthesia was also reviewed. The risks and                            benefits of the procedure and the sedation options                            and risks were discussed with the patient. All                            questions were answered, and informed consent was                            obtained. Prior Anticoagulants: The patient has                            taken previous NSAID medication, last dose was 3                            days prior to procedure. ASA Grade Assessment: II -  A patient with mild systemic disease. After                            reviewing the risks and benefits, the patient was                            deemed in satisfactory condition to undergo the                            procedure. After obtaining informed consent, the                            endoscope was passed under direct vision.                       Throughout the procedure, the patient's blood                            pressure, pulse, and oxygen saturations were                            monitored continuously. The [id number]                            scope was introduced through the mouth, and                            advanced to the second part of duodenum. The EGD                            was accomplished without difficulty. The patient                            tolerated the procedure well. Scope In: Scope Out: Findings:      The examined esophagus and GEJ appeared widely patent and normal.      A small hiatal hernia was noted on retroflexion.      Mild inflammation was found in the gastric antrum; the rest of the       stomach appeared normal..      The examined duodenum was normal. Impression:               - Normal appearing, widely patent esophagus and GEJ.                           - Small hiatal hernia noted on retroflexion.                           - Mild antral gastritis-otherwise normal appearing                            stomach.                           - Normal examined duodenum.                           -  No specimens collected. Moderate Sedation:      Moderate (conscious) sedation was administered by the endoscopy nurse       and supervised by the endoscopist. The following parameters were       monitored: oxygen saturation, heart rate, blood pressure, respiratory       rate and response to care. Total physician intraservice time was 7       minutes. Recommendation:           - Full liquids for now.                           - Continue present medications.                           - No Aspirin, Ibuprofen, naproxen, or other                            non-steroidal anti-inflammatory drugs for 8 weeks. Procedure Code(s):        --- Professional ---                           [phone], Esophagogastroduodenoscopy, flexible,                            transoral; diagnostic,  including collection of                            specimen(s) by brushing or washing, when performed                            (separate procedure) Diagnosis Code(s):        --- Professional ---                           D50.9, Iron deficiency anemia, unspecified                           K92.1, Melena (includes Hematochezia)                           K44.9, Diaphragmatic hernia without obstruction or                            gangrene                           K29.70, Gastritis, unspecified, without bleeding CPT copyright 2016 American [employer] Association. All rights reserved. The codes documented in this report are preliminary and upon coder review may  be revised to meet current compliance requirements. Juanita Craver, MD Juanita Craver, MD 09/24/2015 1:43:49 PM This report has been signed electronically. Number of Addenda: 0

## 2015-09-25 ENCOUNTER — Inpatient Hospital Stay (HOSPITAL_COMMUNITY): Payer: 59

## 2015-09-25 DIAGNOSIS — D508 Other iron deficiency anemias: Secondary | ICD-10-CM

## 2015-09-25 DIAGNOSIS — K922 Gastrointestinal hemorrhage, unspecified: Secondary | ICD-10-CM

## 2015-09-25 LAB — TYPE AND SCREEN
ABO/RH(D): A POS
Antibody Screen: NEGATIVE
UNIT DIVISION: 0
Unit division: 0

## 2015-09-25 LAB — COMPREHENSIVE METABOLIC PANEL
ALT: 15 U/L (ref 14–54)
AST: 18 U/L (ref 15–41)
Albumin: 3.2 g/dL — ABNORMAL LOW (ref 3.5–5.0)
Alkaline Phosphatase: 65 U/L (ref 38–126)
Anion gap: 8 (ref 5–15)
BILIRUBIN TOTAL: 1.5 mg/dL — AB (ref 0.3–1.2)
CALCIUM: 8.9 mg/dL (ref 8.9–10.3)
CO2: 23 mmol/L (ref 22–32)
CREATININE: 0.44 mg/dL (ref 0.44–1.00)
Chloride: 108 mmol/L (ref 101–111)
GFR calc Af Amer: >60 mL/min (ref >60)
Glucose, Bld: 154 mg/dL — ABNORMAL HIGH (ref 65–99)
Potassium: 4 mmol/L (ref 3.5–5.1)
Sodium: 139 mmol/L (ref 135–145)
TOTAL PROTEIN: 6 g/dL — AB (ref 6.5–8.1)

## 2015-09-25 LAB — CBC
HCT: 31.8 % — ABNORMAL LOW (ref 36.0–46.0)
Hemoglobin: 9.3 g/dL — ABNORMAL LOW (ref 12.0–15.0)
MCH: 21.4 pg — ABNORMAL LOW (ref 26.0–34.0)
MCHC: 29.6 g/dL — ABNORMAL LOW (ref 30.0–36.0)
MCV: 72.3 fL — ABNORMAL LOW (ref 78.0–100.0)
Platelets: 359 10*3/uL (ref 150–400)
RBC: 4.4 MIL/uL (ref 3.87–5.11)
RDW: 23.3 % — ABNORMAL HIGH (ref 11.5–15.5)
WBC: 5.6 10*3/uL (ref 4.0–10.5)

## 2015-09-25 LAB — GLUCOSE, CAPILLARY
GLUCOSE-CAPILLARY: 184 mg/dL — AB (ref 65–99)
Glucose-Capillary: 176 mg/dL — ABNORMAL HIGH (ref 65–99)
Glucose-Capillary: 135 mg/dL — ABNORMAL HIGH (ref 65–99)

## 2015-09-25 LAB — CEA: CEA: 4.1 ng/mL (ref 0.0–4.7)

## 2015-09-25 MED ORDER — IOPAMIDOL (ISOVUE-300) INJECTION 61%
INTRAVENOUS | Status: AC
  Administered 2015-09-25: 75 mL via INTRAVENOUS
  Filled 2015-09-25: qty 75

## 2015-09-25 NOTE — Progress Notes (Signed)
Patient ID: Karen Good, female   DOB: June 17, 1955, 60 y.o.   MRN: [id number]     [facility]      [address] [place], [place] [address]    Phone: [phone] FAX: [phone]     Subjective: 1 unit PRBCs 6/11.  H&h stable today.  Although the patient reports BRBPR.  Tolerating fulls.   Objective:  Vital signs:  Filed Vitals:   09/24/15 2002 09/24/15 2037 09/25/15 0004 09/25/15 0528  BP: 138/70 135/63 149/65 143/66  Pulse: 101 90 80 87  Temp: 99.1 F (37.3 C) 98.9 F (37.2 C) 98.5 F (36.9 C) 98.4 F (36.9 C)  TempSrc: Oral Oral Oral   Resp: _0 Height:      Weight:      SpO2: 98% 98% 98% 100%    Last BM Date: 09/25/15  Intake/Output   Yesterday:  06/11 0701 - 06/12 0700 In: 1855 [P.O.:500; I.V.:950; Blood:355; IV Piggyback:50] Out: -  This shift:  Physical Exam: General: Pt awake/alert/oriented x4 in no acute distress  Abdomen: Soft.  Nondistended.  Non tender.  No evidence of peritonitis.  No incarcerated hernias.    Problem List:   Active Problems:   Lower GI bleed   Hematochezia   Absolute anemia    Results:   Labs: Results for orders placed or performed during the hospital encounter of 09/22/15 (from the past 48 hour(s))  Glucose, capillary     Status: Abnormal   Collection Time: 09/23/15 12:10 PM  Result Value Ref Range   Glucose-Capillary 161 (H) 65 - 99 mg/dL  Glucose, capillary     Status: Abnormal   Collection Time: 09/23/15  4:38 PM  Result Value Ref Range   Glucose-Capillary 128 (H) 65 - 99 mg/dL  Glucose, capillary     Status: Abnormal   Collection Time: 09/23/15  9:20 PM  Result Value Ref Range   Glucose-Capillary 125 (H) 65 - 99 mg/dL  CBC     Status: Abnormal   Collection Time: 09/24/15  5:26 AM  Result Value Ref Range   WBC 4.3 4.0 - 10.5 K/uL   RBC 3.58 (L) 3.87 - 5.11 MIL/uL   Hemoglobin 7.3 (L) 12.0 - 15.0 g/dL   HCT 25.1 (L) 36.0 - 46.0 %   MCV 69.0 (L)  78.0 - 100.0 fL   MCH 20.4 (L) 26.0 - 34.0 pg   MCHC 29.1 (L) 30.0 - 36.0 g/dL   RDW 23.0 (H) 11.5 - 15.5 %   Platelets 401 (H) 150 - 400 K/uL  Comprehensive metabolic panel     Status: Abnormal   Collection Time: 09/24/15  5:26 AM  Result Value Ref Range   Sodium 143 135 - 145 mmol/L   Potassium 3.7 3.5 - 5.1 mmol/L   Chloride 111 101 - 111 mmol/L   CO2 25 22 - 32 mmol/L   Glucose, Bld 128 (H) 65 - 99 mg/dL   BUN <5 (L) 6 - 20 mg/dL   Creatinine, Ser 0.54 0.44 - 1.00 mg/dL   Calcium 8.7 (L) 8.9 - 10.3 mg/dL   Total Protein 5.3 (L) 6.5 - 8.1 g/dL   Albumin 2.9 (L) 3.5 - 5.0 g/dL   AST 13 (L) 15 - 41 U/L   ALT 12 (L) 14 - 54 U/L   Alkaline Phosphatase 56 38 - 126 U/L   Total Bilirubin 0.7 0.3 - 1.2 mg/dL   GFR calc non Af Amer >60 >  60 mL/min   GFR calc Af Amer >60 >60 mL/min    Comment: (NOTE) The eGFR has been calculated using the CKD EPI equation. This calculation has not been validated in all clinical situations. eGFR's persistently <60 mL/min signify possible Chronic Kidney Disease.    Anion gap 7 5 - 15  Glucose, capillary     Status: Abnormal   Collection Time: 09/24/15  7:44 AM  Result Value Ref Range   Glucose-Capillary 145 (H) 65 - 99 mg/dL  Prepare RBC     Status: None   Collection Time: 09/24/15 11:24 AM  Result Value Ref Range   Order Confirmation ORDER PROCESSED BY BLOOD BANK   Glucose, capillary     Status: Abnormal   Collection Time: 09/24/15 12:05 PM  Result Value Ref Range   Glucose-Capillary 128 (H) 65 - 99 mg/dL  Glucose, capillary     Status: Abnormal   Collection Time: 09/24/15  5:07 PM  Result Value Ref Range   Glucose-Capillary 198 (H) 65 - 99 mg/dL  Glucose, capillary     Status: Abnormal   Collection Time: 09/24/15 10:33 PM  Result Value Ref Range   Glucose-Capillary 119 (H) 65 - 99 mg/dL  CBC     Status: Abnormal   Collection Time: 09/25/15  6:02 AM  Result Value Ref Range   WBC 5.6 4.0 - 10.5 K/uL   RBC 4.40 3.87 - 5.11 MIL/uL    Hemoglobin 9.3 (L) 12.0 - 15.0 g/dL    Comment: DELTA CHECK NOTED REPEATED TO VERIFY POST TRANSFUSION SPECIMEN    HCT 31.8 (L) 36.0 - 46.0 %   MCV 72.3 (L) 78.0 - 100.0 fL   MCH 21.4 (L) 26.0 - 34.0 pg   MCHC 29.6 (L) 30.0 - 36.0 g/dL   RDW 23.3 (H) 11.5 - 15.5 %   Platelets 359 150 - 400 K/uL  Comprehensive metabolic panel     Status: Abnormal   Collection Time: 09/25/15  6:02 AM  Result Value Ref Range   Sodium 139 135 - 145 mmol/L   Potassium 4.0 3.5 - 5.1 mmol/L   Chloride 108 101 - 111 mmol/L   CO2 23 22 - 32 mmol/L   Glucose, Bld 154 (H) 65 - 99 mg/dL   BUN <5 (L) 6 - 20 mg/dL   Creatinine, Ser 0.44 0.44 - 1.00 mg/dL   Calcium 8.9 8.9 - 10.3 mg/dL   Total Protein 6.0 (L) 6.5 - 8.1 g/dL   Albumin 3.2 (L) 3.5 - 5.0 g/dL   AST 18 15 - 41 U/L   ALT 15 14 - 54 U/L   Alkaline Phosphatase 65 38 - 126 U/L   Total Bilirubin 1.5 (H) 0.3 - 1.2 mg/dL   GFR calc non Af Amer >60 >60 mL/min   GFR calc Af Amer >60 >60 mL/min    Comment: (NOTE) The eGFR has been calculated using the CKD EPI equation. This calculation has not been validated in all clinical situations. eGFR's persistently <60 mL/min signify possible Chronic Kidney Disease.    Anion gap 8 5 - 15    Imaging / Studies: Ct Chest W Contrast  09/25/2015  CLINICAL DATA:  Pt admitted for hematochezia x 1 month, with recurrent red blood per rectum, without pain; pt found to have a rectal mass on colonoscopy Pt denies any chest pain, sob, cough,etc. EXAM: CT CHEST WITH CONTRAST TECHNIQUE: Multidetector CT imaging of the chest was performed during intravenous contrast administration. CONTRAST:  ISOVUE-300 IOPAMIDOL (ISOVUE-300) INJECTION 61%  COMPARISON:  09/22/2015 FINDINGS: Heart: Coronary artery calcifications are present. Heart size is normal. Trace pericardial effusion is noted. Vascular structures: There is atherosclerotic calcification of the thoracic aorta. Bovine arch anatomy. No aneurysm. Mediastinum/thyroid: The visualized  portion of the thyroid gland has a normal appearance. No mediastinal, hilar, or axillary adenopathy. Lungs/Airways: No pulmonary nodules, pleural effusions, or infiltrates. Upper abdomen: No focal abnormality identified within the visualized portions of the liver, spleen, adrenal glands. Chest wall/osseous structures: Irregular mass identified within the upper outer quadrant of the left breast. Further evaluation is indicated. Osseous structures are unremarkable. IMPRESSION: 1. Mass within the upper-outer quadrant of the left breast warranting further evaluation. Diagnostic mammography and left breast ultrasound are suggested. 2. No suspicious pulmonary abnormality. 3. Atherosclerosis of the coronary arteries and thoracic aorta. These results will be called to the ordering clinician or representative by the Radiologist Assistant, and communication documented in the PACS or zVision Dashboard. Electronically Signed   By: Nolon Nations M.D.   On: 09/25/2015 09:47    Medications / Allergies:  Scheduled Meds: . amLODipine  10 mg Oral Daily  . atenolol  12.5 mg Oral BID  . atorvastatin  20 mg Oral Daily  . famotidine (PEPCID) IV  20 mg Intravenous Q12H  . insulin aspart  0-9 Units Subcutaneous TID WC  . irbesartan  300 mg Oral Daily   Continuous Infusions: . 0.9 % sodium chloride with kcl Stopped (09/24/15 1458)   PRN Meds:.clonazePAM, HYDROcodone-acetaminophen  Antibiotics: Anti-infectives    None        Assessment/Plan Rectal mass/anemia-likely malignant, await pathology, CEA is pending.  CT chest negative for pulmonary metastasis.  CT of abdomen and pelvis without evidence of metastatic disease.  ?should we consult to oncology.  Left breast mass-had a mammogram 6/16.  Denies a family history of breast cancer.  She will need OP referral to the breast center for a diagnostic mammogram.  I discussed the results with the patient and her spouse.     Erby Pian, [facility]  Surgery Pager [phone]) For consults and floor pages call [phone]([id number])  09/25/2015 10:31 AM

## 2015-09-25 NOTE — Progress Notes (Signed)
Blood transfusion started by day shift RN at 2022. Night Shift RN took report on patient from day shift nurse and completed the blood transfusion at 2359.

## 2015-09-25 NOTE — Progress Notes (Addendum)
[facility] PROGRESS NOTE  Karen Good [id number] DOB: Nov 29, 1955 DOA: 09/22/2015   PCP: Cathlean Cower, MD     Assessment/Plan: Active Problems:   Lower GI bleed   Hematochezia   Absolute anemia   60 yo female with history of HTN,DMII who came with cc of hematochezia for almost a month with recurrent red blood per rectum without pain. No melena. No hematemesis or hematuria or hemoptysis.she went for her regular checkup and was found to be anemic with a hemoglobin of 6.9.She's noticed bright red blood per rectum with normally formed stool over the course of one month.she has never had a colonoscopy. Patient has ibuprofen which she takes for muscular skeletal pain, she takes this daily 3x 200 mg tabs in the a.m. and 3 in the PM. This is the only NSAID that she takes, she does not drink alcohol. No easy bruising/bleeding/night sweats/unintentional weight loss. CT abdomen pelvis shows fullness in the rectosigmoid junction concerning for polyp versus carcinoma. Dr. Collene Mares from gastroenterology has been consulted. Patient is status post receiving 1 unit of packed red blood cells  Assessment and plan Microcytic anemia/rectosigmoid mass Patient s/p  EGD and colonoscopy ,She underwent colonoscopy 6/11 reportedly showing a rectal mass. Dr. Collene Mares asked surgery to see her in consultation regarding surgical management Status post receiving IV iron, 2 units of packed red blood cells, hemoglobin 9.3 CEA pending, results of biopsy pending Continue full liquid diet CT chest negative for metastatic disease  Breast mass-patient found to have a mass in the upper outer quadrant of the left breast. Patient aware   Hypertension -stable, continue Norvasc , restart low-dose atenolol , holding HCTZ, Avapro,  Dyslipidemia-stable, continue Lipitor  Diabetes mellitus , holding glipizide, janumet,  , continue sliding scale insulin , hemoglobin A1c 5.9    DVT prophylaxsis SCDs  Code Status:  Full  code      Family Communication: Discussed in detail with the patient, all imaging results, lab results explained to the patient   Disposition Plan:  Anticipate discharge in 2-3 days     Consultants:  Gastroenterology  Procedures:  EGD/colonoscopy 6/11  Antibiotics: Anti-infectives    None         HPI/Subjective: Tolerating fulls.  denies any episodes of hematochezia overnight   Objective: Filed Vitals:   09/24/15 2002 09/24/15 2037 09/25/15 0004 09/25/15 0528  BP: 138/70 135/63 149/65 143/66  Pulse: 101 90 80 87  Temp: 99.1 F (37.3 C) 98.9 F (37.2 C) 98.5 F (36.9 C) 98.4 F (36.9 C)  TempSrc: Oral Oral Oral   Resp: 22 20 16 18   Height:      Weight:      SpO2: 98% 98% 98% 100%    Intake/Output Summary (Last 24 hours) at 09/25/15 1252 Last data filed at 09/25/15 0640  Gross per 24 hour  Intake   1355 ml  Output      0 ml  Net   1355 ml    Exam:  Examination:  General exam: Appears calm and comfortable  Respiratory system: Clear to auscultation. Respiratory effort normal. Cardiovascular system: S1 & S2 heard, RRR. No JVD, murmurs, rubs, gallops or clicks. No pedal edema. Gastrointestinal system: Abdomen is nondistended, soft and nontender. No organomegaly or masses felt. Normal bowel sounds heard. Central nervous system: Alert and oriented. No focal neurological deficits. Extremities: Symmetric 5 x 5 power. Skin: No rashes, lesions or ulcers Psychiatry: Judgement and insight appear normal. Mood & affect appropriate.  Data Reviewed: I have personally reviewed following labs and imaging studies  Micro Results No results found for this or any previous visit (from the past 240 hour(s)).  Radiology Reports Ct Chest W Contrast  09/25/2015  CLINICAL DATA:  Pt admitted for hematochezia x 1 month, with recurrent red blood per rectum, without pain; pt found to have a rectal mass on colonoscopy Pt denies any chest pain, sob, cough,etc. EXAM: CT  CHEST WITH CONTRAST TECHNIQUE: Multidetector CT imaging of the chest was performed during intravenous contrast administration. CONTRAST:  ISOVUE-300 IOPAMIDOL (ISOVUE-300) INJECTION 61% COMPARISON:  09/22/2015 FINDINGS: Heart: Coronary artery calcifications are present. Heart size is normal. Trace pericardial effusion is noted. Vascular structures: There is atherosclerotic calcification of the thoracic aorta. Bovine arch anatomy. No aneurysm. Mediastinum/thyroid: The visualized portion of the thyroid gland has a normal appearance. No mediastinal, hilar, or axillary adenopathy. Lungs/Airways: No pulmonary nodules, pleural effusions, or infiltrates. Upper abdomen: No focal abnormality identified within the visualized portions of the liver, spleen, adrenal glands. Chest wall/osseous structures: Irregular mass identified within the upper outer quadrant of the left breast. Further evaluation is indicated. Osseous structures are unremarkable. IMPRESSION: 1. Mass within the upper-outer quadrant of the left breast warranting further evaluation. Diagnostic mammography and left breast ultrasound are suggested. 2. No suspicious pulmonary abnormality. 3. Atherosclerosis of the coronary arteries and thoracic aorta. These results will be called to the ordering clinician or representative by the Radiologist Assistant, and communication documented in the PACS or zVision Dashboard. Electronically Signed   By: Nolon Nations M.D.   On: 09/25/2015 09:47   Ct Abdomen Pelvis W Contrast  09/22/2015  CLINICAL DATA:  Hematochezia.  Hypertension and diabetes. EXAM: CT ABDOMEN AND PELVIS WITH CONTRAST TECHNIQUE: Multidetector CT imaging of the abdomen and pelvis was performed using the standard protocol following bolus administration of intravenous contrast. CONTRAST:  1 ISOVUE-300 IOPAMIDOL (ISOVUE-300) INJECTION 61% COMPARISON:  None. FINDINGS: Lower chest: Clear lung bases. Normal heart size without pericardial or pleural effusion.  Hepatobiliary: Variant lateral segment left liver lobe extending in the left upper quadrant. Normal gallbladder, without biliary ductal dilatation. Pancreas: Normal, without mass or ductal dilatation. Spleen: Normal in size, without focal abnormality. Adrenals/Urinary Tract: Normal adrenal glands. Normal kidneys, without hydronephrosis. Normal urinary bladder. Stomach/Bowel: Proximal gastric underdistention. Remainder of the stomach is unremarkable. Equivocal soft tissue fullness at the rectosigmoid junction, including on images 15- 63/series 201 and coronal image 69. The remainder of the colon and terminal ileum are unremarkable. Appendix is likely identified and normal on image 50/ series 201. Normal small bowel. Vascular/Lymphatic: Aortic and branch vessel atherosclerosis. No abdominopelvic adenopathy. Reproductive: Uterine calcifications likely relate to underlying fibroids. Fundal lesion posteriorly at approximately 3.4 cm. No adnexal mass. Other: No significant free fluid. No evidence of omental or peritoneal disease. Mild abdominal wall laxity without well-defined hernia. Musculoskeletal: No acute osseous abnormality. Mild disc bulges at L3-4 and L4-5. IMPRESSION: 1. Soft tissue fullness at the rectosigmoid junction. Although this could be related to underdistention and stool, polyp or carcinoma cannot be excluded in this patient with hematochezia. 2. Otherwise, no acute process in the abdomen or pelvis. 3. Uterine fibroids. Electronically Signed   By: Abigail Miyamoto M.D.   On: 09/22/2015 20:53     CBC  Recent Labs Lab 09/22/15 1142 09/22/15 1635 09/23/15 0658 09/24/15 0526 09/25/15 0602  WBC 5.1 8.4  --  4.3 5.6  HGB 6.9 cL* 7.2* 8.1* 7.3* 9.3*  HCT 22.8 Repeated and verified X2.* 25.6* 27.4* 25.1*  31.8*  PLT 479.0* 546*  --  401* 359  MCV 63.8 Repeated and verified X2.* 65.0*  --  69.0* 72.3*  MCH  --  18.3*  --  20.4* 21.4*  MCHC 30.3 28.1*  --  29.1* 29.6*  RDW 23.3* 21.2*  --  23.0*  23.3*  LYMPHSABS 1.2  --   --   --   --   MONOABS 0.4  --   --   --   --   EOSABS 0.1  --   --   --   --   BASOSABS 0.0  --   --   --   --     Chemistries   Recent Labs Lab 09/22/15 1142 09/22/15 1635 09/22/15 2310 09/24/15 0526 09/25/15 0602  NA 140 139  --  143 139  K 3.5 3.0*  --  3.7 4.0  CL 103 103  --  111 108  CO2 29 22  --  25 23  GLUCOSE 132* 168*  --  128* 154*  BUN 16 15  --  <5* <5*  CREATININE 0.57 0.68  --  0.54 0.44  CALCIUM 9.6 9.8  --  8.7* 8.9  MG  --   --  1.6*  --   --   AST 12 17  --  13* 18  ALT 10 16  --  12* 15  ALKPHOS 65 70  --  56 65  BILITOT 0.5 0.5  --  0.7 1.5*   ------------------------------------------------------------------------------------------------------------------ estimated creatinine clearance is 73.3 mL/min (by C-G formula based on Cr of 0.44). ------------------------------------------------------------------------------------------------------------------ No results for input(s): HGBA1C in the last 72 hours. ------------------------------------------------------------------------------------------------------------------ No results for input(s): CHOL, HDL, LDLCALC, TRIG, CHOLHDL, LDLDIRECT in the last 72 hours. ------------------------------------------------------------------------------------------------------------------ No results for input(s): TSH, T4TOTAL, T3FREE, THYROIDAB in the last 72 hours.  Invalid input(s): FREET3 ------------------------------------------------------------------------------------------------------------------  Recent Labs  09/22/15 1920 09/22/15 1921  VITAMINB12 252  --   FOLATE  --  19.3  FERRITIN 5*  --   TIBC 671*  --   IRON 11*  --   RETICCTPCT 1.3  --     Coagulation profile No results for input(s): INR, PROTIME in the last 168 hours.  No results for input(s): DDIMER in the last 72 hours.  Cardiac Enzymes No results for input(s): CKMB, TROPONINI, MYOGLOBIN in the last 168  hours.  Invalid input(s): CK ------------------------------------------------------------------------------------------------------------------ Invalid input(s): POCBNP   CBG:  Recent Labs Lab 09/24/15 1205 09/24/15 1707 09/24/15 2233 09/25/15 0735 09/25/15 1219  GLUCAP 128* 198* 119* 184* 176*       Studies: Ct Chest W Contrast  09/25/2015  CLINICAL DATA:  Pt admitted for hematochezia x 1 month, with recurrent red blood per rectum, without pain; pt found to have a rectal mass on colonoscopy Pt denies any chest pain, sob, cough,etc. EXAM: CT CHEST WITH CONTRAST TECHNIQUE: Multidetector CT imaging of the chest was performed during intravenous contrast administration. CONTRAST:  ISOVUE-300 IOPAMIDOL (ISOVUE-300) INJECTION 61% COMPARISON:  09/22/2015 FINDINGS: Heart: Coronary artery calcifications are present. Heart size is normal. Trace pericardial effusion is noted. Vascular structures: There is atherosclerotic calcification of the thoracic aorta. Bovine arch anatomy. No aneurysm. Mediastinum/thyroid: The visualized portion of the thyroid gland has a normal appearance. No mediastinal, hilar, or axillary adenopathy. Lungs/Airways: No pulmonary nodules, pleural effusions, or infiltrates. Upper abdomen: No focal abnormality identified within the visualized portions of the liver, spleen, adrenal glands. Chest wall/osseous structures: Irregular mass identified within the upper outer quadrant of the left  breast. Further evaluation is indicated. Osseous structures are unremarkable. IMPRESSION: 1. Mass within the upper-outer quadrant of the left breast warranting further evaluation. Diagnostic mammography and left breast ultrasound are suggested. 2. No suspicious pulmonary abnormality. 3. Atherosclerosis of the coronary arteries and thoracic aorta. These results will be called to the ordering clinician or representative by the Radiologist Assistant, and communication documented in the PACS or zVision  Dashboard. Electronically Signed   By: Nolon Nations M.D.   On: 09/25/2015 09:47      Lab Results  Component Value Date   HGBA1C 5.9 09/22/2015   HGBA1C 10.0* 07/11/2014   HGBA1C 10.3* 01/26/2013   Lab Results  Component Value Date   MICROALBUR 8.3* 09/22/2015   LDLCALC 93 09/22/2015   CREATININE 0.44 09/25/2015       Scheduled Meds: . amLODipine  10 mg Oral Daily  . atenolol  12.5 mg Oral BID  . atorvastatin  20 mg Oral Daily  . famotidine (PEPCID) IV  20 mg Intravenous Q12H  . insulin aspart  0-9 Units Subcutaneous TID WC  . irbesartan  300 mg Oral Daily   Continuous Infusions: . 0.9 % sodium chloride with kcl Stopped (09/24/15 1458)     LOS: 3 days    Time spent: >30 MINS    [facility]  [facility] Pager [phone]. If 7PM-7AM, please contact night-coverage at [email or web address], password [facility] 09/25/2015, 12:52 PM  LOS: 3 days

## 2015-09-26 ENCOUNTER — Encounter (HOSPITAL_COMMUNITY): Payer: Self-pay | Admitting: Gastroenterology

## 2015-09-26 ENCOUNTER — Telehealth: Payer: Self-pay | Admitting: *Deleted

## 2015-09-26 DIAGNOSIS — D509 Iron deficiency anemia, unspecified: Secondary | ICD-10-CM

## 2015-09-26 LAB — CBC
HCT: 31.5 % — ABNORMAL LOW (ref 36.0–46.0)
Hemoglobin: 9.2 g/dL — ABNORMAL LOW (ref 12.0–15.0)
MCH: 20.8 pg — ABNORMAL LOW (ref 26.0–34.0)
MCHC: 29.2 g/dL — ABNORMAL LOW (ref 30.0–36.0)
MCV: 71.1 fL — ABNORMAL LOW (ref 78.0–100.0)
PLATELETS: 381 10*3/uL (ref 150–400)
RBC: 4.43 MIL/uL (ref 3.87–5.11)
RDW: 23.3 % — AB (ref 11.5–15.5)
WBC: 5.4 10*3/uL (ref 4.0–10.5)

## 2015-09-26 LAB — BASIC METABOLIC PANEL
ANION GAP: 6 (ref 5–15)
CALCIUM: 8.9 mg/dL (ref 8.9–10.3)
CO2: 25 mmol/L (ref 22–32)
CREATININE: 0.48 mg/dL (ref 0.44–1.00)
Chloride: 110 mmol/L (ref 101–111)
GFR calc non Af Amer: >60 mL/min (ref >60)
GLUCOSE: 153 mg/dL — AB (ref 65–99)
Potassium: 4.5 mmol/L (ref 3.5–5.1)
SODIUM: 141 mmol/L (ref 135–145)

## 2015-09-26 LAB — GLUCOSE, CAPILLARY
Glucose-Capillary: 156 mg/dL — ABNORMAL HIGH (ref 65–99)
Glucose-Capillary: 198 mg/dL — ABNORMAL HIGH (ref 65–99)

## 2015-09-26 MED ORDER — POTASSIUM CHLORIDE CRYS ER 20 MEQ PO TBCR: 40.0000 meq | EXTENDED_RELEASE_TABLET | Freq: Every day | ORAL | Status: DC

## 2015-09-26 MED ORDER — ATENOLOL 25 MG PO TABS: 12.5000 mg | ORAL_TABLET | Freq: Two times a day (BID) | ORAL | Status: DC

## 2015-09-26 MED ORDER — CLONAZEPAM 0.5 MG PO TABS: 0.5000 mg | ORAL_TABLET | Freq: Two times a day (BID) | ORAL | Status: DC | PRN

## 2015-09-26 MED ORDER — GLIPIZIDE 5 MG PO TABS: 2.5000 mg | ORAL_TABLET | Freq: Two times a day (BID) | ORAL | Status: DC

## 2015-09-26 MED ORDER — HYDROCODONE-ACETAMINOPHEN 5-325 MG PO TABS: 1.0000 | ORAL_TABLET | Freq: Four times a day (QID) | ORAL | Status: DC | PRN

## 2015-09-26 NOTE — Progress Notes (Signed)
[facility] PROGRESS NOTE  Karen Good [id number] DOB: Jul 01, 1955 DOA: 09/22/2015   PCP: Cathlean Cower, MD     Assessment/Plan: Active Problems:   Lower GI bleed   Hematochezia   Absolute anemia   60 yo female with history of HTN,DMII who came with cc of hematochezia for almost a month with recurrent red blood per rectum without pain. No melena. No hematemesis or hematuria or hemoptysis.she went for her regular checkup and was found to be anemic with a hemoglobin of 6.9.She's noticed bright red blood per rectum with normally formed stool over the course of one month.she has never had a colonoscopy. Patient has ibuprofen which she takes for muscular skeletal pain, she takes this daily 3x 200 mg tabs in the a.m. and 3 in the PM. This is the only NSAID that she takes, she does not drink alcohol. No easy bruising/bleeding/night sweats/unintentional weight loss. CT abdomen pelvis shows fullness in the rectosigmoid junction concerning for polyp versus carcinoma. Dr. Collene Mares from gastroenterology has been consulted. Patient is status post receiving 1 unit of packed red blood cells  Assessment and plan Microcytic anemia/rectosigmoid mass Patient s/p  EGD and colonoscopy ,She underwent colonoscopy 6/11 reportedly showing a rectal mass. Dr. Collene Mares asked surgery to see her in consultation regarding surgical management Status post receiving IV iron, 2 units of packed red blood cells, hemoglobin 9.3 CEA 4.1 , results of biopsy confirmed adenocarcinoma Patient advance to soft diet CT chest negative for metastatic disease but shows a left breast mass Patient to be seen by Dr. Burr Medico in two weeks, radiation oncology Dr. Lisbeth Renshaw  Breast mass-patient found to have a mass in the upper outer quadrant of the left breast. Patient aware   Hypertension -stable, continue Norvasc , restart low-dose atenolol , holding HCTZ, Avapro,  Dyslipidemia-stable, continue Lipitor  Diabetes mellitus , holding    janumet,  , continue sliding scale insulin , hemoglobin A1c 5.9. Restart glipizide    DVT prophylaxsis SCDs  Code Status:  Full code      Family Communication: Discussed in detail with the patient, all imaging results, lab results explained to the patient   Disposition Plan:  Anticipate discharge tomorrow     Consultants:  Gastroenterology  Procedures:  EGD/colonoscopy 6/11  Antibiotics: Anti-infectives    None         HPI/Subjective: Tolerating fulls. Denies any nausea vomiting abdominal pain, advance diet as tolerated   Objective: Filed Vitals:   09/25/15 0528 09/25/15 1359 09/25/15 2149 09/26/15 0540  BP: 143/66 127/68 146/61 145/70  Pulse: 87 96 71 83  Temp: 98.4 F (36.9 C) 98.1 F (36.7 C) 98.1 F (36.7 C) 98.1 F (36.7 C)  TempSrc:   Oral   Resp: 18 18 16 16   Height:      Weight:      SpO2: 100% 100% 97% 97%    Intake/Output Summary (Last 24 hours) at 09/26/15 1307 Last data filed at 09/26/15 0931  Gross per 24 hour  Intake    480 ml  Output      0 ml  Net    480 ml    Exam:  Examination:  General exam: Appears calm and comfortable  Respiratory system: Clear to auscultation. Respiratory effort normal. Cardiovascular system: S1 & S2 heard, RRR. No JVD, murmurs, rubs, gallops or clicks. No pedal edema. Gastrointestinal system: Abdomen is nondistended, soft and nontender. No organomegaly or masses felt. Normal bowel sounds heard. Central nervous system: Alert  and oriented. No focal neurological deficits. Extremities: Symmetric 5 x 5 power. Skin: No rashes, lesions or ulcers Psychiatry: Judgement and insight appear normal. Mood & affect appropriate.     Data Reviewed: I have personally reviewed following labs and imaging studies  Micro Results No results found for this or any previous visit (from the past 240 hour(s)).  Radiology Reports Ct Chest W Contrast  09/25/2015  CLINICAL DATA:  Pt admitted for hematochezia x 1 month, with  recurrent red blood per rectum, without pain; pt found to have a rectal mass on colonoscopy Pt denies any chest pain, sob, cough,etc. EXAM: CT CHEST WITH CONTRAST TECHNIQUE: Multidetector CT imaging of the chest was performed during intravenous contrast administration. CONTRAST:  ISOVUE-300 IOPAMIDOL (ISOVUE-300) INJECTION 61% COMPARISON:  09/22/2015 FINDINGS: Heart: Coronary artery calcifications are present. Heart size is normal. Trace pericardial effusion is noted. Vascular structures: There is atherosclerotic calcification of the thoracic aorta. Bovine arch anatomy. No aneurysm. Mediastinum/thyroid: The visualized portion of the thyroid gland has a normal appearance. No mediastinal, hilar, or axillary adenopathy. Lungs/Airways: No pulmonary nodules, pleural effusions, or infiltrates. Upper abdomen: No focal abnormality identified within the visualized portions of the liver, spleen, adrenal glands. Chest wall/osseous structures: Irregular mass identified within the upper outer quadrant of the left breast. Further evaluation is indicated. Osseous structures are unremarkable. IMPRESSION: 1. Mass within the upper-outer quadrant of the left breast warranting further evaluation. Diagnostic mammography and left breast ultrasound are suggested. 2. No suspicious pulmonary abnormality. 3. Atherosclerosis of the coronary arteries and thoracic aorta. These results will be called to the ordering clinician or representative by the Radiologist Assistant, and communication documented in the PACS or zVision Dashboard. Electronically Signed   By: Nolon Nations M.D.   On: 09/25/2015 09:47   Ct Abdomen Pelvis W Contrast  09/22/2015  CLINICAL DATA:  Hematochezia.  Hypertension and diabetes. EXAM: CT ABDOMEN AND PELVIS WITH CONTRAST TECHNIQUE: Multidetector CT imaging of the abdomen and pelvis was performed using the standard protocol following bolus administration of intravenous contrast. CONTRAST:  1 ISOVUE-300 IOPAMIDOL  (ISOVUE-300) INJECTION 61% COMPARISON:  None. FINDINGS: Lower chest: Clear lung bases. Normal heart size without pericardial or pleural effusion. Hepatobiliary: Variant lateral segment left liver lobe extending in the left upper quadrant. Normal gallbladder, without biliary ductal dilatation. Pancreas: Normal, without mass or ductal dilatation. Spleen: Normal in size, without focal abnormality. Adrenals/Urinary Tract: Normal adrenal glands. Normal kidneys, without hydronephrosis. Normal urinary bladder. Stomach/Bowel: Proximal gastric underdistention. Remainder of the stomach is unremarkable. Equivocal soft tissue fullness at the rectosigmoid junction, including on images 15- 63/series 201 and coronal image 69. The remainder of the colon and terminal ileum are unremarkable. Appendix is likely identified and normal on image 50/ series 201. Normal small bowel. Vascular/Lymphatic: Aortic and branch vessel atherosclerosis. No abdominopelvic adenopathy. Reproductive: Uterine calcifications likely relate to underlying fibroids. Fundal lesion posteriorly at approximately 3.4 cm. No adnexal mass. Other: No significant free fluid. No evidence of omental or peritoneal disease. Mild abdominal wall laxity without well-defined hernia. Musculoskeletal: No acute osseous abnormality. Mild disc bulges at L3-4 and L4-5. IMPRESSION: 1. Soft tissue fullness at the rectosigmoid junction. Although this could be related to underdistention and stool, polyp or carcinoma cannot be excluded in this patient with hematochezia. 2. Otherwise, no acute process in the abdomen or pelvis. 3. Uterine fibroids. Electronically Signed   By: Abigail Miyamoto M.D.   On: 09/22/2015 20:53     CBC  Recent Labs Lab 09/22/15 1142 09/22/15 1635 09/23/15  [id number] 09/24/15 0526 09/25/15 0602 09/26/15 0522  WBC 5.1 8.4  --  4.3 5.6 5.4  HGB 6.9 cL* 7.2* 8.1* 7.3* 9.3* 9.2*  HCT 22.8 Repeated and verified X2.* 25.6* 27.4* 25.1* 31.8* 31.5*  PLT 479.0* 546*   --  401* 359 381  MCV 63.8 Repeated and verified X2.* 65.0*  --  69.0* 72.3* 71.1*  MCH  --  18.3*  --  20.4* 21.4* 20.8*  MCHC 30.3 28.1*  --  29.1* 29.6* 29.2*  RDW 23.3* 21.2*  --  23.0* 23.3* 23.3*  LYMPHSABS 1.2  --   --   --   --   --   MONOABS 0.4  --   --   --   --   --   EOSABS 0.1  --   --   --   --   --   BASOSABS 0.0  --   --   --   --   --     Chemistries   Recent Labs Lab 09/22/15 1142 09/22/15 1635 09/22/15 2310 09/24/15 0526 09/25/15 0602 09/26/15 0522  NA 140 139  --  143 139 141  K 3.5 3.0*  --  3.7 4.0 4.5  CL 103 103  --  111 108 110  CO2 29 22  --  25 23 25   GLUCOSE 132* 168*  --  128* 154* 153*  BUN 16 15  --  <5* <5* <5*  CREATININE 0.57 0.68  --  0.54 0.44 0.48  CALCIUM 9.6 9.8  --  8.7* 8.9 8.9  MG  --   --  1.6*  --   --   --   AST 12 17  --  13* 18  --   ALT 10 16  --  12* 15  --   ALKPHOS 65 70  --  56 65  --   BILITOT 0.5 0.5  --  0.7 1.5*  --    ------------------------------------------------------------------------------------------------------------------ estimated creatinine clearance is 73.3 mL/min (by C-G formula based on Cr of 0.48). ------------------------------------------------------------------------------------------------------------------ No results for input(s): HGBA1C in the last 72 hours. ------------------------------------------------------------------------------------------------------------------ No results for input(s): CHOL, HDL, LDLCALC, TRIG, CHOLHDL, LDLDIRECT in the last 72 hours. ------------------------------------------------------------------------------------------------------------------ No results for input(s): TSH, T4TOTAL, T3FREE, THYROIDAB in the last 72 hours.  Invalid input(s): FREET3 ------------------------------------------------------------------------------------------------------------------ No results for input(s): VITAMINB12, FOLATE, FERRITIN, TIBC, IRON, RETICCTPCT in the last 72  hours.  Coagulation profile No results for input(s): INR, PROTIME in the last 168 hours.  No results for input(s): DDIMER in the last 72 hours.  Cardiac Enzymes No results for input(s): CKMB, TROPONINI, MYOGLOBIN in the last 168 hours.  Invalid input(s): CK ------------------------------------------------------------------------------------------------------------------ Invalid input(s): POCBNP   CBG:  Recent Labs Lab 09/25/15 0735 09/25/15 1219 09/25/15 2146 09/26/15 0821 09/26/15 1130  GLUCAP 184* 176* 135* 156* 198*       Studies: Ct Chest W Contrast  09/25/2015  CLINICAL DATA:  Pt admitted for hematochezia x 1 month, with recurrent red blood per rectum, without pain; pt found to have a rectal mass on colonoscopy Pt denies any chest pain, sob, cough,etc. EXAM: CT CHEST WITH CONTRAST TECHNIQUE: Multidetector CT imaging of the chest was performed during intravenous contrast administration. CONTRAST:  ISOVUE-300 IOPAMIDOL (ISOVUE-300) INJECTION 61% COMPARISON:  09/22/2015 FINDINGS: Heart: Coronary artery calcifications are present. Heart size is normal. Trace pericardial effusion is noted. Vascular structures: There is atherosclerotic calcification of the thoracic aorta. Bovine arch anatomy. No aneurysm. Mediastinum/thyroid: The visualized portion of the thyroid gland  has a normal appearance. No mediastinal, hilar, or axillary adenopathy. Lungs/Airways: No pulmonary nodules, pleural effusions, or infiltrates. Upper abdomen: No focal abnormality identified within the visualized portions of the liver, spleen, adrenal glands. Chest wall/osseous structures: Irregular mass identified within the upper outer quadrant of the left breast. Further evaluation is indicated. Osseous structures are unremarkable. IMPRESSION: 1. Mass within the upper-outer quadrant of the left breast warranting further evaluation. Diagnostic mammography and left breast ultrasound are suggested. 2. No suspicious  pulmonary abnormality. 3. Atherosclerosis of the coronary arteries and thoracic aorta. These results will be called to the ordering clinician or representative by the Radiologist Assistant, and communication documented in the PACS or zVision Dashboard. Electronically Signed   By: Nolon Nations M.D.   On: 09/25/2015 09:47      Lab Results  Component Value Date   HGBA1C 5.9 09/22/2015   HGBA1C 10.0* 07/11/2014   HGBA1C 10.3* 01/26/2013   Lab Results  Component Value Date   MICROALBUR 8.3* 09/22/2015   LDLCALC 93 09/22/2015   CREATININE 0.48 09/26/2015       Scheduled Meds: . amLODipine  10 mg Oral Daily  . atenolol  12.5 mg Oral BID  . atorvastatin  20 mg Oral Daily  . famotidine (PEPCID) IV  20 mg Intravenous Q12H  . insulin aspart  0-9 Units Subcutaneous TID WC  . irbesartan  300 mg Oral Daily   Continuous Infusions:     LOS: 4 days    Time spent: >30 MINS    [facility]  [facility] Pager [phone]. If 7PM-7AM, please contact night-coverage at [email or web address], password [facility] 09/26/2015, 1:07 PM  LOS: 4 days

## 2015-09-26 NOTE — Care Management Note (Signed)
Case Management Note  Patient Details  Name: Karen Good MRN: [id number] Date of Birth: [birth year]  Subjective/Objective:                 Independent patient from home admitted with lower GI Bleed.    Action/Plan:  Will DC to home, self care.   Expected Discharge Date:                  Expected Discharge Plan:  Home/Self Care  In-House Referral:     Discharge planning Services  CM Consult  Post Acute Care Choice:  NA Choice offered to:     DME Arranged:    DME Agency:     HH Arranged:    [place] Agency:     Status of Service:  Completed, signed off  Medicare Important Message Given:    Date Medicare IM Given:    Medicare IM give by:    Date Additional Medicare IM Given:    Additional Medicare Important Message give by:     If discussed at [place] of Stay Meetings, dates discussed:    Additional Comments:  Carles Collet, RN 09/26/2015, 2:09 PM

## 2015-09-26 NOTE — Progress Notes (Signed)
NURSING PROGRESS NOTE  Karen Good [id number] Discharge Data: 09/26/2015 4:00 PM Attending Provider: Reyne Dumas, MD [id number] Karen Reichmann, MD     Karen Good to be D/C'd Home per MD order.  Discussed with the patient the After Visit Summary and all questions fully answered. All IV's discontinued with no bleeding noted. All belongings returned to patient for patient to take home.   Last Vital Signs:  Blood pressure 145/70, pulse 83, temperature 98.1 F (36.7 C), temperature source Oral, resp. rate 16, height 5\' 5"  (1.651 m), weight 71.668 kg (158 lb), SpO2 97 %.  Discharge Medication List   Medication List    STOP taking these medications        aspirin 81 MG tablet     hydrochlorothiazide 25 MG tablet  Commonly known as:  HYDRODIURIL     ibuprofen 200 MG tablet  Commonly known as:  ADVIL,MOTRIN     pioglitazone 45 MG tablet  Commonly known as:  ACTOS      TAKE these medications        amLODipine 10 MG tablet  Commonly known as:  NORVASC  Take 1 tablet (10 mg total) by mouth daily.     atenolol 25 MG tablet  Commonly known as:  TENORMIN  Take 0.5 tablets (12.5 mg total) by mouth 2 (two) times daily.     atorvastatin 20 MG tablet  Commonly known as:  LIPITOR  Take 1 tablet (20 mg total) by mouth daily.     clonazePAM 0.5 MG tablet  Commonly known as:  KLONOPIN  Take 1 tablet (0.5 mg total) by mouth 2 (two) times daily as needed (anxiety).     GLIPIZIDE XL 5 MG 24 hr tablet  Generic drug:  glipiZIDE  TAKE TWO TABLETS BY MOUTH DAILY     HYDROcodone-acetaminophen 5-325 MG tablet  Commonly known as:  NORCO  Take 1 tablet by mouth every 6 (six) hours as needed for moderate pain.     irbesartan 300 MG tablet  Commonly known as:  AVAPRO  Take 1 tablet (300 mg total) by mouth at bedtime.     KLOR-CON M10 10 MEQ tablet  Generic drug:  potassium chloride  TAKE 1 TABLET DAILY     sitaGLIPtin-metformin 50-1000 MG tablet  Commonly known as:  JANUMET  Take 1  tablet by mouth 2 (two) times daily.

## 2015-09-26 NOTE — Telephone Encounter (Signed)
Oncology Nurse Navigator Documentation  Oncology Nurse Navigator Flowsheets 09/26/2015  Navigator Location [facility]  Navigator Encounter Type Introductory phone call  Abnormal Finding Date 09/24/2015  Confirmed Diagnosis Date 09/24/2015   Spoke with patient and provided new patient appointment for 10/10/15 at 2:15/2:30 pm with Dr. [name] Merle. Informed of location of [place], valet service, and registration process. Reminded to bring insurance cards and a current medication list, including supplements. Patient verbalizes understanding. Message to HIM to enter appointment into EPIC and message to radiation oncology for new patient appointment with Dr. Sheralyn Boatman day if possible.

## 2015-09-26 NOTE — Progress Notes (Signed)
Patient ID: Karen Good, female   DOB: 11-19-1955, 60 y.o.   MRN: [id number]     [address] [place], [place] [address]    Phone: [phone] FAX: [phone]     Subjective: Small amt of brbprn this am with bm. Tolerating fulls. H&h stable when compared to yesterday.   Objective:  Vital signs:  Filed Vitals:   09/25/15 0528 09/25/15 1359 09/25/15 2149 09/26/15 0540  BP: 143/66 127/68 146/61 145/70  Pulse: 87 96 71 83  Temp: 98.4 F (36.9 C) 98.1 F (36.7 C) 98.1 F (36.7 C) 98.1 F (36.7 C)  TempSrc:   Oral   Resp: '18 18 16 16  '$ Height:      Weight:      SpO2: 100% 100% 97% 97%    Last BM Date: 09/25/15  Intake/Output   Yesterday:    This shift:  Total I/O In: 480 [P.O.:480] Out: -   Physical Exam: General: Pt awake/alert/oriented x4 in no acute distress  Abdomen: Soft.  Nondistended. Non tender.  No evidence of peritonitis.  No incarcerated hernias.    Problem List:   Active Problems:   Lower GI bleed   Hematochezia   Absolute anemia    Results:   Labs: Results for orders placed or performed during the hospital encounter of 09/22/15 (from the past 48 hour(s))  Prepare RBC     Status: None   Collection Time: 09/24/15 11:24 AM  Result Value Ref Range   Order Confirmation ORDER PROCESSED BY BLOOD BANK   Glucose, capillary     Status: Abnormal   Collection Time: 09/24/15 12:05 PM  Result Value Ref Range   Glucose-Capillary 128 (H) 65 - 99 mg/dL  CEA     Status: None   Collection Time: 09/24/15  2:49 PM  Result Value Ref Range   CEA 4.1 0.0 - 4.7 ng/mL    Comment: (NOTE)       Roche ECLIA methodology       Nonsmokers  <3.9                                     Smokers     <5.6 Performed At: [facility] [place], [place] [id number] Lindon Romp MD [id number]   Glucose, capillary     Status: Abnormal   Collection Time: 09/24/15  5:07 PM   Result Value Ref Range   Glucose-Capillary 198 (H) 65 - 99 mg/dL  Glucose, capillary     Status: Abnormal   Collection Time: 09/24/15 10:33 PM  Result Value Ref Range   Glucose-Capillary 119 (H) 65 - 99 mg/dL  CBC     Status: Abnormal   Collection Time: 09/25/15  6:02 AM  Result Value Ref Range   WBC 5.6 4.0 - 10.5 K/uL   RBC 4.40 3.87 - 5.11 MIL/uL   Hemoglobin 9.3 (L) 12.0 - 15.0 g/dL    Comment: DELTA CHECK NOTED REPEATED TO VERIFY POST TRANSFUSION SPECIMEN    HCT 31.8 (L) 36.0 - 46.0 %   MCV 72.3 (L) 78.0 - 100.0 fL   MCH 21.4 (L) 26.0 - 34.0 pg   MCHC 29.6 (L) 30.0 - 36.0 g/dL   RDW 23.3 (H) 11.5 - 15.5 %   Platelets 359 150 - 400 K/uL  Comprehensive metabolic panel  Status: Abnormal   Collection Time: 09/25/15  6:02 AM  Result Value Ref Range   Sodium 139 135 - 145 mmol/L   Potassium 4.0 3.5 - 5.1 mmol/L   Chloride 108 101 - 111 mmol/L   CO2 23 22 - 32 mmol/L   Glucose, Bld 154 (H) 65 - 99 mg/dL   BUN <5 (L) 6 - 20 mg/dL   Creatinine, Ser 0.44 0.44 - 1.00 mg/dL   Calcium 8.9 8.9 - 10.3 mg/dL   Total Protein 6.0 (L) 6.5 - 8.1 g/dL   Albumin 3.2 (L) 3.5 - 5.0 g/dL   AST 18 15 - 41 U/L   ALT 15 14 - 54 U/L   Alkaline Phosphatase 65 38 - 126 U/L   Total Bilirubin 1.5 (H) 0.3 - 1.2 mg/dL   GFR calc non Af Amer >60 >60 mL/min   GFR calc Af Amer >60 >60 mL/min    Comment: (NOTE) The eGFR has been calculated using the CKD EPI equation. This calculation has not been validated in all clinical situations. eGFR's persistently <60 mL/min signify possible Chronic Kidney Disease.    Anion gap 8 5 - 15  Glucose, capillary     Status: Abnormal   Collection Time: 09/25/15  7:35 AM  Result Value Ref Range   Glucose-Capillary 184 (H) 65 - 99 mg/dL   Comment 1 QC Due   Glucose, capillary     Status: Abnormal   Collection Time: 09/25/15 12:19 PM  Result Value Ref Range   Glucose-Capillary 176 (H) 65 - 99 mg/dL  Glucose, capillary     Status: Abnormal   Collection Time:  09/25/15  9:46 PM  Result Value Ref Range   Glucose-Capillary 135 (H) 65 - 99 mg/dL   Comment 1 Notify RN    Comment 2 Document in Chart   CBC     Status: Abnormal   Collection Time: 09/26/15  5:22 AM  Result Value Ref Range   WBC 5.4 4.0 - 10.5 K/uL   RBC 4.43 3.87 - 5.11 MIL/uL   Hemoglobin 9.2 (L) 12.0 - 15.0 g/dL   HCT 31.5 (L) 36.0 - 46.0 %   MCV 71.1 (L) 78.0 - 100.0 fL   MCH 20.8 (L) 26.0 - 34.0 pg   MCHC 29.2 (L) 30.0 - 36.0 g/dL   RDW 23.3 (H) 11.5 - 15.5 %   Platelets 381 150 - 400 K/uL  Basic metabolic panel     Status: Abnormal   Collection Time: 09/26/15  5:22 AM  Result Value Ref Range   Sodium 141 135 - 145 mmol/L   Potassium 4.5 3.5 - 5.1 mmol/L   Chloride 110 101 - 111 mmol/L   CO2 25 22 - 32 mmol/L   Glucose, Bld 153 (H) 65 - 99 mg/dL   BUN <5 (L) 6 - 20 mg/dL   Creatinine, Ser 0.48 0.44 - 1.00 mg/dL   Calcium 8.9 8.9 - 10.3 mg/dL   GFR calc non Af Amer >60 >60 mL/min   GFR calc Af Amer >60 >60 mL/min    Comment: (NOTE) The eGFR has been calculated using the CKD EPI equation. This calculation has not been validated in all clinical situations. eGFR's persistently <60 mL/min signify possible Chronic Kidney Disease.    Anion gap 6 5 - 15  Glucose, capillary     Status: Abnormal   Collection Time: 09/26/15  8:21 AM  Result Value Ref Range   Glucose-Capillary 156 (H) 65 - 99 mg/dL  Imaging / Studies: Ct Chest W Contrast  09/25/2015  CLINICAL DATA:  Pt admitted for hematochezia x 1 month, with recurrent red blood per rectum, without pain; pt found to have a rectal mass on colonoscopy Pt denies any chest pain, sob, cough,etc. EXAM: CT CHEST WITH CONTRAST TECHNIQUE: Multidetector CT imaging of the chest was performed during intravenous contrast administration. CONTRAST:  ISOVUE-300 IOPAMIDOL (ISOVUE-300) INJECTION 61% COMPARISON:  09/22/2015 FINDINGS: Heart: Coronary artery calcifications are present. Heart size is normal. Trace pericardial effusion is noted.  Vascular structures: There is atherosclerotic calcification of the thoracic aorta. Bovine arch anatomy. No aneurysm. Mediastinum/thyroid: The visualized portion of the thyroid gland has a normal appearance. No mediastinal, hilar, or axillary adenopathy. Lungs/Airways: No pulmonary nodules, pleural effusions, or infiltrates. Upper abdomen: No focal abnormality identified within the visualized portions of the liver, spleen, adrenal glands. Chest wall/osseous structures: Irregular mass identified within the upper outer quadrant of the left breast. Further evaluation is indicated. Osseous structures are unremarkable. IMPRESSION: 1. Mass within the upper-outer quadrant of the left breast warranting further evaluation. Diagnostic mammography and left breast ultrasound are suggested. 2. No suspicious pulmonary abnormality. 3. Atherosclerosis of the coronary arteries and thoracic aorta. These results will be called to the ordering clinician or representative by the Radiologist Assistant, and communication documented in the PACS or zVision Dashboard. Electronically Signed   By: Nolon Nations M.D.   On: 09/25/2015 09:47    Medications / Allergies:  Scheduled Meds: . amLODipine  10 mg Oral Daily  . atenolol  12.5 mg Oral BID  . atorvastatin  20 mg Oral Daily  . famotidine (PEPCID) IV  20 mg Intravenous Q12H  . insulin aspart  0-9 Units Subcutaneous TID WC  . irbesartan  300 mg Oral Daily   Continuous Infusions: . 0.9 % sodium chloride with kcl 100 mL/hr at 09/26/15 0010   PRN Meds:.clonazePAM, HYDROcodone-acetaminophen  Antibiotics: Anti-infectives    None       Assessment/Plan Anemia-h&h stable, although reports BRBPR this am, small amouints.  Rectal cancer, adenocarcinoma-I have reached out to the cancer center for OP follow up in the next week.  She will follow up with Dr. Leighton Ruff.  CEA 4.1 Advance diet CBC in AM Left breast mass-refer for a diagnostic mammogram as an  outpatient   Erby Pian, [facility] Pager [phone](7A-4:30P) For consults and floor pages call [phone](7A-4:30P)  09/26/2015 10:16 AM

## 2015-09-29 ENCOUNTER — Encounter: Payer: Self-pay | Admitting: Gastroenterology

## 2015-10-02 ENCOUNTER — Telehealth: Payer: Self-pay | Admitting: Gastroenterology

## 2015-10-02 NOTE — Progress Notes (Signed)
GI Location of Tumor / Histology: Colon   [name] Schommer presented  months ago with symptoms of: rectal bleeding   Biopsies of  (if applicable) revealed: Diagnosis 09/24/2015 : Colon, biopsy, Rectal sigmoid mass=- ADENOCARCINOMA. .  Past/Anticipated interventions by [facility], if any: EGD and Colonoscopy 09/24/15 with bx, Dr. Owens Loffler, 10/05/15=EUS  Past/Anticipated interventions by medical oncology, if any: Dr. Burr Medico appt 10/10/15   Weight changes, if any:   Bowel/Bladder complaints, if any: intermittent constipation Nausea / Vomiting, if any:  Pain issues, if any: chronic back pain  Any blood per rectum: yes  SAFETY ISSUES:  Prior radiation? NO  Pacemaker/ICD? NO  Possible current pregnancy? NO  Is the patient on methotrexate? NO  Current Complaints/Details:Married, non smoker,no alcohol or illicit drug use

## 2015-10-04 ENCOUNTER — Other Ambulatory Visit: Payer: Self-pay

## 2015-10-04 ENCOUNTER — Other Ambulatory Visit: Payer: Self-pay | Admitting: General Surgery

## 2015-10-04 ENCOUNTER — Encounter: Payer: Self-pay | Admitting: *Deleted

## 2015-10-04 ENCOUNTER — Telehealth: Payer: Self-pay

## 2015-10-04 DIAGNOSIS — N63 Unspecified lump in unspecified breast: Secondary | ICD-10-CM

## 2015-10-04 DIAGNOSIS — C20 Malignant neoplasm of rectum: Secondary | ICD-10-CM

## 2015-10-04 NOTE — Telephone Encounter (Signed)
EUS scheduled, pt instructed and medications reviewed.  Patient instructions mailed to home.  Patient to call with any questions or concerns.  

## 2015-10-05 ENCOUNTER — Encounter (HOSPITAL_COMMUNITY): Payer: Self-pay | Admitting: *Deleted

## 2015-10-05 ENCOUNTER — Ambulatory Visit (HOSPITAL_COMMUNITY)
Admission: RE | Admit: 2015-10-05 | Discharge: 2015-10-05 | Disposition: A | Discharge status: Home / Self Care | Payer: 59 | Source: Ambulatory Visit | Attending: Gastroenterology | Admitting: Gastroenterology

## 2015-10-05 ENCOUNTER — Encounter (HOSPITAL_COMMUNITY)
Admission: RE | Disposition: A | Discharge status: Home / Self Care | Payer: Self-pay | Source: Ambulatory Visit | Attending: Gastroenterology

## 2015-10-05 DIAGNOSIS — N63 Unspecified lump in breast: Secondary | ICD-10-CM | POA: Insufficient documentation

## 2015-10-05 DIAGNOSIS — D375 Neoplasm of uncertain behavior of rectum: Secondary | ICD-10-CM

## 2015-10-05 DIAGNOSIS — D649 Anemia, unspecified: Secondary | ICD-10-CM | POA: Insufficient documentation

## 2015-10-05 DIAGNOSIS — Z79899 Other long term (current) drug therapy: Secondary | ICD-10-CM | POA: Insufficient documentation

## 2015-10-05 DIAGNOSIS — C19 Malignant neoplasm of rectosigmoid junction: Secondary | ICD-10-CM | POA: Diagnosis not present

## 2015-10-05 DIAGNOSIS — C20 Malignant neoplasm of rectum: Secondary | ICD-10-CM

## 2015-10-05 HISTORY — PX: EUS: SHX5427

## 2015-10-05 LAB — GLUCOSE, CAPILLARY: GLUCOSE-CAPILLARY: 165 mg/dL — AB (ref 65–99)

## 2015-10-05 SURGERY — ULTRASOUND, LOWER GI TRACT, ENDOSCOPIC
Anesthesia: Moderate Sedation

## 2015-10-05 MED ORDER — SODIUM CHLORIDE 0.9 % IV SOLN
INTRAVENOUS | Status: DC
  Administered 2015-10-05: 11:00:00 via INTRAVENOUS

## 2015-10-05 MED ORDER — MIDAZOLAM HCL 5 MG/ML IJ SOLN
INTRAMUSCULAR | Status: AC
  Filled 2015-10-05: qty 2

## 2015-10-05 MED ORDER — DIPHENHYDRAMINE HCL 50 MG/ML IJ SOLN
INTRAMUSCULAR | Status: AC
Start: 2015-10-05 — End: 2015-10-05
  Filled 2015-10-05: qty 1

## 2015-10-05 MED ORDER — SPOT INK MARKER SYRINGE KIT
PACK | SUBMUCOSAL | Status: DC | PRN
  Administered 2015-10-05: 5 mL via SUBMUCOSAL

## 2015-10-05 MED ORDER — MIDAZOLAM HCL 10 MG/2ML IJ SOLN
INTRAMUSCULAR | Status: DC | PRN
  Administered 2015-10-05: 1 mg via INTRAVENOUS
  Administered 2015-10-05 (×2): 2 mg via INTRAVENOUS

## 2015-10-05 MED ORDER — FENTANYL CITRATE (PF) 100 MCG/2ML IJ SOLN
INTRAMUSCULAR | Status: DC | PRN
  Administered 2015-10-05 (×2): 25 ug via INTRAVENOUS

## 2015-10-05 MED ORDER — FENTANYL CITRATE (PF) 100 MCG/2ML IJ SOLN
INTRAMUSCULAR | Status: AC
  Filled 2015-10-05: qty 2

## 2015-10-05 NOTE — Op Note (Signed)
[facility] Patient Name: Karen Good Procedure Date: 10/05/2015 MRN: [id number] Attending MD: Milus Banister , MD Date of Birth: [birth year] CSN: [id number] Age: 60 Admit Type: Outpatient Procedure:                Lower EUS Indications:              Pre-treatment staging for rectal adenocarcinoma, no                            sign of metastatic disease by recent CT scan chest,                            abd, pelvis; recent colonoscopy by Dr. Collene Mares Providers:                Milus Banister, MD, Cleda Daub, RN, Zenon Mayo, RN, Cherylynn Ridges, Technician Referring MD:             Leighton Ruff, MD Medicines:                Fentanyl 50 micrograms IV, Midazolam 5 mg IV Complications:            No immediate complications. Estimated blood loss:                            None. Estimated Blood Loss:     Estimated blood loss: none. Procedure:                Pre-Anesthesia Assessment:                           - Prior to the procedure, a History and Physical                            was performed, and patient medications and                            allergies were reviewed. The patient's tolerance of                            previous anesthesia was also reviewed. The risks                            and benefits of the procedure and the sedation                            options and risks were discussed with the patient.                            All questions were answered, and informed consent                            was obtained. Prior Anticoagulants: The patient has  taken no previous anticoagulant or antiplatelet                            agents. ASA Grade Assessment: II - A patient with                            mild systemic disease. After reviewing the risks                            and benefits, the patient was deemed in                            satisfactory condition to undergo the  procedure.                           After obtaining informed consent, the endoscope was                            passed under direct vision. Throughout the                            procedure, the patient's blood pressure, pulse, and                            oxygen saturations were monitored continuously. The                            [id number]) scope was introduced through                            the anus and advanced to the the sigmoid colon for                            ultrasound. The lower EUS was accomplished without                            difficulty. The patient tolerated the procedure                            well. The quality of the bowel preparation was good. Scope In: 12:15:52 PM Scope Out: 12:20:05 PM Total Procedure Duration: 0 hours 4 minutes 13 seconds  Findings:      Endoscopic Finding :      1. A non-obstructing medium-sized mass was found in the recto-sigmoid       colon. The mass was partially circumferential. The mass measured six cm       in length and its distal edge was 9cm from the anal verge. No bleeding       was present.      2. The proximal and distal edges of the mass were labeled with       submucosal injection of [place] Ink.      Endosonographic Finding :      1. The mass above correlated with a partially circumferential (involving       90% of the lumen) hypoechoic mass.  The mass measured 60 mm (in maximum       length) by 20 mm (in maximum thickness). There was sonographic evidence       suggesting breakthrough of the muscularis propria with invasion into the       perirectal fat (Layer 5, manifested by prominent pseudopodia).      2. No perirectal adenopathy. Impression:               Partially circumferential, 6cm long uT3N0                            rectosigmoid adenocarcinoma with distal edge                            located 9cm from the anal verge.                           Submucosal injections of [place] Ink, at  proximal and                            distal edges of the mass. Moderate Sedation:      Moderate (conscious) sedation was administered by the endoscopy nurse       and supervised by the endoscopist. The following parameters were       monitored: oxygen saturation, heart rate, blood pressure, and response       to care. Total physician intraservice time was 20 minutes. Recommendation:           - Discharge patient to home (ambulatory). Procedure Code(s):        --- Professional ---                           [phone], Sigmoidoscopy, flexible; with endoscopic                            ultrasound examination                           [id number], Sigmoidoscopy, flexible; with directed                            submucosal injection(s), any substance Diagnosis Code(s):        --- Professional ---                           C19, Malignant neoplasm of rectosigmoid junction                           C20, Malignant neoplasm of rectum                           C21.8, Malignant neoplasm of overlapping sites of                            rectum, anus and anal canal CPT copyright 2016 [employer]. All rights reserved. The codes documented in this report are preliminary and upon coder review may  be revised to meet current compliance requirements. Milus Banister,  MD 10/05/2015 12:34:29 PM This report has been signed electronically. Number of Addenda: 0

## 2015-10-05 NOTE — Discharge Instructions (Signed)

## 2015-10-05 NOTE — H&P (View-Only) (Signed)
Patient ID: Karen Good, female   DOB: 1955/12/29, 60 y.o.   MRN: [id number]     [address] [place], [place] [address]    Phone: [phone] FAX: [phone]     Subjective: Small amt of brbprn this am with bm. Tolerating fulls. H&h stable when compared to yesterday.   Objective:  Vital signs:  Filed Vitals:   09/25/15 0528 09/25/15 1359 09/25/15 2149 09/26/15 0540  BP: 143/66 127/68 146/61 145/70  Pulse: 87 96 71 83  Temp: 98.4 F (36.9 C) 98.1 F (36.7 C) 98.1 F (36.7 C) 98.1 F (36.7 C)  TempSrc:   Oral   Resp: '18 18 16 16  '$ Height:      Weight:      SpO2: 100% 100% 97% 97%    Last BM Date: 09/25/15  Intake/Output   Yesterday:    This shift:  Total I/O In: 480 [P.O.:480] Out: -   Physical Exam: General: Pt awake/alert/oriented x4 in no acute distress  Abdomen: Soft.  Nondistended. Non tender.  No evidence of peritonitis.  No incarcerated hernias.    Problem List:   Active Problems:   Lower GI bleed   Hematochezia   Absolute anemia    Results:   Labs: Results for orders placed or performed during the hospital encounter of 09/22/15 (from the past 48 hour(s))  Prepare RBC     Status: None   Collection Time: 09/24/15 11:24 AM  Result Value Ref Range   Order Confirmation ORDER PROCESSED BY BLOOD BANK   Glucose, capillary     Status: Abnormal   Collection Time: 09/24/15 12:05 PM  Result Value Ref Range   Glucose-Capillary 128 (H) 65 - 99 mg/dL  CEA     Status: None   Collection Time: 09/24/15  2:49 PM  Result Value Ref Range   CEA 4.1 0.0 - 4.7 ng/mL    Comment: (NOTE)       Roche ECLIA methodology       Nonsmokers  <3.9                                     Smokers     <5.6 Performed At: [facility] [place], [place] [id number] Lindon Romp MD [id number]   Glucose, capillary     Status: Abnormal   Collection Time: 09/24/15  5:07 PM   Result Value Ref Range   Glucose-Capillary 198 (H) 65 - 99 mg/dL  Glucose, capillary     Status: Abnormal   Collection Time: 09/24/15 10:33 PM  Result Value Ref Range   Glucose-Capillary 119 (H) 65 - 99 mg/dL  CBC     Status: Abnormal   Collection Time: 09/25/15  6:02 AM  Result Value Ref Range   WBC 5.6 4.0 - 10.5 K/uL   RBC 4.40 3.87 - 5.11 MIL/uL   Hemoglobin 9.3 (L) 12.0 - 15.0 g/dL    Comment: DELTA CHECK NOTED REPEATED TO VERIFY POST TRANSFUSION SPECIMEN    HCT 31.8 (L) 36.0 - 46.0 %   MCV 72.3 (L) 78.0 - 100.0 fL   MCH 21.4 (L) 26.0 - 34.0 pg   MCHC 29.6 (L) 30.0 - 36.0 g/dL   RDW 23.3 (H) 11.5 - 15.5 %   Platelets 359 150 - 400 K/uL  Comprehensive metabolic panel  Status: Abnormal   Collection Time: 09/25/15  6:02 AM  Result Value Ref Range   Sodium 139 135 - 145 mmol/L   Potassium 4.0 3.5 - 5.1 mmol/L   Chloride 108 101 - 111 mmol/L   CO2 23 22 - 32 mmol/L   Glucose, Bld 154 (H) 65 - 99 mg/dL   BUN <5 (L) 6 - 20 mg/dL   Creatinine, Ser 0.44 0.44 - 1.00 mg/dL   Calcium 8.9 8.9 - 10.3 mg/dL   Total Protein 6.0 (L) 6.5 - 8.1 g/dL   Albumin 3.2 (L) 3.5 - 5.0 g/dL   AST 18 15 - 41 U/L   ALT 15 14 - 54 U/L   Alkaline Phosphatase 65 38 - 126 U/L   Total Bilirubin 1.5 (H) 0.3 - 1.2 mg/dL   GFR calc non Af Amer >60 >60 mL/min   GFR calc Af Amer >60 >60 mL/min    Comment: (NOTE) The eGFR has been calculated using the CKD EPI equation. This calculation has not been validated in all clinical situations. eGFR's persistently <60 mL/min signify possible Chronic Kidney Disease.    Anion gap 8 5 - 15  Glucose, capillary     Status: Abnormal   Collection Time: 09/25/15  7:35 AM  Result Value Ref Range   Glucose-Capillary 184 (H) 65 - 99 mg/dL   Comment 1 QC Due   Glucose, capillary     Status: Abnormal   Collection Time: 09/25/15 12:19 PM  Result Value Ref Range   Glucose-Capillary 176 (H) 65 - 99 mg/dL  Glucose, capillary     Status: Abnormal   Collection Time:  09/25/15  9:46 PM  Result Value Ref Range   Glucose-Capillary 135 (H) 65 - 99 mg/dL   Comment 1 Notify RN    Comment 2 Document in Chart   CBC     Status: Abnormal   Collection Time: 09/26/15  5:22 AM  Result Value Ref Range   WBC 5.4 4.0 - 10.5 K/uL   RBC 4.43 3.87 - 5.11 MIL/uL   Hemoglobin 9.2 (L) 12.0 - 15.0 g/dL   HCT 31.5 (L) 36.0 - 46.0 %   MCV 71.1 (L) 78.0 - 100.0 fL   MCH 20.8 (L) 26.0 - 34.0 pg   MCHC 29.2 (L) 30.0 - 36.0 g/dL   RDW 23.3 (H) 11.5 - 15.5 %   Platelets 381 150 - 400 K/uL  Basic metabolic panel     Status: Abnormal   Collection Time: 09/26/15  5:22 AM  Result Value Ref Range   Sodium 141 135 - 145 mmol/L   Potassium 4.5 3.5 - 5.1 mmol/L   Chloride 110 101 - 111 mmol/L   CO2 25 22 - 32 mmol/L   Glucose, Bld 153 (H) 65 - 99 mg/dL   BUN <5 (L) 6 - 20 mg/dL   Creatinine, Ser 0.48 0.44 - 1.00 mg/dL   Calcium 8.9 8.9 - 10.3 mg/dL   GFR calc non Af Amer >60 >60 mL/min   GFR calc Af Amer >60 >60 mL/min    Comment: (NOTE) The eGFR has been calculated using the CKD EPI equation. This calculation has not been validated in all clinical situations. eGFR's persistently <60 mL/min signify possible Chronic Kidney Disease.    Anion gap 6 5 - 15  Glucose, capillary     Status: Abnormal   Collection Time: 09/26/15  8:21 AM  Result Value Ref Range   Glucose-Capillary 156 (H) 65 - 99 mg/dL  Imaging / Studies: Ct Chest W Contrast  09/25/2015  CLINICAL DATA:  Pt admitted for hematochezia x 1 month, with recurrent red blood per rectum, without pain; pt found to have a rectal mass on colonoscopy Pt denies any chest pain, sob, cough,etc. EXAM: CT CHEST WITH CONTRAST TECHNIQUE: Multidetector CT imaging of the chest was performed during intravenous contrast administration. CONTRAST:  ISOVUE-300 IOPAMIDOL (ISOVUE-300) INJECTION 61% COMPARISON:  09/22/2015 FINDINGS: Heart: Coronary artery calcifications are present. Heart size is normal. Trace pericardial effusion is noted.  Vascular structures: There is atherosclerotic calcification of the thoracic aorta. Bovine arch anatomy. No aneurysm. Mediastinum/thyroid: The visualized portion of the thyroid gland has a normal appearance. No mediastinal, hilar, or axillary adenopathy. Lungs/Airways: No pulmonary nodules, pleural effusions, or infiltrates. Upper abdomen: No focal abnormality identified within the visualized portions of the liver, spleen, adrenal glands. Chest wall/osseous structures: Irregular mass identified within the upper outer quadrant of the left breast. Further evaluation is indicated. Osseous structures are unremarkable. IMPRESSION: 1. Mass within the upper-outer quadrant of the left breast warranting further evaluation. Diagnostic mammography and left breast ultrasound are suggested. 2. No suspicious pulmonary abnormality. 3. Atherosclerosis of the coronary arteries and thoracic aorta. These results will be called to the ordering clinician or representative by the Radiologist Assistant, and communication documented in the PACS or zVision Dashboard. Electronically Signed   By: Nolon Nations M.D.   On: 09/25/2015 09:47    Medications / Allergies:  Scheduled Meds: . amLODipine  10 mg Oral Daily  . atenolol  12.5 mg Oral BID  . atorvastatin  20 mg Oral Daily  . famotidine (PEPCID) IV  20 mg Intravenous Q12H  . insulin aspart  0-9 Units Subcutaneous TID WC  . irbesartan  300 mg Oral Daily   Continuous Infusions: . 0.9 % sodium chloride with kcl 100 mL/hr at 09/26/15 0010   PRN Meds:.clonazePAM, HYDROcodone-acetaminophen  Antibiotics: Anti-infectives    None       Assessment/Plan Anemia-h&h stable, although reports BRBPR this am, small amouints.  Rectal cancer, adenocarcinoma-I have reached out to the cancer center for OP follow up in the next week.  She will follow up with Dr. Leighton Ruff.  CEA 4.1 Advance diet CBC in AM Left breast mass-refer for a diagnostic mammogram as an  outpatient   Erby Pian, [facility] Pager [phone](7A-4:30P) For consults and floor pages call [phone](7A-4:30P)  09/26/2015 10:16 AM

## 2015-10-05 NOTE — Interval H&P Note (Signed)
History and Physical Interval Note:  10/05/2015 11:39 AM  Karen Good  has presented today for surgery, with the diagnosis of rectal cancer  The various methods of treatment have been discussed with the patient and family. After consideration of risks, benefits and other options for treatment, the patient has consented to  Procedure(s): LOWER ENDOSCOPIC ULTRASOUND (EUS) (N/A) as a surgical intervention .  The patient's history has been reviewed, patient examined, no change in status, stable for surgery.  I have reviewed the patient's chart and labs.  Questions were answered to the patient's satisfaction.     Milus Banister

## 2015-10-10 ENCOUNTER — Telehealth: Payer: Self-pay | Admitting: Hematology

## 2015-10-10 ENCOUNTER — Ambulatory Visit (HOSPITAL_BASED_OUTPATIENT_CLINIC_OR_DEPARTMENT_OTHER): Payer: 59 | Admitting: Hematology

## 2015-10-10 ENCOUNTER — Encounter: Payer: Self-pay | Admitting: Hematology

## 2015-10-10 VITALS — BP 154/70 | HR 115 | Temp 98.7°F | Resp 18 | Ht 65.0 in | Wt 152.6 lb

## 2015-10-10 DIAGNOSIS — I10 Essential (primary) hypertension: Secondary | ICD-10-CM | POA: Diagnosis not present

## 2015-10-10 DIAGNOSIS — D500 Iron deficiency anemia secondary to blood loss (chronic): Secondary | ICD-10-CM | POA: Diagnosis not present

## 2015-10-10 DIAGNOSIS — E119 Type 2 diabetes mellitus without complications: Secondary | ICD-10-CM | POA: Diagnosis not present

## 2015-10-10 DIAGNOSIS — C20 Malignant neoplasm of rectum: Secondary | ICD-10-CM

## 2015-10-10 DIAGNOSIS — D509 Iron deficiency anemia, unspecified: Secondary | ICD-10-CM

## 2015-10-10 DIAGNOSIS — F419 Anxiety disorder, unspecified: Secondary | ICD-10-CM

## 2015-10-10 MED ORDER — CAPECITABINE 500 MG PO TABS: 825.0000 mg/m2 | ORAL_TABLET | Freq: Two times a day (BID) | ORAL | Status: DC

## 2015-10-10 NOTE — Telephone Encounter (Signed)
per po to sch pt appt-gave pt copy of avs °

## 2015-10-10 NOTE — Telephone Encounter (Signed)
per pof to sch pt appt-gave pt copy of avs °

## 2015-10-10 NOTE — Progress Notes (Signed)
[facility]  [phone] [phone]  Clinic New Consult Note   Patient Care Team: Biagio Borg, MD as PCP - General 10/10/2015  Referring physician: Dr. Ardis Hughs  CHIEF COMPLAINTS/PURPOSE OF CONSULTATION:  Newly diagnosed rectal cancer  HISTORY OF PRESENTING ILLNESS:  Karen Good 60 y.o. female is here because of her newly diagnosed rectal cancer. She presents to my clinic with her husband.  She had had intermittent bloody stool for one month, somewhat mount, mixed with stool, with worsening fatigue, and 6 lbs weight loss, no significant abdominal pain, nausea, dyspnea, or other symptoms. She was seen by her PCP Dr. Jenny Reichmann and lab work showed anemia. She was sent to ED on 6/9 and received blood transfusion for Hb 6.9. She underwent a colonoscopy which showed a partially obstructive proximal rectal mass, biopsy showed adenocarcinoma. She was subsequently referred to Dr. Ardis Hughs and underwent EUS which showed a T3 N0 rectal mass. She was seen by [facility] Dr. Marcello Moores last week, and is also scheduled to see radiation oncologist Dr. Lisbeth Renshaw tomorrow.  She has felt much better overall after blood transfusion. She did receive IV ferric gluconate 125 mg in the hospital, but is not on oral iron supplements. She feels well overall, has good appetite and energy level, but does feel anxious since her cancer diagnosis.  MEDICAL HISTORY:  Past Medical History  Diagnosis Date  . BACK PAIN 04/13/2010  . BACK STRAIN, LUMBAR 04/13/2010  . DIABETES MELLITUS, TYPE II 06/02/2008  . HYPERLIPIDEMIA 06/02/2008  . HYPERTENSION 06/02/2008    SURGICAL HISTORY: Past Surgical History  Procedure Laterality Date  . S/p breast biopsy  4/07    benign  . Colonoscopy Left 09/24/2015    Procedure: COLONOSCOPY;  Dishon: Juanita Craver, MD;  Location: [facility] ENDOSCOPY;  Service: Endoscopy;  Laterality: Left;  . Esophagogastroduodenoscopy N/A 09/24/2015    Procedure: ESOPHAGOGASTRODUODENOSCOPY  (EGD);  Waites: Juanita Craver, MD;  Location: [facility] ENDOSCOPY;  Service: Endoscopy;  Laterality: N/A;  . Eus N/A 10/05/2015    Procedure: LOWER ENDOSCOPIC ULTRASOUND (EUS);  Dreese: Milus Banister, MD;  Location: [facility] ENDOSCOPY;  Service: Endoscopy;  Laterality: N/A;    SOCIAL HISTORY: Social History   Social History  . Marital Status: Single    Spouse Name: N/A  . Number of Children: 2  . Years of Education: N/A   Occupational History  .  [employer]   Social History Main Topics  . Smoking status: Never Smoker   . Smokeless tobacco: Not on file  . Alcohol Use: No  . Drug Use: No  . Sexual Activity: Yes    Birth Control/ Protection: Post-menopausal   Other Topics Concern  . Not on file   Social History Narrative    FAMILY HISTORY: Family History  Problem Relation Age of Onset  . Diabetes Mother   . Heart disease Father   . Diabetes Father   . Hypertension Father   . Diabetes Other     ALLERGIES:  has No Known Allergies.  MEDICATIONS:  Current Outpatient Prescriptions  Medication Sig Dispense Refill  . amLODipine (NORVASC) 10 MG tablet Take 1 tablet (10 mg total) by mouth daily. 30 tablet 10  . atenolol (TENORMIN) 25 MG tablet Take 0.5 tablets (12.5 mg total) by mouth 2 (two) times daily. 60 tablet 0  . atorvastatin (LIPITOR) 20 MG tablet Take 1 tablet (20 mg total) by mouth daily. 90 tablet 3  . clonazePAM (KLONOPIN) 0.5 MG tablet Take 1 tablet (0.5  mg total) by mouth 2 (two) times daily as needed (anxiety). 30 tablet 0  . GLIPIZIDE XL 5 MG 24 hr tablet TAKE TWO TABLETS BY MOUTH DAILY 180 tablet 2  . HYDROcodone-acetaminophen (NORCO) 5-325 MG tablet Take 1 tablet by mouth every 6 (six) hours as needed for moderate pain. 20 tablet 0  . irbesartan (AVAPRO) 300 MG tablet Take 1 tablet (300 mg total) by mouth at bedtime. 90 tablet 3  . KLOR-CON M10 10 MEQ tablet TAKE 1 TABLET DAILY (Patient taking differently: Take 1 tablet by mouth daily) 90 tablet 0  .  sitaGLIPtin-metformin (JANUMET) 50-1000 MG per tablet Take 1 tablet by mouth 2 (two) times daily. 180 tablet 3  . [DISCONTINUED] nebivolol (BYSTOLIC) 10 MG tablet Take 1 tablet (10 mg total) by mouth daily. 90 tablet 3  . [DISCONTINUED] valsartan (DIOVAN) 320 MG tablet Take 1 tablet (320 mg total) by mouth daily. 90 tablet 3   No current facility-administered medications for this visit.    REVIEW OF SYSTEMS:   Constitutional: Denies fevers, chills or abnormal night sweats Eyes: Denies blurriness of vision, double vision or watery eyes Ears, nose, mouth, throat, and face: Denies mucositis or sore throat Respiratory: Denies cough, dyspnea or wheezes Cardiovascular: Denies palpitation, chest discomfort or lower extremity swelling Gastrointestinal:  Denies nausea, heartburn or change in bowel habits Skin: Denies abnormal skin rashes Lymphatics: Denies new lymphadenopathy or easy bruising Neurological:Denies numbness, tingling or new weaknesses Behavioral/Psych: Mood is stable, no new changes  All other systems were reviewed with the patient and are negative.  PHYSICAL EXAMINATION: ECOG PERFORMANCE STATUS: 0 - Asymptomatic  Filed Vitals:   10/10/15 1452  BP: 154/70  Pulse: 115  Temp: 98.7 F (37.1 C)  Resp: 18   Filed Weights   10/10/15 1452  Weight: 152 lb 9.6 oz (69.219 kg)    GENERAL:alert, no distress and comfortable SKIN: skin color, texture, turgor are normal, no rashes or significant lesions EYES: normal, conjunctiva are pink and non-injected, sclera clear OROPHARYNX:no exudate, no erythema and lips, buccal mucosa, and tongue normal  NECK: supple, thyroid normal size, non-tender, without nodularity LYMPH:  no palpable lymphadenopathy in the cervical, axillary or inguinal LUNGS: clear to auscultation and percussion with normal breathing effort HEART: regular rate & rhythm, 2/6 systolic murmurs, no lower extremity edema ABDOMEN:abdomen soft, non-tender and normal bowel  sounds, no organomegaly. Rectal exam was not performed. Musculoskeletal:no cyanosis of digits and no clubbing  PSYCH: alert & oriented x 3 with fluent speech NEURO: no focal motor/sensory deficits  LABORATORY DATA:  I have reviewed the data as listed CBC Latest Ref Rng 09/26/2015 09/25/2015 09/24/2015  WBC 4.0 - 10.5 K/uL 5.4 5.6 4.3  Hemoglobin 12.0 - 15.0 g/dL 9.2(L) 9.3(L) 7.3(L)  Hematocrit 36.0 - 46.0 % 31.5(L) 31.8(L) 25.1(L)  Platelets 150 - 400 K/uL 381 359 401(H)   CMP Latest Ref Rng 09/26/2015 09/25/2015 09/24/2015  Glucose 65 - 99 mg/dL 153(H) 154(H) 128(H)  BUN 6 - 20 mg/dL <5(L) <5(L) <5(L)  Creatinine 0.44 - 1.00 mg/dL 0.48 0.44 0.54  Sodium 135 - 145 mmol/L 141 139 143  Potassium 3.5 - 5.1 mmol/L 4.5 4.0 3.7  Chloride 101 - 111 mmol/L 110 108 111  CO2 22 - 32 mmol/L 25 23 25   Calcium 8.9 - 10.3 mg/dL 8.9 8.9 8.7(L)  Total Protein 6.5 - 8.1 g/dL - 6.0(L) 5.3(L)  Total Bilirubin 0.3 - 1.2 mg/dL - 1.5(H) 0.7  Alkaline Phos 38 - 126 U/L - 65 56  AST 15 - 41 U/L - 18 13(L)  ALT 14 - 54 U/L - 15 12(L)   CEA  Status: Finalresult Visible to patient:  Not Released Nextappt: 10/11/2015 at 03:00 PM in Radiation Oncology ([place])          Ref Range 2wk ago    CEA 0.0 - 4.7 ng/mL 4.1         PATHOLOGY REPORT  Diagnosis 09/24/2015 Colon, biopsy, Rectal sigmoid mass - ADENOCARCINOMA. - SEE COMMENT. Microscopic Comment Dr. Orene Desanctis has reviewed the case and concurs with this interpretation. Dr. Collene Mares was paged on 09/26/15. (JBK:gt, 09/26/15)   RADIOGRAPHIC STUDIES: I have personally reviewed the radiological images as listed and agreed with the findings in the report. Ct Chest W Contrast  09/25/2015  CLINICAL DATA:  Pt admitted for hematochezia x 1 month, with recurrent red blood per rectum, without pain; pt found to have a rectal mass on colonoscopy Pt denies any chest pain, sob, cough,etc. EXAM: CT CHEST WITH CONTRAST TECHNIQUE: Multidetector CT imaging of the  chest was performed during intravenous contrast administration. CONTRAST:  ISOVUE-300 IOPAMIDOL (ISOVUE-300) INJECTION 61% COMPARISON:  09/22/2015 FINDINGS: Heart: Coronary artery calcifications are present. Heart size is normal. Trace pericardial effusion is noted. Vascular structures: There is atherosclerotic calcification of the thoracic aorta. Bovine arch anatomy. No aneurysm. Mediastinum/thyroid: The visualized portion of the thyroid gland has a normal appearance. No mediastinal, hilar, or axillary adenopathy. Lungs/Airways: No pulmonary nodules, pleural effusions, or infiltrates. Upper abdomen: No focal abnormality identified within the visualized portions of the liver, spleen, adrenal glands. Chest wall/osseous structures: Irregular mass identified within the upper outer quadrant of the left breast. Further evaluation is indicated. Osseous structures are unremarkable. IMPRESSION: 1. Mass within the upper-outer quadrant of the left breast warranting further evaluation. Diagnostic mammography and left breast ultrasound are suggested. 2. No suspicious pulmonary abnormality. 3. Atherosclerosis of the coronary arteries and thoracic aorta. These results will be called to the ordering clinician or representative by the Radiologist Assistant, and communication documented in the PACS or zVision Dashboard. Electronically Signed   By: Nolon Nations M.D.   On: 09/25/2015 09:47   Ct Abdomen Pelvis W Contrast  09/22/2015  CLINICAL DATA:  Hematochezia.  Hypertension and diabetes. EXAM: CT ABDOMEN AND PELVIS WITH CONTRAST TECHNIQUE: Multidetector CT imaging of the abdomen and pelvis was performed using the standard protocol following bolus administration of intravenous contrast. CONTRAST:  1 ISOVUE-300 IOPAMIDOL (ISOVUE-300) INJECTION 61% COMPARISON:  None. FINDINGS: Lower chest: Clear lung bases. Normal heart size without pericardial or pleural effusion. Hepatobiliary: Variant lateral segment left liver lobe extending  in the left upper quadrant. Normal gallbladder, without biliary ductal dilatation. Pancreas: Normal, without mass or ductal dilatation. Spleen: Normal in size, without focal abnormality. Adrenals/Urinary Tract: Normal adrenal glands. Normal kidneys, without hydronephrosis. Normal urinary bladder. Stomach/Bowel: Proximal gastric underdistention. Remainder of the stomach is unremarkable. Equivocal soft tissue fullness at the rectosigmoid junction, including on images 15- 63/series 201 and coronal image 69. The remainder of the colon and terminal ileum are unremarkable. Appendix is likely identified and normal on image 50/ series 201. Normal small bowel. Vascular/Lymphatic: Aortic and branch vessel atherosclerosis. No abdominopelvic adenopathy. Reproductive: Uterine calcifications likely relate to underlying fibroids. Fundal lesion posteriorly at approximately 3.4 cm. No adnexal mass. Other: No significant free fluid. No evidence of omental or peritoneal disease. Mild abdominal wall laxity without well-defined hernia. Musculoskeletal: No acute osseous abnormality. Mild disc bulges at L3-4 and L4-5. IMPRESSION: 1. Soft tissue fullness at the  rectosigmoid junction. Although this could be related to underdistention and stool, polyp or carcinoma cannot be excluded in this patient with hematochezia. 2. Otherwise, no acute process in the abdomen or pelvis. 3. Uterine fibroids. Electronically Signed   By: Abigail Miyamoto M.D.   On: 09/22/2015 20:53   EUS 10/05/2015 Dr. Ardis Hughs  IMPRESSION:  Partially circumferential, 6cm long uT3N0 rectosigmoid adenocarcinoma with distal edge located 9cm from the anal verge. Submucosal injections of [place] Ink, at proximal and distal edges of the mass.  Colonoscopy and EGD 09/24/2015 Dr. Collene Mares  - Large circumferental polypoid mass in the rectosigmoid colon from 10-20 cm-biopsies Done. - Normal appearing, widely patent esophagus and GEJ. - Small hiatal hernia noted on retroflexion. - Mild  antral gastritis-otherwise normal appearing stomach. - Normal examined duodenum. - No specimens collected.  ASSESSMENT & PLAN: 60 year old female with past medical history of diabetes and hypertension, presented with rectal bleeding. Colonoscopy showed a large proximal rectal mass.  1. Rectal adenocarcinoma, proximal rectum, uT3N0Mo, stage IIA -I have reviewed her colonoscopy, EUS, and CT of abdomen pelvis finding with pt in details -The EUS showed a T3 lesion, no suspicious lymphadenopathy. The CT scan showed no evidence of adenopathy or distant metastasis. She has stage IIA disease -We reviewed the natural history of rectal cancer and risk of recurrence after surgery. Given the stage II a disease, she likely has moderate risk of recurrence. We discussed the treatment option for stage IIA rectal cancer. The standard of care is neoadjuvant chemotherapy and radiation, followed by surgery, then adjuvant chemotherapy. -I discussed the option of continuous 5-FU infusion and Xeloda with concurrent radiation. The potential side effects and benefits were discussed with patient. She opted Xeloda.prescription of xeloda 1500mg  (825mg /m2) twice daily will be sent out today  --Chemotherapy consent: Side effects including but does not not limited to, fatigue, nausea, vomiting, diarrhea, hair loss, neuropathy, fluid retention, renal and kidney dysfunction, neutropenic fever, needed for blood transfusion, bleeding, coronary artery spasm and heart attack, were discussed with patient in great detail. She agrees to proceed. -He is also scheduled to see radiation oncologist Dr. Lisbeth Renshaw tomorrow  2. Iron deficient anemia secondary to GI bleeding -Her recent iron study was consistent with iron deficient anemia. -She has received blood transfusion and ferric gluconate 125 mg -Giving her moderate anemia, and a upcoming chemotherapy and radiation, I recommend her to receive IV Feraheme to improve her anemia quickly -I  encouraged her to try oral ferrous sulfate 1-2 tablets a day  3. Anxiety  -Secondary to cancer diagnosis. -I offered social work consult, she states she has strong family support, does not need at this point.  -She does not feel needing medication this point.  4. HTN and DM -Continue medication. She'll follow-up with her primary care physician -I discussed the impact of chemotherapy on her blood pressure and diabetes, we'll monitor her blood pressure and glucose level closely, and is just the medication if needed.  Plan -lab  -IV Feraheme infusion 520 mg twice in the next 2 weeks -I will see her on his first day of chemoRT  All questions were answered. The patient knows to call the clinic with any problems, questions or concerns.  I spent 55 minutes counseling the patient face to face. The total time spent in the appointment was 60 minutes and more than 50% was on counseling.     [name] Merle, MD 10/10/2015 2:59 PM

## 2015-10-11 ENCOUNTER — Ambulatory Visit
Admission: RE | Admit: 2015-10-11 | Discharge: 2015-10-11 | Disposition: A | Discharge status: Home / Self Care | Payer: 59 | Source: Ambulatory Visit | Attending: Radiation Oncology | Admitting: Radiation Oncology

## 2015-10-11 ENCOUNTER — Other Ambulatory Visit (HOSPITAL_BASED_OUTPATIENT_CLINIC_OR_DEPARTMENT_OTHER): Payer: 59

## 2015-10-11 ENCOUNTER — Encounter: Payer: Self-pay | Admitting: Pharmacist

## 2015-10-11 ENCOUNTER — Telehealth: Payer: Self-pay | Admitting: *Deleted

## 2015-10-11 DIAGNOSIS — E119 Type 2 diabetes mellitus without complications: Secondary | ICD-10-CM | POA: Insufficient documentation

## 2015-10-11 DIAGNOSIS — C20 Malignant neoplasm of rectum: Secondary | ICD-10-CM | POA: Diagnosis present

## 2015-10-11 DIAGNOSIS — Z510 Encounter for antineoplastic radiation therapy: Secondary | ICD-10-CM | POA: Insufficient documentation

## 2015-10-11 DIAGNOSIS — I10 Essential (primary) hypertension: Secondary | ICD-10-CM | POA: Diagnosis not present

## 2015-10-11 DIAGNOSIS — D509 Iron deficiency anemia, unspecified: Secondary | ICD-10-CM

## 2015-10-11 DIAGNOSIS — E785 Hyperlipidemia, unspecified: Secondary | ICD-10-CM | POA: Insufficient documentation

## 2015-10-11 LAB — COMPREHENSIVE METABOLIC PANEL
ALBUMIN: 3.8 g/dL (ref 3.5–5.0)
ALK PHOS: 78 U/L (ref 40–150)
ALT: 13 U/L (ref 0–55)
ANION GAP: 14 meq/L — AB (ref 3–11)
AST: 12 U/L (ref 5–34)
BUN: 18.3 mg/dL (ref 7.0–26.0)
CALCIUM: 10.1 mg/dL (ref 8.4–10.4)
CHLORIDE: 100 meq/L (ref 98–109)
CO2: 29 mEq/L (ref 22–29)
CREATININE: 0.8 mg/dL (ref 0.6–1.1)
EGFR: 85 mL/min/{1.73_m2} — ABNORMAL LOW (ref >90)
Glucose: 112 mg/dl (ref 70–140)
POTASSIUM: 3.4 meq/L — AB (ref 3.5–5.1)
Sodium: 142 mEq/L (ref 136–145)
Total Bilirubin: 0.46 mg/dL (ref 0.20–1.20)
Total Protein: 7.4 g/dL (ref 6.4–8.3)

## 2015-10-11 LAB — CBC WITH DIFFERENTIAL/PLATELET
BASO%: 0.5 % (ref 0.0–2.0)
Basophils Absolute: 0 10*3/uL (ref 0.0–0.1)
EOS%: 0.5 % (ref 0.0–7.0)
Eosinophils Absolute: 0 10*3/uL (ref 0.0–0.5)
HEMATOCRIT: 33.3 % — AB (ref 34.8–46.6)
HEMOGLOBIN: 10.2 g/dL — AB (ref 11.6–15.9)
LYMPH#: 1.4 10*3/uL (ref 0.9–3.3)
LYMPH%: 22.1 % (ref 14.0–49.7)
MCH: 22.4 pg — AB (ref 25.1–34.0)
MCHC: 30.6 g/dL — AB (ref 31.5–36.0)
MCV: 73.2 fL — ABNORMAL LOW (ref 79.5–101.0)
MONO#: 0.6 10*3/uL (ref 0.1–0.9)
MONO%: 8.4 % (ref 0.0–14.0)
NEUT#: 4.5 10*3/uL (ref 1.5–6.5)
NEUT%: 68.5 % (ref 38.4–76.8)
PLATELETS: 439 10*3/uL — AB (ref 145–400)
RBC: 4.55 10*6/uL (ref 3.70–5.45)
RDW: 24.9 % — AB (ref 11.2–14.5)
WBC: 6.5 10*3/uL (ref 3.9–10.3)

## 2015-10-11 MED FILL — CAPECITABINE 500 MG TABLET: 500 | 15 days supply | Qty: 90 | Fill #0

## 2015-10-11 NOTE — Progress Notes (Signed)
Radiation Oncology         [phone] ________________________________  Name: Gulianna Hornsby Pletz MRN: [id number]  Date: 10/11/2015  DOB: 1956/03/10  XT:GGYIR Jenny Reichmann, MD  Leighton Ruff, MD     REFERRING PHYSICIAN: Leighton Ruff, MD   DIAGNOSIS: The encounter diagnosis was Rectal cancer [facility]).   Rectal cancer.  HISTORY OF PRESENT ILLNESS::Madelena N Vantuyl is a 60 y.o. female who is seen for an initial consultation visit. She presented months ago with rectal bleeding for one month. She was seen by her PCP, Dr. Jenny Reichmann, and lab work showed anemia. She was sent to the ED 09/22/2015 and received a blood transfusion. She underwent a colonoscopy which showed a partially obstructive proximal rectal mass and biopsy revealed it was adenocarcinoma. She was referred to Dr. Ardis Hughs and underwent EUS which showed a T3 N0 rectal mass. She has not started Xeloda yet. She is here today to discuss radiation treatment options with Dr. Lisbeth Renshaw.  She had a mammogram yesterday and a spot was noted in her breast. Dr. Burr Medico is going to follow up with her with those results. She met with Dr. Marcello Moores last week.   PREVIOUS RADIATION THERAPY: No   PAST MEDICAL HISTORY:  has a past medical history of BACK PAIN (04/13/2010); BACK STRAIN, LUMBAR (04/13/2010); DIABETES MELLITUS, TYPE II (06/02/2008); HYPERLIPIDEMIA (06/02/2008); and HYPERTENSION (06/02/2008).     PAST SURGICAL HISTORY: Past Surgical History  Procedure Laterality Date  . S/p breast biopsy  4/07    benign  . Colonoscopy Left 09/24/2015    Procedure: COLONOSCOPY;  Decaprio: Juanita Craver, MD;  Location: [facility] ENDOSCOPY;  Service: Endoscopy;  Laterality: Left;  . Esophagogastroduodenoscopy N/A 09/24/2015    Procedure: ESOPHAGOGASTRODUODENOSCOPY (EGD);  Junker: Juanita Craver, MD;  Location: [facility] ENDOSCOPY;  Service: Endoscopy;  Laterality: N/A;  . Eus N/A 10/05/2015    Procedure: LOWER ENDOSCOPIC ULTRASOUND (EUS);  Gironda: Milus Banister, MD;  Location: [facility] ENDOSCOPY;   Service: Endoscopy;  Laterality: N/A;     FAMILY HISTORY: family history includes Diabetes in her brother, father, mother, other, and sister; Heart disease in her father; Hypertension in her father.   SOCIAL HISTORY:  reports that she has never smoked. She does not have any smokeless tobacco history on file. She reports that she does not drink alcohol or use illicit drugs.   ALLERGIES: Review of patient's allergies indicates no known allergies.   MEDICATIONS:  Current Outpatient Prescriptions  Medication Sig Dispense Refill  . amLODipine (NORVASC) 10 MG tablet Take 1 tablet (10 mg total) by mouth daily. 30 tablet 10  . atenolol (TENORMIN) 25 MG tablet Take 0.5 tablets (12.5 mg total) by mouth 2 (two) times daily. 60 tablet 0  . atorvastatin (LIPITOR) 20 MG tablet Take 1 tablet (20 mg total) by mouth daily. 90 tablet 3  . capecitabine (XELODA) 500 MG tablet Take 3 tablets (1,500 mg total) by mouth 2 (two) times daily after a meal. (Patient not taking: Reported on 10/11/2015) 90 tablet 1  . clonazePAM (KLONOPIN) 0.5 MG tablet Take 1 tablet (0.5 mg total) by mouth 2 (two) times daily as needed (anxiety). (Patient not taking: Reported on 10/11/2015) 30 tablet 0  . GLIPIZIDE XL 5 MG 24 hr tablet TAKE TWO TABLETS BY MOUTH DAILY 180 tablet 2  . HYDROcodone-acetaminophen (NORCO) 5-325 MG tablet Take 1 tablet by mouth every 6 (six) hours as needed for moderate pain. 20 tablet 0  . irbesartan (AVAPRO) 300 MG tablet Take 1 tablet (300 mg total) by mouth  at bedtime. 90 tablet 3  . KLOR-CON M10 10 MEQ tablet TAKE 1 TABLET DAILY (Patient taking differently: Take 1 tablet by mouth daily) 90 tablet 0  . sitaGLIPtin-metformin (JANUMET) 50-1000 MG per tablet Take 1 tablet by mouth 2 (two) times daily. 180 tablet 3  . [DISCONTINUED] nebivolol (BYSTOLIC) 10 MG tablet Take 1 tablet (10 mg total) by mouth daily. 90 tablet 3  . [DISCONTINUED] valsartan (DIOVAN) 320 MG tablet Take 1 tablet (320 mg total) by mouth  daily. 90 tablet 3   No current facility-administered medications for this encounter.    REVIEW OF SYSTEMS:  A 15 point review of systems is documented in the electronic medical record. This was obtained by the nursing staff. However, I reviewed this with the patient to discuss relevant findings and make appropriate changes.  She mentions she has intermittent constipation and has spotting from her rectum occasionally. She denies problems with her bladder. She denies nausea or vomiting. She mentions she has chronic back pain. She mentions that she does not have pain right now. She mentions that she has been more fatigued than normal lately.  PHYSICAL EXAM:  vitals were not taken for this visit.   The lungs are clear to auscultation. The heart has a regular rhythm and rate. The abdomen is soft and nontender with normal bowel sounds. No palpable supraclavicular, cervical, or axillary adenopathy.  No external lesions. No palpable mass on digital rectal exam. No active bleeding.  Vitals with BMI 10/11/2015  Height 5' 5.5"  Weight 153 lbs 11 oz  BMI 00.7  Systolic 121  Diastolic 63  Pulse 975  Respirations 16    ECOG = 1  0 - Asymptomatic (Fully active, able to carry on all predisease activities without restriction)  1 - Symptomatic but completely ambulatory (Restricted in physically strenuous activity but ambulatory and able to carry out work of a light or sedentary nature. For example, light housework, office work)  2 - Symptomatic, <50% in bed during the day (Ambulatory and capable of all self care but unable to carry out any work activities. Up and about more than 50% of waking hours)  3 - Symptomatic, >50% in bed, but not bedbound (Capable of only limited self-care, confined to bed or chair 50% or more of waking hours)  4 - Bedbound (Completely disabled. Cannot carry on any self-care. Totally confined to bed or chair)  5 - Death   Eustace Pen MM, Creech RH, Tormey DC, et al. [phone]).  "Toxicity and response criteria of the [facility]". [place] Oncol. 5 (6): 649-55    LABORATORY DATA:  Lab Results  Component Value Date   WBC 6.5 10/11/2015   HGB 10.2* 10/11/2015   HCT 33.3* 10/11/2015   MCV 73.2* 10/11/2015   PLT 439* 10/11/2015   Lab Results  Component Value Date   NA 142 10/11/2015   K 3.4* 10/11/2015   CL 110 09/26/2015   CO2 29 10/11/2015   Lab Results  Component Value Date   ALT 13 10/11/2015   AST 12 10/11/2015   ALKPHOS 78 10/11/2015   BILITOT 0.46 10/11/2015      RADIOGRAPHY: Ct Chest W Contrast  09/25/2015  CLINICAL DATA:  Pt admitted for hematochezia x 1 month, with recurrent red blood per rectum, without pain; pt found to have a rectal mass on colonoscopy Pt denies any chest pain, sob, cough,etc. EXAM: CT CHEST WITH CONTRAST TECHNIQUE: Multidetector CT imaging of the chest was performed during intravenous  contrast administration. CONTRAST:  ISOVUE-300 IOPAMIDOL (ISOVUE-300) INJECTION 61% COMPARISON:  09/22/2015 FINDINGS: Heart: Coronary artery calcifications are present. Heart size is normal. Trace pericardial effusion is noted. Vascular structures: There is atherosclerotic calcification of the thoracic aorta. Bovine arch anatomy. No aneurysm. Mediastinum/thyroid: The visualized portion of the thyroid gland has a normal appearance. No mediastinal, hilar, or axillary adenopathy. Lungs/Airways: No pulmonary nodules, pleural effusions, or infiltrates. Upper abdomen: No focal abnormality identified within the visualized portions of the liver, spleen, adrenal glands. Chest wall/osseous structures: Irregular mass identified within the upper outer quadrant of the left breast. Further evaluation is indicated. Osseous structures are unremarkable. IMPRESSION: 1. Mass within the upper-outer quadrant of the left breast warranting further evaluation. Diagnostic mammography and left breast ultrasound are suggested. 2. No suspicious pulmonary  abnormality. 3. Atherosclerosis of the coronary arteries and thoracic aorta. These results will be called to the ordering clinician or representative by the Radiologist Assistant, and communication documented in the PACS or zVision Dashboard. Electronically Signed   By: Nolon Nations M.D.   On: 09/25/2015 09:47   Ct Abdomen Pelvis W Contrast  09/22/2015  CLINICAL DATA:  Hematochezia.  Hypertension and diabetes. EXAM: CT ABDOMEN AND PELVIS WITH CONTRAST TECHNIQUE: Multidetector CT imaging of the abdomen and pelvis was performed using the standard protocol following bolus administration of intravenous contrast. CONTRAST:  1 ISOVUE-300 IOPAMIDOL (ISOVUE-300) INJECTION 61% COMPARISON:  None. FINDINGS: Lower chest: Clear lung bases. Normal heart size without pericardial or pleural effusion. Hepatobiliary: Variant lateral segment left liver lobe extending in the left upper quadrant. Normal gallbladder, without biliary ductal dilatation. Pancreas: Normal, without mass or ductal dilatation. Spleen: Normal in size, without focal abnormality. Adrenals/Urinary Tract: Normal adrenal glands. Normal kidneys, without hydronephrosis. Normal urinary bladder. Stomach/Bowel: Proximal gastric underdistention. Remainder of the stomach is unremarkable. Equivocal soft tissue fullness at the rectosigmoid junction, including on images 15- 63/series 201 and coronal image 69. The remainder of the colon and terminal ileum are unremarkable. Appendix is likely identified and normal on image 50/ series 201. Normal small bowel. Vascular/Lymphatic: Aortic and branch vessel atherosclerosis. No abdominopelvic adenopathy. Reproductive: Uterine calcifications likely relate to underlying fibroids. Fundal lesion posteriorly at approximately 3.4 cm. No adnexal mass. Other: No significant free fluid. No evidence of omental or peritoneal disease. Mild abdominal wall laxity without well-defined hernia. Musculoskeletal: No acute osseous abnormality. Mild  disc bulges at L3-4 and L4-5. IMPRESSION: 1. Soft tissue fullness at the rectosigmoid junction. Although this could be related to underdistention and stool, polyp or carcinoma cannot be excluded in this patient with hematochezia. 2. Otherwise, no acute process in the abdomen or pelvis. 3. Uterine fibroids. Electronically Signed   By: Abigail Miyamoto M.D.   On: 09/22/2015 20:53    IMPRESSION: Ms. Aguila is a 60 yo woman with T3N0M0 rectal cancer. She is a good candidate for external radiation and chemotherapy concurrently followed by surgery.   PLAN: We discussed the role of radiation and its side effects. We discussed that we would do radiation and chemotherapy concurrently, then she would have surgery. We discussed that if she had side effects, they could be treated with medication. We discussed the simulation and planning appointment. She will be scheduled for CT simulation in the near future and we will plan to start her radiation treatment on 10/23/2015. This procedure has been fully reviewed with the patient and written informed consent has been obtained.  I spent 40 minutes face to face with the patient and more than 50% of that time  was spent in counseling and/or coordination of care.    ________________________________   Jodelle Gross, MD, PhD    This document serves as a record of services personally performed by Kyung Rudd, MD. It was created on his behalf by  Lendon Collar, a trained medical scribe. The creation of this record is based on the scribe's personal observations and the provider's statements to them. This document has been checked and approved by the attending provider.

## 2015-10-11 NOTE — Progress Notes (Signed)
Ok to cont both meds from my point of view

## 2015-10-11 NOTE — Progress Notes (Signed)
Please see the Nurse Progress Note in the MD Initial Consult Encounter for this patient. 

## 2015-10-11 NOTE — Progress Notes (Signed)
Will be OK to do both, will monitor glucose closely and I will let pt know also.  Thanks  [name] Karen Good

## 2015-10-11 NOTE — Telephone Encounter (Signed)
Xeloda script given to oral chemo Pharmacist.

## 2015-10-11 NOTE — Progress Notes (Addendum)
GI Location of Tumor / Histology: Colon   Karen Good presented  months ago with symptoms of: rectal bleeding   Biopsies of  (if applicable) revealed: Diagnosis 09/24/2015 : Colon, biopsy, Rectal sigmoid mass=- ADENOCARCINOMA. .  Past/Anticipated interventions by [facility], if any: EGD and Colonoscopy 09/24/15 with bx, Dr. Owens Loffler, 10/05/15=EUS  Past/Anticipated interventions by medical oncology, if any: Dr. Burr Medico appt 10/10/15   Weight changes, if any: NO  Bowel/Bladder complaints, if any: intermittent constipation, no problems with her bladder Nausea / Vomiting, if any: NO  Pain issues, if any: NONE AT PRESENT  Any blood per rectum: yes, SPOTTING OCCASIONALLY   SAFETY ISSUES: NO  Prior radiation? NO  Pacemaker/ICD? NO  Possible current pregnancy? NO  Is the patient on methotrexate? NO  Current Complaints/Details:Married,  2 CHILDREN, non smoker,no alcohol or illicit drug use, Hasn't started Xeloda as yet BP 146/63 mmHg  Pulse 105  Temp(Src) 98.2 F (36.8 C) (Oral)  Resp 16  Ht 5' 5.5" (1.664 m)  Wt 153 lb 11.2 oz (69.718 kg)  BMI 25.18 kg/m2  SpO2 100%  Wt Readings from Last 3 Encounters:  10/11/15 153 lb 11.2 oz (69.718 kg)  10/10/15 152 lb 9.6 oz (69.219 kg)  10/05/15 158 lb (71.668 kg)

## 2015-10-11 NOTE — Progress Notes (Signed)
New Rx for Xeloda faxed to [facility].  Awaiting review/need for PA/insurance coverage, etc. Dr. Burr Medico plans for pt to take 3 tabs BID after meals on Mon-Fri (no weekends) while on XRT x 6 weeks total. Start date of XRT pending.  Note: possible drug interaction detected (source: [employer], accessed 10/11/15) between Xeloda (CYP 2C9 strong inhibitor) and Glipizide (CYP 2C9 substrate).  Risk category D, consider therapy modification.  Capecitabine may increase the effects of glipizide due to increased serum concentrations of glipizide. I will forward along to Dr. Burr Medico & Dr. Cathlean Cower, [facility].  Kennith Center, Pharm.D., CPP 10/11/2015@9 :[place]

## 2015-10-12 ENCOUNTER — Encounter: Payer: Self-pay | Admitting: Pharmacist

## 2015-10-12 ENCOUNTER — Telehealth: Payer: Self-pay

## 2015-10-12 ENCOUNTER — Other Ambulatory Visit: Payer: 59

## 2015-10-12 ENCOUNTER — Encounter: Payer: Self-pay | Admitting: *Deleted

## 2015-10-12 ENCOUNTER — Telehealth: Payer: Self-pay | Admitting: *Deleted

## 2015-10-12 LAB — IRON AND TIBC
%SAT: 5 % — ABNORMAL LOW (ref 21–57)
IRON: 24 ug/dL — AB (ref 41–142)
TIBC: 444 ug/dL (ref 236–444)
UIBC: 420 ug/dL — AB (ref 120–384)

## 2015-10-12 LAB — FERRITIN: Ferritin: 34 ng/ml (ref 9–269)

## 2015-10-12 MED ORDER — HYDROCODONE-ACETAMINOPHEN 5-325 MG PO TABS: 1.0000 | ORAL_TABLET | Freq: Four times a day (QID) | ORAL | Status: DC | PRN

## 2015-10-12 NOTE — Telephone Encounter (Signed)
Attempted to reach patient to introduce her to nurse navigator. No answer. Will meet with her on 7/14 iron infusion appointment.

## 2015-10-12 NOTE — Telephone Encounter (Signed)
Done hardcopy to Corinne  Please let pt know that I normally dont treat chronic pain with these types of medications, so i am hoping this will not be needed for the long term

## 2015-10-12 NOTE — Progress Notes (Signed)
I s/w pt in the [facility] today after she completed chemo ed class w/ Jethro Bastos, RN.   Pt was counseled in detail on Xeloda today. Her Xeloda Rx is ready to be picked up at [facility] ($25 copay) - pt aware and plans to go by there today after she leaves.  She understands that there is a potential interaction b/w Xeloda and Glipizide and that both Drs. Burr Medico & Jenny Reichmann are ok w/ her continuing the Glipizide. She has a glucometer but it is old and she needs a new one.  She may try to get a loaner to use just while she is on Xeloda, esp the 1st 2-3 weeks of Xeloda.  She understands the warning s/sxs of hypoglycemia and how to treat that. I provided her with our contact number for oral chemo clinic.  Her XRT start date is pending but projected for 10/23/15 per pt. We will call pt on 10/27/15 to determine tolerance, compliance and re-enforce Xeloda teaching if necessary.  Kennith Center, Pharm.D., CPP 10/12/2015@11 :44 AM Oral Chemo Clinic

## 2015-10-12 NOTE — Telephone Encounter (Signed)
Please advise patient is requesting refill on hydrocodone

## 2015-10-12 NOTE — Telephone Encounter (Signed)
Patient advised prescription is in front office for pick up

## 2015-10-12 NOTE — Addendum Note (Signed)
Addended by: Biagio Borg on: 10/12/2015 04:45 PM   Modules accepted: Orders

## 2015-10-16 ENCOUNTER — Ambulatory Visit
Admission: RE | Admit: 2015-10-16 | Discharge: 2015-10-16 | Disposition: A | Discharge status: Home / Self Care | Payer: 59 | Source: Ambulatory Visit | Attending: Radiation Oncology | Admitting: Radiation Oncology

## 2015-10-16 DIAGNOSIS — C20 Malignant neoplasm of rectum: Secondary | ICD-10-CM

## 2015-10-16 DIAGNOSIS — Z510 Encounter for antineoplastic radiation therapy: Secondary | ICD-10-CM | POA: Diagnosis not present

## 2015-10-19 DIAGNOSIS — Z510 Encounter for antineoplastic radiation therapy: Secondary | ICD-10-CM | POA: Diagnosis not present

## 2015-10-20 ENCOUNTER — Other Ambulatory Visit: Payer: Self-pay | Admitting: *Deleted

## 2015-10-20 ENCOUNTER — Ambulatory Visit (HOSPITAL_BASED_OUTPATIENT_CLINIC_OR_DEPARTMENT_OTHER): Payer: 59

## 2015-10-20 VITALS — BP 142/71 | HR 90 | Temp 98.5°F | Resp 18

## 2015-10-20 DIAGNOSIS — D509 Iron deficiency anemia, unspecified: Secondary | ICD-10-CM

## 2015-10-20 MED ORDER — SODIUM CHLORIDE 0.9 % IV SOLN
Freq: Once | INTRAVENOUS | Status: AC
  Administered 2015-10-20: 10:00:00 via INTRAVENOUS

## 2015-10-20 MED ORDER — SODIUM CHLORIDE 0.9 % IV SOLN
510.0000 mg | Freq: Once | INTRAVENOUS | Status: AC
  Administered 2015-10-20: 510 mg via INTRAVENOUS
  Filled 2015-10-20: qty 17

## 2015-10-20 NOTE — Patient Instructions (Signed)

## 2015-10-20 NOTE — Progress Notes (Signed)
Pt tolerated feraheme infusion well, pt monitored 30 minutes post infusion. Pt and VS stable at time of discharge.

## 2015-10-24 ENCOUNTER — Ambulatory Visit (HOSPITAL_BASED_OUTPATIENT_CLINIC_OR_DEPARTMENT_OTHER): Payer: 59 | Admitting: Hematology

## 2015-10-24 ENCOUNTER — Encounter: Payer: Self-pay | Admitting: Hematology

## 2015-10-24 ENCOUNTER — Other Ambulatory Visit (HOSPITAL_BASED_OUTPATIENT_CLINIC_OR_DEPARTMENT_OTHER): Payer: 59

## 2015-10-24 ENCOUNTER — Ambulatory Visit
Admission: RE | Admit: 2015-10-24 | Discharge: 2015-10-24 | Disposition: A | Discharge status: Home / Self Care | Payer: 59 | Source: Ambulatory Visit | Attending: Radiation Oncology | Admitting: Radiation Oncology

## 2015-10-24 VITALS — BP 145/61 | HR 107 | Temp 98.2°F | Resp 18 | Ht 65.5 in | Wt 154.3 lb

## 2015-10-24 DIAGNOSIS — D500 Iron deficiency anemia secondary to blood loss (chronic): Secondary | ICD-10-CM | POA: Diagnosis not present

## 2015-10-24 DIAGNOSIS — F419 Anxiety disorder, unspecified: Secondary | ICD-10-CM

## 2015-10-24 DIAGNOSIS — E119 Type 2 diabetes mellitus without complications: Secondary | ICD-10-CM

## 2015-10-24 DIAGNOSIS — N6320 Unspecified lump in the left breast, unspecified quadrant: Secondary | ICD-10-CM

## 2015-10-24 DIAGNOSIS — D509 Iron deficiency anemia, unspecified: Secondary | ICD-10-CM

## 2015-10-24 DIAGNOSIS — C20 Malignant neoplasm of rectum: Secondary | ICD-10-CM | POA: Diagnosis not present

## 2015-10-24 DIAGNOSIS — N63 Unspecified lump in breast: Secondary | ICD-10-CM

## 2015-10-24 DIAGNOSIS — Z510 Encounter for antineoplastic radiation therapy: Secondary | ICD-10-CM | POA: Diagnosis not present

## 2015-10-24 DIAGNOSIS — I10 Essential (primary) hypertension: Secondary | ICD-10-CM

## 2015-10-24 LAB — COMPREHENSIVE METABOLIC PANEL
ALT: 14 U/L (ref 0–55)
AST: 14 U/L (ref 5–34)
Albumin: 3.8 g/dL (ref 3.5–5.0)
Alkaline Phosphatase: 76 U/L (ref 40–150)
Anion Gap: 11 mEq/L (ref 3–11)
BILIRUBIN TOTAL: 0.65 mg/dL (ref 0.20–1.20)
BUN: 18.6 mg/dL (ref 7.0–26.0)
CHLORIDE: 103 meq/L (ref 98–109)
CO2: 27 meq/L (ref 22–29)
Calcium: 9.9 mg/dL (ref 8.4–10.4)
Creatinine: 0.7 mg/dL (ref 0.6–1.1)
GLUCOSE: 119 mg/dL (ref 70–140)
Potassium: 3.9 mEq/L (ref 3.5–5.1)
SODIUM: 142 meq/L (ref 136–145)
TOTAL PROTEIN: 7.2 g/dL (ref 6.4–8.3)

## 2015-10-24 LAB — CBC WITH DIFFERENTIAL/PLATELET
BASO%: 0.2 % (ref 0.0–2.0)
Basophils Absolute: 0 10*3/uL (ref 0.0–0.1)
EOS ABS: 0.1 10*3/uL (ref 0.0–0.5)
EOS%: 0.9 % (ref 0.0–7.0)
HCT: 32.1 % — ABNORMAL LOW (ref 34.8–46.6)
HGB: 10 g/dL — ABNORMAL LOW (ref 11.6–15.9)
LYMPH%: 24.7 % (ref 14.0–49.7)
MCH: 22.8 pg — ABNORMAL LOW (ref 25.1–34.0)
MCHC: 31.2 g/dL — AB (ref 31.5–36.0)
MCV: 73.3 fL — AB (ref 79.5–101.0)
MONO#: 0.3 10*3/uL (ref 0.1–0.9)
MONO%: 4.9 % (ref 0.0–14.0)
NEUT#: 4.4 10*3/uL (ref 1.5–6.5)
NEUT%: 69.3 % (ref 38.4–76.8)
Platelets: 344 10*3/uL (ref 145–400)
RBC: 4.38 10*6/uL (ref 3.70–5.45)
RDW: 25.2 % — ABNORMAL HIGH (ref 11.2–14.5)
WBC: 6.4 10*3/uL (ref 3.9–10.3)
lymph#: 1.6 10*3/uL (ref 0.9–3.3)

## 2015-10-24 MED ORDER — ONDANSETRON HCL 8 MG PO TABS: 8.0000 mg | ORAL_TABLET | Freq: Three times a day (TID) | ORAL | Status: DC | PRN

## 2015-10-24 NOTE — Progress Notes (Signed)
[place]  Telephone:([phone]) [phone] [phone]) [phone]  Clinic Follow up Note   Patient Care Team: Biagio Borg, MD as PCP - General Kyung Rudd, MD as Consulting Physician (Radiation Oncology) Leighton Ruff, MD as Consulting Physician (General Surgery) Milus Banister, MD as Attending Physician (Gastroenterology) 10/24/2015   CHIEF COMPLAINTS:  Follow up rectal cancer  HISTORY OF PRESENTING ILLNESS:  Karen Good 60 y.o. female is here because of her newly diagnosed rectal cancer. She presents to my clinic with her husband.  She had had intermittent bloody stool for one month, somewhat mount, mixed with stool, with worsening fatigue, and 6 lbs weight loss, no significant abdominal pain, nausea, dyspnea, or other symptoms. She was seen by her PCP Dr. Jenny Reichmann and lab work showed anemia. She was sent to ED on 6/9 and received blood transfusion for Hb 6.9. She underwent a colonoscopy which showed a partially obstructive proximal rectal mass, biopsy showed adenocarcinoma. She was subsequently referred to Dr. Ardis Hughs and underwent EUS which showed a T3 N0 rectal mass. She was seen by [place] Dr. Marcello Moores last week, and is also scheduled to see radiation oncologist Dr. Lisbeth Renshaw tomorrow.  She has felt much better overall after blood transfusion. She did receive IV ferric gluconate 125 mg in the hospital, but is not on oral iron supplements. She feels well overall, has good appetite and energy level, but does feel anxious since her cancer diagnosis.  CURRENT THERAPY: Concurrent chemotherapy and irradiation with Xeloda 15 mg twice daily, starting on 10/25/15  INTERIM HISTORY: Sanita returns for follow-up. She is scheduled to start chemoradiation tomorrow. She is here for simulation today. She did well overall, denies any new change since her last visit. She tolerated the first IV Feraheme well, and feels better overall. No other new complaints   MEDICAL HISTORY:  Past Medical History    Diagnosis Date  . BACK PAIN 04/13/2010  . BACK STRAIN, LUMBAR 04/13/2010  . DIABETES MELLITUS, TYPE II 06/02/2008  . HYPERLIPIDEMIA 06/02/2008  . HYPERTENSION 06/02/2008    SURGICAL HISTORY: Past Surgical History  Procedure Laterality Date  . S/p breast biopsy  4/07    benign  . Colonoscopy Left 09/24/2015    Procedure: COLONOSCOPY;  [place]: Juanita Craver, MD;  Location: [facility] ENDOSCOPY;  Service: Endoscopy;  Laterality: Left;  . Esophagogastroduodenoscopy N/A 09/24/2015    Procedure: ESOPHAGOGASTRODUODENOSCOPY (EGD);  [place]: Juanita Craver, MD;  Location: [facility] ENDOSCOPY;  Service: Endoscopy;  Laterality: N/A;  . Eus N/A 10/05/2015    Procedure: LOWER ENDOSCOPIC ULTRASOUND (EUS);  [place]: Milus Banister, MD;  Location: [facility] ENDOSCOPY;  Service: Endoscopy;  Laterality: N/A;    SOCIAL HISTORY: Social History   Social History  . Marital Status: Single    Spouse Name: N/A  . Number of Children: 2  . Years of Education: N/A   Occupational History  .  [employer]   Social History Main Topics  . Smoking status: Never Smoker   . Smokeless tobacco: Not on file  . Alcohol Use: No  . Drug Use: No  . Sexual Activity: Yes    Birth Control/ Protection: Post-menopausal   Other Topics Concern  . Not on file   Social History Narrative    FAMILY HISTORY: Family History  Problem Relation Age of Onset  . Diabetes Mother   . Heart disease Father   . Diabetes Father   . Hypertension Father   . Diabetes Other   . Diabetes Sister   . Diabetes  Brother     ALLERGIES:  has No Known Allergies.  MEDICATIONS:  Current Outpatient Prescriptions  Medication Sig Dispense Refill  . amLODipine (NORVASC) 10 MG tablet Take 1 tablet (10 mg total) by mouth daily. 30 tablet 10  . atenolol (TENORMIN) 25 MG tablet Take 0.5 tablets (12.5 mg total) by mouth 2 (two) times daily. 60 tablet 0  . atorvastatin (LIPITOR) 20 MG tablet Take 1 tablet (20 mg total) by mouth daily. 90 tablet 3  . GLIPIZIDE XL 5 MG  24 hr tablet TAKE TWO TABLETS BY MOUTH DAILY 180 tablet 2  . HYDROcodone-acetaminophen (NORCO) 5-325 MG tablet Take 1 tablet by mouth every 6 (six) hours as needed for moderate pain. 20 tablet 0  . irbesartan (AVAPRO) 300 MG tablet Take 1 tablet (300 mg total) by mouth at bedtime. 90 tablet 3  . KLOR-CON M10 10 MEQ tablet TAKE 1 TABLET DAILY (Patient taking differently: Take 1 tablet by mouth daily) 90 tablet 0  . sitaGLIPtin-metformin (JANUMET) 50-1000 MG per tablet Take 1 tablet by mouth 2 (two) times daily. 180 tablet 3  . capecitabine (XELODA) 500 MG tablet Take 3 tablets (1,500 mg total) by mouth 2 (two) times daily after a meal. (Patient not taking: Reported on 10/11/2015) 90 tablet 1  . clonazePAM (KLONOPIN) 0.5 MG tablet Take 1 tablet (0.5 mg total) by mouth 2 (two) times daily as needed (anxiety). (Patient not taking: Reported on 10/11/2015) 30 tablet 0  . hydrochlorothiazide (HYDRODIURIL) 25 MG tablet     . ondansetron (ZOFRAN) 8 MG tablet Take 1 tablet (8 mg total) by mouth every 8 (eight) hours as needed for nausea. 30 tablet 3  . [DISCONTINUED] nebivolol (BYSTOLIC) 10 MG tablet Take 1 tablet (10 mg total) by mouth daily. 90 tablet 3  . [DISCONTINUED] valsartan (DIOVAN) 320 MG tablet Take 1 tablet (320 mg total) by mouth daily. 90 tablet 3   No current facility-administered medications for this visit.    REVIEW OF SYSTEMS:   Constitutional: Denies fevers, chills or abnormal night sweats Eyes: Denies blurriness of vision, double vision or watery eyes Ears, nose, mouth, throat, and face: Denies mucositis or sore throat Respiratory: Denies cough, dyspnea or wheezes Cardiovascular: Denies palpitation, chest discomfort or lower extremity swelling Gastrointestinal:  Denies nausea, heartburn or change in bowel habits Skin: Denies abnormal skin rashes Lymphatics: Denies new lymphadenopathy or easy bruising Neurological:Denies numbness, tingling or new weaknesses Behavioral/Psych: Mood is  stable, no new changes  All other systems were reviewed with the patient and are negative.  PHYSICAL EXAMINATION: ECOG PERFORMANCE STATUS: 0 - Asymptomatic  Filed Vitals:   10/24/15 1546  BP: 145/61  Pulse: 107  Temp: 98.2 F (36.8 C)  Resp: 18   Filed Weights   10/24/15 1546  Weight: 154 lb 4.8 oz (69.99 kg)    GENERAL:alert, no distress and comfortable SKIN: skin color, texture, turgor are normal, no rashes or significant lesions EYES: normal, conjunctiva are pink and non-injected, sclera clear OROPHARYNX:no exudate, no erythema and lips, buccal mucosa, and tongue normal  NECK: supple, thyroid normal size, non-tender, without nodularity LYMPH:  no palpable lymphadenopathy in the cervical, axillary or inguinal LUNGS: clear to auscultation and percussion with normal breathing effort HEART: regular rate & rhythm, 2/6 systolic murmurs, no lower extremity edema ABDOMEN:abdomen soft, non-tender and normal bowel sounds, no organomegaly. Rectal exam was not performed. Musculoskeletal:no cyanosis of digits and no clubbing  PSYCH: alert & oriented x 3 with fluent speech NEURO: no focal motor/sensory deficits  Breast exam: deferred   LABORATORY DATA:  I have reviewed the data as listed CBC Latest Ref Rng 10/24/2015 10/11/2015 09/26/2015  WBC 3.9 - 10.3 10e3/uL 6.4 6.5 5.4  Hemoglobin 11.6 - 15.9 g/dL 10.0(L) 10.2(L) 9.2(L)  Hematocrit 34.8 - 46.6 % 32.1(L) 33.3(L) 31.5(L)  Platelets 145 - 400 10e3/uL 344 439(H) 381   CMP Latest Ref Rng 10/24/2015 10/11/2015 09/26/2015  Glucose 70 - 140 mg/dl 119 112 153(H)  BUN 7.0 - 26.0 mg/dL 18.6 18.3 <5(L)  Creatinine 0.6 - 1.1 mg/dL 0.7 0.8 0.48  Sodium 136 - 145 mEq/L 142 142 141  Potassium 3.5 - 5.1 mEq/L 3.9 3.4(L) 4.5  Chloride 101 - 111 mmol/L - - 110  CO2 22 - 29 mEq/L 27 29 25   Calcium 8.4 - 10.4 mg/dL 9.9 10.1 8.9  Total Protein 6.4 - 8.3 g/dL 7.2 7.4 -  Total Bilirubin 0.20 - 1.20 mg/dL 0.65 0.46 -  Alkaline Phos 40 - 150 U/L 76 78 -   AST 5 - 34 U/L 14 12 -  ALT 0 - 55 U/L 14 13 -   CEA  Status: Finalresult Visible to patient:  Not Released Nextappt: 10/11/2015 at 03:00 PM in Radiation Oncology ([place])          Ref Range 2wk ago    CEA 0.0 - 4.7 ng/mL 4.1         PATHOLOGY REPORT  Diagnosis 09/24/2015 Colon, biopsy, Rectal sigmoid mass - ADENOCARCINOMA. - SEE COMMENT. Microscopic Comment Dr. Orene Desanctis has reviewed the case and concurs with this interpretation. Dr. Collene Mares was paged on 09/26/15. (JBK:gt, 09/26/15)   RADIOGRAPHIC STUDIES: I have personally reviewed the radiological images as listed and agreed with the findings in the report. Ct Chest W Contrast  09/25/2015  CLINICAL DATA:  Pt admitted for hematochezia x 1 month, with recurrent red blood per rectum, without pain; pt found to have a rectal mass on colonoscopy Pt denies any chest pain, sob, cough,etc. EXAM: CT CHEST WITH CONTRAST TECHNIQUE: Multidetector CT imaging of the chest was performed during intravenous contrast administration. CONTRAST:  ISOVUE-300 IOPAMIDOL (ISOVUE-300) INJECTION 61% COMPARISON:  09/22/2015 FINDINGS: Heart: Coronary artery calcifications are present. Heart size is normal. Trace pericardial effusion is noted. Vascular structures: There is atherosclerotic calcification of the thoracic aorta. Bovine arch anatomy. No aneurysm. Mediastinum/thyroid: The visualized portion of the thyroid gland has a normal appearance. No mediastinal, hilar, or axillary adenopathy. Lungs/Airways: No pulmonary nodules, pleural effusions, or infiltrates. Upper abdomen: No focal abnormality identified within the visualized portions of the liver, spleen, adrenal glands. Chest wall/osseous structures: Irregular mass identified within the upper outer quadrant of the left breast. Further evaluation is indicated. Osseous structures are unremarkable. IMPRESSION: 1. Mass within the upper-outer quadrant of the left breast warranting further evaluation.  Diagnostic mammography and left breast ultrasound are suggested. 2. No suspicious pulmonary abnormality. 3. Atherosclerosis of the coronary arteries and thoracic aorta. These results will be called to the ordering clinician or representative by the Radiologist Assistant, and communication documented in the PACS or zVision Dashboard. Electronically Signed   By: Nolon Nations M.D.   On: 09/25/2015 09:47   EUS 10/05/2015 Dr. Ardis Hughs  IMPRESSION:  Partially circumferential, 6cm long uT3N0 rectosigmoid adenocarcinoma with distal edge located 9cm from the anal verge. Submucosal injections of [place], at proximal and distal edges of the mass.  Colonoscopy and EGD 09/24/2015 Dr. Collene Mares  - Large circumferental polypoid mass in the rectosigmoid colon from 10-20 cm-biopsies Done. - Normal appearing, widely  patent esophagus and GEJ. - Small hiatal hernia noted on retroflexion. - Mild antral gastritis-otherwise normal appearing stomach. - Normal examined duodenum. - No specimens collected.  ASSESSMENT & PLAN: 60 year old female with past medical history of diabetes and hypertension, presented with rectal bleeding. Colonoscopy showed a large proximal rectal mass.  1. Rectal adenocarcinoma, proximal rectum, uT3N0Mo, stage IIA -I have reviewed her colonoscopy, EUS, and CT of abdomen pelvis finding with pt in details -The EUS showed a T3 lesion, no suspicious lymphadenopathy. The CT scan showed no evidence of adenopathy or distant metastasis. She has stage IIA disease -We reviewed the natural history of rectal cancer and risk of recurrence after surgery. Given the stage II a disease, she likely has moderate risk of recurrence. We discussed the treatment option for stage IIA rectal cancer. The standard of care is neoadjuvant chemotherapy and radiation, followed by surgery, then adjuvant chemotherapy. -She is scheduled to start chemotherapy and radiation tomorrow with Xeloda 1500 mg twice daily -I reviewed  potential side effects from Xeloda again, and answered her questions. -We'll check her labs CBC and CMP weekly, and I'll see her every 2 weeks during her concurrent chemoradiation. -She knows to call us if she has any concerns, especially dehydration and fever   2. Iron deficient anemia secondary to GI bleeding -Her recent iron study was consistent with iron deficient anemia. -She has received blood transfusion and ferric gluconate 125 mg, and one dose of IV Feraheme, second is Feraheme is scheduled for later this week -I encouraged her to try oral ferrous sulfate 1 tablet a day  3. Anxiety  -Secondary to cancer diagnosis. -I offered social work consult, she states she has strong family support, does not need at this point.  -She does not feel needing medication this point.  4. HTN and DM -Continue medication. She'll follow-up with her primary care physician -I discussed the impact of chemotherapy on her blood pressure and diabetes, we'll monitor her blood pressure and glucose level closely, and is just the medication if needed.  5. Left breast mass -CT chest revealed a left breast mass, however her screening mammogram was negative a few weeks ago. -I'll request her mammogram report from no one else, and recommended her to have a diagnostic mammogram and ultrasound of left breast and axilla for further evaluation. She agrees  Plan -She will start chemoradiation with Xeloda 1500 mg twice daily tomorrow -Lab weekly, I'll see her back in 2 weeks -Left breast diagnostic mammogram and ultrasound in the next few weeks   All questions were answered. The patient knows to call the clinic with any problems, questions or concerns.  I spent 20 minutes counseling the patient face to face. The total time spent in the appointment was 25 minutes and more than 50% was on counseling.     [name] Merle, MD 10/24/2015 5:40 PM

## 2015-10-25 ENCOUNTER — Ambulatory Visit
Admission: RE | Admit: 2015-10-25 | Discharge: 2015-10-25 | Disposition: A | Discharge status: Home / Self Care | Payer: 59 | Source: Ambulatory Visit | Attending: Radiation Oncology | Admitting: Radiation Oncology

## 2015-10-25 ENCOUNTER — Telehealth: Payer: Self-pay

## 2015-10-25 DIAGNOSIS — Z510 Encounter for antineoplastic radiation therapy: Secondary | ICD-10-CM | POA: Diagnosis not present

## 2015-10-25 LAB — FERRITIN: FERRITIN: 358 ng/mL — AB (ref 9–269)

## 2015-10-25 MED ORDER — POTASSIUM CHLORIDE CRYS ER 10 MEQ PO TBCR: 10.0000 meq | EXTENDED_RELEASE_TABLET | Freq: Every day | ORAL | Status: DC

## 2015-10-25 NOTE — Telephone Encounter (Signed)
Medication refill sent to pharmacy  

## 2015-10-26 ENCOUNTER — Telehealth: Payer: Self-pay | Admitting: *Deleted

## 2015-10-26 ENCOUNTER — Ambulatory Visit
Admission: RE | Admit: 2015-10-26 | Discharge: 2015-10-26 | Disposition: A | Discharge status: Home / Self Care | Payer: 59 | Source: Ambulatory Visit | Attending: Radiation Oncology | Admitting: Radiation Oncology

## 2015-10-26 DIAGNOSIS — Z510 Encounter for antineoplastic radiation therapy: Secondary | ICD-10-CM | POA: Diagnosis not present

## 2015-10-26 NOTE — Telephone Encounter (Signed)
Called for report of mammogram/U/S to [facility] asked that they fax report.

## 2015-10-26 NOTE — Addendum Note (Signed)
Addended by: [name] Merle on: 10/26/2015 04:33 PM   Modules accepted: Orders

## 2015-10-26 NOTE — Addendum Note (Signed)
Addended by: [name] Merle on: 10/26/2015 04:39 PM   Modules accepted: Orders

## 2015-10-27 ENCOUNTER — Ambulatory Visit (HOSPITAL_BASED_OUTPATIENT_CLINIC_OR_DEPARTMENT_OTHER): Payer: 59

## 2015-10-27 ENCOUNTER — Ambulatory Visit
Admission: RE | Admit: 2015-10-27 | Discharge: 2015-10-27 | Disposition: A | Discharge status: Home / Self Care | Payer: 59 | Source: Ambulatory Visit | Attending: Radiation Oncology | Admitting: Radiation Oncology

## 2015-10-27 ENCOUNTER — Telehealth: Payer: Self-pay | Admitting: Hematology

## 2015-10-27 VITALS — BP 124/51 | HR 85 | Temp 98.4°F | Resp 18

## 2015-10-27 DIAGNOSIS — D509 Iron deficiency anemia, unspecified: Secondary | ICD-10-CM

## 2015-10-27 DIAGNOSIS — C20 Malignant neoplasm of rectum: Secondary | ICD-10-CM

## 2015-10-27 DIAGNOSIS — Z510 Encounter for antineoplastic radiation therapy: Secondary | ICD-10-CM | POA: Diagnosis not present

## 2015-10-27 MED ORDER — SODIUM CHLORIDE 0.9 % IV SOLN
Freq: Once | INTRAVENOUS | Status: AC
  Administered 2015-10-27: 14:00:00 via INTRAVENOUS

## 2015-10-27 MED ORDER — SODIUM CHLORIDE 0.9 % IV SOLN
510.0000 mg | Freq: Once | INTRAVENOUS | Status: AC
  Administered 2015-10-27: 510 mg via INTRAVENOUS
  Filled 2015-10-27: qty 17

## 2015-10-27 NOTE — Progress Notes (Addendum)
Karen Good has received 3 fractions to her rectum. She denies any pain at this time.  She denies any rectal irritation.

## 2015-10-27 NOTE — Telephone Encounter (Signed)
per pof to sch pt [facility] and they have no previous images to compare and will not sch per [facility] without them. They will cancel appt if no images are not available-sent dr Burr Medico email to verify

## 2015-10-27 NOTE — Patient Instructions (Signed)

## 2015-10-28 ENCOUNTER — Other Ambulatory Visit: Payer: Self-pay | Admitting: Radiation Oncology

## 2015-10-28 MED ORDER — PROCHLORPERAZINE MALEATE 10 MG PO TABS: 10.0000 mg | ORAL_TABLET | Freq: Four times a day (QID) | ORAL | Status: DC | PRN

## 2015-10-28 NOTE — Progress Notes (Signed)
Department of Radiation Oncology  Phone:  [phone] Fax:        [phone]  Weekly Treatment Note    Name: Karen Good Date: 10/28/2015 MRN: [id number] DOB: Jun 25, 1955   Diagnosis:     ICD-9-CM ICD-10-CM   1. Rectal cancer (HCC) 154.1 C20      Current dose: 5.4 Gy  Current fraction: 3   MEDICATIONS: Current Outpatient Prescriptions  Medication Sig Dispense Refill  . amLODipine (NORVASC) 10 MG tablet Take 1 tablet (10 mg total) by mouth daily. 30 tablet 10  . atenolol (TENORMIN) 25 MG tablet Take 0.5 tablets (12.5 mg total) by mouth 2 (two) times daily. 60 tablet 0  . atorvastatin (LIPITOR) 20 MG tablet Take 1 tablet (20 mg total) by mouth daily. 90 tablet 3  . capecitabine (XELODA) 500 MG tablet Take 3 tablets (1,500 mg total) by mouth 2 (two) times daily after a meal. 90 tablet 1  . hydrochlorothiazide (HYDRODIURIL) 25 MG tablet     . HYDROcodone-acetaminophen (NORCO) 5-325 MG tablet Take 1 tablet by mouth every 6 (six) hours as needed for moderate pain. 20 tablet 0  . irbesartan (AVAPRO) 300 MG tablet Take 1 tablet (300 mg total) by mouth at bedtime. 90 tablet 3  . potassium chloride (KLOR-CON M10) 10 MEQ tablet Take 1 tablet (10 mEq total) by mouth daily. 90 tablet 0  . sitaGLIPtin-metformin (JANUMET) 50-1000 MG per tablet Take 1 tablet by mouth 2 (two) times daily. 180 tablet 3  . clonazePAM (KLONOPIN) 0.5 MG tablet Take 1 tablet (0.5 mg total) by mouth 2 (two) times daily as needed (anxiety). (Patient not taking: Reported on 10/11/2015) 30 tablet 0  . GLIPIZIDE XL 5 MG 24 hr tablet TAKE TWO TABLETS BY MOUTH DAILY 180 tablet 2  . ondansetron (ZOFRAN) 8 MG tablet Take 1 tablet (8 mg total) by mouth every 8 (eight) hours as needed for nausea. (Patient not taking: Reported on 10/26/2015) 30 tablet 3  . [DISCONTINUED] nebivolol (BYSTOLIC) 10 MG tablet Take 1 tablet (10 mg total) by mouth daily. 90 tablet 3  . [DISCONTINUED] valsartan (DIOVAN) 320 MG tablet Take 1  tablet (320 mg total) by mouth daily. 90 tablet 3   No current facility-administered medications for this encounter.     ALLERGIES: Review of patient's allergies indicates no known allergies.   LABORATORY DATA:  Lab Results  Component Value Date   WBC 6.4 10/24/2015   HGB 10.0* 10/24/2015   HCT 32.1* 10/24/2015   MCV 73.3* 10/24/2015   PLT 344 10/24/2015   Lab Results  Component Value Date   NA 142 10/24/2015   K 3.9 10/24/2015   CL 110 09/26/2015   CO2 27 10/24/2015   Lab Results  Component Value Date   ALT 14 10/24/2015   AST 14 10/24/2015   ALKPHOS 76 10/24/2015   BILITOT 0.65 10/24/2015     NARRATIVE: Karen Good [place] was seen today for weekly treatment management. The chart was checked and the patient's films were reviewed.  Mrs. Karen Good has received 3 fractions to her rectum. She denies any pain at this time.  She denies any rectal irritation.  PHYSICAL EXAMINATION: vitals were not taken for this visit.     Alert, no acute distress  ASSESSMENT: The patient is doing satisfactorily with treatment.  PLAN: We will continue with the patient's radiation treatment as planned. I have called in a prescription for Compazine for the patient. She states that Zofran was too expensive.

## 2015-10-30 ENCOUNTER — Other Ambulatory Visit: Payer: Self-pay | Admitting: Emergency Medicine

## 2015-10-30 ENCOUNTER — Telehealth: Payer: Self-pay | Admitting: Hematology

## 2015-10-30 ENCOUNTER — Ambulatory Visit
Admission: RE | Admit: 2015-10-30 | Discharge: 2015-10-30 | Disposition: A | Discharge status: Home / Self Care | Payer: 59 | Source: Ambulatory Visit | Attending: Radiation Oncology | Admitting: Radiation Oncology

## 2015-10-30 ENCOUNTER — Encounter: Payer: Self-pay | Admitting: Hematology

## 2015-10-30 DIAGNOSIS — Z510 Encounter for antineoplastic radiation therapy: Secondary | ICD-10-CM | POA: Diagnosis not present

## 2015-10-30 MED ORDER — HYDROCHLOROTHIAZIDE 25 MG PO TABS: 25.0000 mg | ORAL_TABLET | Freq: Every day | ORAL | Status: DC

## 2015-10-30 NOTE — Progress Notes (Signed)
left in my box °

## 2015-10-30 NOTE — Telephone Encounter (Signed)
Added wkly labs and f/u week 2 and week 4 per 7/11 pof. Left message for patient and added comment to appointments notes for medonc/radonc to send patient for new schedule. [place] work on 7/13 pof re mammo.

## 2015-10-31 ENCOUNTER — Other Ambulatory Visit (HOSPITAL_BASED_OUTPATIENT_CLINIC_OR_DEPARTMENT_OTHER): Payer: 59

## 2015-10-31 ENCOUNTER — Other Ambulatory Visit: Payer: Self-pay | Admitting: *Deleted

## 2015-10-31 ENCOUNTER — Encounter: Payer: Self-pay | Admitting: Hematology

## 2015-10-31 ENCOUNTER — Ambulatory Visit
Admission: RE | Admit: 2015-10-31 | Discharge: 2015-10-31 | Disposition: A | Discharge status: Home / Self Care | Payer: 59 | Source: Ambulatory Visit | Attending: Radiation Oncology | Admitting: Radiation Oncology

## 2015-10-31 DIAGNOSIS — D509 Iron deficiency anemia, unspecified: Secondary | ICD-10-CM

## 2015-10-31 DIAGNOSIS — C20 Malignant neoplasm of rectum: Secondary | ICD-10-CM

## 2015-10-31 DIAGNOSIS — Z510 Encounter for antineoplastic radiation therapy: Secondary | ICD-10-CM | POA: Diagnosis not present

## 2015-10-31 LAB — COMPREHENSIVE METABOLIC PANEL
ALBUMIN: 3.7 g/dL (ref 3.5–5.0)
ALT: 12 U/L (ref 0–55)
AST: 13 U/L (ref 5–34)
Alkaline Phosphatase: 68 U/L (ref 40–150)
Anion Gap: 9 mEq/L (ref 3–11)
BILIRUBIN TOTAL: 0.78 mg/dL (ref 0.20–1.20)
BUN: 16.9 mg/dL (ref 7.0–26.0)
CO2: 25 meq/L (ref 22–29)
Calcium: 9.3 mg/dL (ref 8.4–10.4)
Chloride: 105 mEq/L (ref 98–109)
Creatinine: 0.7 mg/dL (ref 0.6–1.1)
GLUCOSE: 172 mg/dL — AB (ref 70–140)
POTASSIUM: 3.6 meq/L (ref 3.5–5.1)
SODIUM: 139 meq/L (ref 136–145)
TOTAL PROTEIN: 6.8 g/dL (ref 6.4–8.3)

## 2015-10-31 LAB — CBC WITH DIFFERENTIAL/PLATELET
BASO%: 0.2 % (ref 0.0–2.0)
Basophils Absolute: 0 10*3/uL (ref 0.0–0.1)
EOS ABS: 0 10*3/uL (ref 0.0–0.5)
EOS%: 0.9 % (ref 0.0–7.0)
HCT: 30.7 % — ABNORMAL LOW (ref 34.8–46.6)
HEMOGLOBIN: 9.7 g/dL — AB (ref 11.6–15.9)
LYMPH%: 19.7 % (ref 14.0–49.7)
MCH: 23.4 pg — ABNORMAL LOW (ref 25.1–34.0)
MCHC: 31.6 g/dL (ref 31.5–36.0)
MCV: 74.2 fL — ABNORMAL LOW (ref 79.5–101.0)
MONO#: 0.2 10*3/uL (ref 0.1–0.9)
MONO%: 4.8 % (ref 0.0–14.0)
NEUT%: 74.4 % (ref 38.4–76.8)
NEUTROS ABS: 3.3 10*3/uL (ref 1.5–6.5)
Platelets: 315 10*3/uL (ref 145–400)
RBC: 4.14 10*6/uL (ref 3.70–5.45)
RDW: 25.6 % — AB (ref 11.2–14.5)
WBC: 4.4 10*3/uL (ref 3.9–10.3)
lymph#: 0.9 10*3/uL (ref 0.9–3.3)

## 2015-10-31 NOTE — Progress Notes (Signed)
left in my box- left for dr Burr Medico to sign

## 2015-11-01 ENCOUNTER — Encounter: Payer: Self-pay | Admitting: Radiation Oncology

## 2015-11-01 ENCOUNTER — Ambulatory Visit
Admission: RE | Admit: 2015-11-01 | Discharge: 2015-11-01 | Disposition: A | Discharge status: Home / Self Care | Payer: 59 | Source: Ambulatory Visit | Attending: Radiation Oncology | Admitting: Radiation Oncology

## 2015-11-01 DIAGNOSIS — Z510 Encounter for antineoplastic radiation therapy: Secondary | ICD-10-CM | POA: Diagnosis not present

## 2015-11-01 NOTE — Progress Notes (Signed)
Sent email to meredith to call hubby ronnie on what he needs for cancer policy. [phone]

## 2015-11-02 ENCOUNTER — Encounter: Payer: Self-pay | Admitting: Hematology

## 2015-11-02 ENCOUNTER — Telehealth: Payer: Self-pay

## 2015-11-02 ENCOUNTER — Ambulatory Visit
Admission: RE | Admit: 2015-11-02 | Discharge: 2015-11-02 | Disposition: A | Discharge status: Home / Self Care | Payer: 59 | Source: Ambulatory Visit | Attending: Radiation Oncology | Admitting: Radiation Oncology

## 2015-11-02 DIAGNOSIS — Z510 Encounter for antineoplastic radiation therapy: Secondary | ICD-10-CM | POA: Diagnosis not present

## 2015-11-02 MED ORDER — HYDROCODONE-ACETAMINOPHEN 5-325 MG PO TABS: 1.0000 | ORAL_TABLET | Freq: Four times a day (QID) | ORAL | Status: DC | PRN

## 2015-11-02 NOTE — Telephone Encounter (Signed)
HYDROcodone-acetaminophen ([place]) 5-325 MG  Patient is requesting a refill on this medication.

## 2015-11-02 NOTE — Progress Notes (Signed)
left in my box- left for dr Burr Medico to sign- hubby wants wife to pick up-don't fax-form ready for pk up by wife-her hubby do not fax.

## 2015-11-02 NOTE — Telephone Encounter (Signed)
Done hardcopy to Corinne  

## 2015-11-03 ENCOUNTER — Encounter: Payer: Self-pay | Admitting: Radiation Oncology

## 2015-11-03 ENCOUNTER — Ambulatory Visit
Admission: RE | Admit: 2015-11-03 | Discharge: 2015-11-03 | Disposition: A | Discharge status: Home / Self Care | Payer: 59 | Source: Ambulatory Visit | Attending: Radiation Oncology | Admitting: Radiation Oncology

## 2015-11-03 VITALS — BP 147/70 | HR 96 | Temp 98.0°F | Ht 65.5 in | Wt 155.7 lb

## 2015-11-03 DIAGNOSIS — C20 Malignant neoplasm of rectum: Secondary | ICD-10-CM

## 2015-11-03 DIAGNOSIS — Z510 Encounter for antineoplastic radiation therapy: Secondary | ICD-10-CM | POA: Diagnosis not present

## 2015-11-03 NOTE — Progress Notes (Signed)
Karen Good has received 8 treatment thus far.  Denies any pain in the rectal area.  Softer stools at this time.

## 2015-11-03 NOTE — Progress Notes (Signed)
Department of Radiation Oncology  Phone:  [phone] Fax:        [phone]  Weekly Treatment Note    Name: Karen Good Date: 11/03/2015 MRN: [id number] DOB: 03/12/1956   Diagnosis:     ICD-9-CM ICD-10-CM   1. Rectal cancer (HCC) 154.1 C20      Current dose: 14.4 Gy  Current fraction: 8   MEDICATIONS: Current Outpatient Prescriptions  Medication Sig Dispense Refill  . amLODipine (NORVASC) 10 MG tablet Take 1 tablet (10 mg total) by mouth daily. 30 tablet 10  . atenolol (TENORMIN) 25 MG tablet Take 0.5 tablets (12.5 mg total) by mouth 2 (two) times daily. 60 tablet 0  . atorvastatin (LIPITOR) 20 MG tablet Take 1 tablet (20 mg total) by mouth daily. 90 tablet 3  . capecitabine (XELODA) 500 MG tablet Take 3 tablets (1,500 mg total) by mouth 2 (two) times daily after a meal. 90 tablet 1  . GLIPIZIDE XL 5 MG 24 hr tablet TAKE TWO TABLETS BY MOUTH DAILY 180 tablet 2  . hydrochlorothiazide (HYDRODIURIL) 25 MG tablet Take 1 tablet (25 mg total) by mouth daily. 30 tablet 5  . HYDROcodone-acetaminophen (NORCO) 5-325 MG tablet Take 1 tablet by mouth every 6 (six) hours as needed for moderate pain. 30 tablet 0  . irbesartan (AVAPRO) 300 MG tablet Take 1 tablet (300 mg total) by mouth at bedtime. 90 tablet 3  . potassium chloride (KLOR-CON M10) 10 MEQ tablet Take 1 tablet (10 mEq total) by mouth daily. 90 tablet 0  . sitaGLIPtin-metformin (JANUMET) 50-1000 MG per tablet Take 1 tablet by mouth 2 (two) times daily. 180 tablet 3  . clonazePAM (KLONOPIN) 0.5 MG tablet Take 1 tablet (0.5 mg total) by mouth 2 (two) times daily as needed (anxiety). (Patient not taking: Reported on 10/11/2015) 30 tablet 0  . ondansetron (ZOFRAN) 8 MG tablet Take 1 tablet (8 mg total) by mouth every 8 (eight) hours as needed for nausea. (Patient not taking: Reported on 11/03/2015) 30 tablet 3  . prochlorperazine (COMPAZINE) 10 MG tablet Take 1 tablet (10 mg total) by mouth every 6 (six) hours as needed for  nausea or vomiting. (Patient not taking: Reported on 11/03/2015) 30 tablet 0  . [DISCONTINUED] nebivolol (BYSTOLIC) 10 MG tablet Take 1 tablet (10 mg total) by mouth daily. 90 tablet 3  . [DISCONTINUED] valsartan (DIOVAN) 320 MG tablet Take 1 tablet (320 mg total) by mouth daily. 90 tablet 3   No current facility-administered medications for this encounter.     ALLERGIES: Review of patient's allergies indicates no known allergies.   LABORATORY DATA:  Lab Results  Component Value Date   WBC 4.4 10/31/2015   HGB 9.7* 10/31/2015   HCT 30.7* 10/31/2015   MCV 74.2* 10/31/2015   PLT 315 10/31/2015   Lab Results  Component Value Date   NA 139 10/31/2015   K 3.6 10/31/2015   CL 110 09/26/2015   CO2 25 10/31/2015   Lab Results  Component Value Date   ALT 12 10/31/2015   AST 13 10/31/2015   ALKPHOS 68 10/31/2015   BILITOT 0.78 10/31/2015     NARRATIVE: Karen Good was seen today for weekly treatment management. The chart was checked and the patient's films were reviewed.  Karen Good has received 8 treatment thus far.  Denies any pain in the rectal area.  Softer stools at this time.    PHYSICAL EXAMINATION: height is 5' 5.5" (1.664 m) and weight is 155 lb 11.2  oz (70.625 kg). Her temperature is 98 F (36.7 C). Her blood pressure is 147/70 and her pulse is 96. Her oxygen saturation is 100%.        ASSESSMENT: The patient is doing satisfactorily with treatment.  PLAN: We will continue with the patient's radiation treatment as planned. The patient continues to do very well.

## 2015-11-03 NOTE — Telephone Encounter (Signed)
Medication placed up front in cabinet. Patient aware

## 2015-11-04 NOTE — Progress Notes (Signed)
  Radiation Oncology         [phone] ________________________________  Name: Karen Good MRN: [id number]  Date: 10/16/2015  DOB: 1955/12/07   SIMULATION AND TREATMENT PLANNING NOTE  DIAGNOSIS:     ICD-9-CM ICD-10-CM   1. Rectal cancer ([place]) 154.1 C20      The patient presented for simulation for the patient's upcoming course of radiation for the diagnosis of rectal cancer. The patient was placed in a supine position. A customized vac-lock bag was constructed to aid in patient immobilization on. This complex treatment device will be used on a daily basis during the treatment. In this fashion a CT scan was obtained through the pelvic region and the isocenter was placed near midline within the pelvis. Surface markings were placed.  The patient's imaging was loaded into the radiation treatment planning system. The patient will initially be planned to receive a course of radiation to a dose of 45 Gy. This will be accomplished in 25 fractions at 1.8 gray per fraction. This initial treatment will correspond to a 3-D conformal technique. The target has been contoured in addition to the rectum, bladder and femoral heads. Dose volume histograms of each of these structures have been requested and these will be carefully reviewed as part of the 3-D conformal treatment planning process. To accomplish this initial treatment, 4 customized blocks have been designed for this purpose. Each of these 4 complex treatment devices will be used on a daily basis during the initial course of the treatment. It is anticipated that the patient will then receive a boost for an additional 5.4 Gy. The anticipated total dose therefore will be 50.4 Gy.    Special treatment procedure The patient will receive chemotherapy during the course of radiation treatment. The patient may experience increased or overlapping toxicity due to this combined-modality approach and the patient will be monitored for such problems. This may  include extra lab work as necessary. This therefore constitutes a special treatment procedure.    ________________________________  Jodelle Gross, MD, PhD

## 2015-11-04 NOTE — Progress Notes (Signed)
  Radiation Oncology         [phone] ________________________________  Name: Karen Good MRN: [id number]  Date: 10/16/2015  DOB: 13-Feb-1956  Optical Surface Tracking Plan:  Since intensity modulated radiotherapy (IMRT) and 3D conformal radiation treatment methods are predicated on accurate and precise positioning for treatment, intrafraction motion monitoring is medically necessary to ensure accurate and safe treatment delivery.  The ability to quantify intrafraction motion without excessive ionizing radiation dose can only be performed with optical surface tracking. Accordingly, surface imaging offers the opportunity to obtain 3D measurements of patient position throughout IMRT and 3D treatments without excessive radiation exposure.  I am ordering optical surface tracking for this patient's upcoming course of radiotherapy. ________________________________  Kyung Rudd, MD 11/04/2015 9:36 AM    Reference:   Particia Jasper, et al. Surface imaging-based analysis of intrafraction motion for breast radiotherapy patients.Journal of [place], n. 6, nov. 2014. ISSN [id number].   Available at: <http://www.jacmp.org/index.php/jacmp/article/view/4957>.

## 2015-11-06 ENCOUNTER — Ambulatory Visit
Admission: RE | Admit: 2015-11-06 | Discharge: 2015-11-06 | Disposition: A | Discharge status: Home / Self Care | Payer: 59 | Source: Ambulatory Visit | Attending: Radiation Oncology | Admitting: Radiation Oncology

## 2015-11-06 DIAGNOSIS — Z510 Encounter for antineoplastic radiation therapy: Secondary | ICD-10-CM | POA: Diagnosis not present

## 2015-11-07 ENCOUNTER — Ambulatory Visit
Admission: RE | Admit: 2015-11-07 | Discharge: 2015-11-07 | Disposition: A | Discharge status: Home / Self Care | Payer: 59 | Source: Ambulatory Visit | Attending: Radiation Oncology | Admitting: Radiation Oncology

## 2015-11-07 ENCOUNTER — Ambulatory Visit (HOSPITAL_BASED_OUTPATIENT_CLINIC_OR_DEPARTMENT_OTHER): Payer: 59 | Admitting: Hematology

## 2015-11-07 ENCOUNTER — Encounter: Payer: Self-pay | Admitting: Hematology

## 2015-11-07 ENCOUNTER — Other Ambulatory Visit (HOSPITAL_BASED_OUTPATIENT_CLINIC_OR_DEPARTMENT_OTHER): Payer: 59

## 2015-11-07 VITALS — BP 169/68 | HR 109 | Temp 98.0°F | Resp 18 | Ht 65.5 in | Wt 153.1 lb

## 2015-11-07 DIAGNOSIS — C20 Malignant neoplasm of rectum: Secondary | ICD-10-CM

## 2015-11-07 DIAGNOSIS — D509 Iron deficiency anemia, unspecified: Secondary | ICD-10-CM

## 2015-11-07 DIAGNOSIS — N63 Unspecified lump in breast: Secondary | ICD-10-CM | POA: Diagnosis not present

## 2015-11-07 DIAGNOSIS — F419 Anxiety disorder, unspecified: Secondary | ICD-10-CM

## 2015-11-07 DIAGNOSIS — D500 Iron deficiency anemia secondary to blood loss (chronic): Secondary | ICD-10-CM

## 2015-11-07 DIAGNOSIS — I10 Essential (primary) hypertension: Secondary | ICD-10-CM

## 2015-11-07 DIAGNOSIS — E119 Type 2 diabetes mellitus without complications: Secondary | ICD-10-CM | POA: Diagnosis not present

## 2015-11-07 DIAGNOSIS — Z510 Encounter for antineoplastic radiation therapy: Secondary | ICD-10-CM | POA: Diagnosis not present

## 2015-11-07 LAB — COMPREHENSIVE METABOLIC PANEL
ALT: 15 U/L (ref 0–55)
ANION GAP: 10 meq/L (ref 3–11)
AST: 14 U/L (ref 5–34)
Albumin: 3.9 g/dL (ref 3.5–5.0)
Alkaline Phosphatase: 65 U/L (ref 40–150)
BUN: 14.6 mg/dL (ref 7.0–26.0)
CALCIUM: 9.7 mg/dL (ref 8.4–10.4)
CHLORIDE: 102 meq/L (ref 98–109)
CO2: 28 meq/L (ref 22–29)
CREATININE: 0.7 mg/dL (ref 0.6–1.1)
EGFR: 88 mL/min/{1.73_m2} — ABNORMAL LOW (ref >90)
Glucose: 130 mg/dl (ref 70–140)
POTASSIUM: 3.8 meq/L (ref 3.5–5.1)
Sodium: 140 mEq/L (ref 136–145)
Total Bilirubin: 0.71 mg/dL (ref 0.20–1.20)
Total Protein: 7.2 g/dL (ref 6.4–8.3)

## 2015-11-07 LAB — CBC WITH DIFFERENTIAL/PLATELET
BASO%: 0.5 % (ref 0.0–2.0)
BASOS ABS: 0 10*3/uL (ref 0.0–0.1)
EOS%: 2 % (ref 0.0–7.0)
Eosinophils Absolute: 0.1 10*3/uL (ref 0.0–0.5)
HCT: 31.9 % — ABNORMAL LOW (ref 34.8–46.6)
HGB: 10.2 g/dL — ABNORMAL LOW (ref 11.6–15.9)
LYMPH%: 18.6 % (ref 14.0–49.7)
MCH: 23.9 pg — AB (ref 25.1–34.0)
MCHC: 31.8 g/dL (ref 31.5–36.0)
MCV: 75.1 fL — ABNORMAL LOW (ref 79.5–101.0)
MONO#: 0.3 10*3/uL (ref 0.1–0.9)
MONO%: 9.3 % (ref 0.0–14.0)
NEUT#: 2 10*3/uL (ref 1.5–6.5)
NEUT%: 69.6 % (ref 38.4–76.8)
PLATELETS: 257 10*3/uL (ref 145–400)
RBC: 4.25 10*6/uL (ref 3.70–5.45)
RDW: 28.9 % — ABNORMAL HIGH (ref 11.2–14.5)
WBC: 2.9 10*3/uL — ABNORMAL LOW (ref 3.9–10.3)
lymph#: 0.5 10*3/uL — ABNORMAL LOW (ref 0.9–3.3)

## 2015-11-07 NOTE — Progress Notes (Signed)
[place]  [phone] [phone]  Clinic Follow up Note   Patient Care Team: Biagio Borg, MD as PCP - General Kyung Rudd, MD as Consulting Physician (Radiation Oncology) Leighton Ruff, MD as Consulting Physician (General Surgery) Milus Banister, MD as Attending Physician (Gastroenterology) 11/07/2015   CHIEF COMPLAINTS:  Follow up rectal cancer  Oncology History   Presented with iron deficiency anemia and hematochezia x 1 month  Rectal cancer [facility])   Staging form: Colon and Rectum, AJCC 7th Edition     Clinical: Stage IIA (T3, N0, M0) - Signed by [name] Merle, MD on 10/10/2015       Rectal cancer ([place])   09/22/2015 Imaging    CT ABD/PELVIS: Soft tissue fullness at the rectosigmoid junction. Otherwise, no acute process in the abdomen or pelvis     09/24/2015 Pathology Results    Adenocarcinoma     09/24/2015 Initial Diagnosis    Rectal cancer ([place])     09/24/2015 Procedure    COLONOSCOPY: Nonobsructive mass in rectosigmoid colon from 10-20 cum, circumferential (Dr. Collene Mares)     09/24/2015 Tumor Marker    CEA=4.1     09/25/2015 Imaging    CT CHEST: Mass within the upper-outer quadrant of the left breast warranting further evaluation. No suspicious pulmonary abnormality    ' HISTORY OF PRESENTING ILLNESS:  Karen Good 60 y.o. female is here because of her newly diagnosed rectal cancer. She presents to my clinic with her husband.  She had had intermittent bloody stool for one month, somewhat mount, mixed with stool, with worsening fatigue, and 6 lbs weight loss, no significant abdominal pain, nausea, dyspnea, or other symptoms. She was seen by her PCP Dr. Jenny Reichmann and lab work showed anemia. She was sent to ED on 6/9 and received blood transfusion for Hb 6.9. She underwent a colonoscopy which showed a partially obstructive proximal rectal mass, biopsy showed adenocarcinoma. She was subsequently referred to Dr. Ardis Hughs and underwent EUS which  showed a T3 N0 rectal mass. She was seen by [place] Dr. Marcello Moores last week, and is also scheduled to see radiation oncologist Dr. Lisbeth Renshaw tomorrow.  She has felt much better overall after blood transfusion. She did receive IV ferric gluconate 125 mg in the hospital, but is not on oral iron supplements. She feels well overall, has good appetite and energy level, but does feel anxious since her cancer diagnosis.  CURRENT THERAPY: Concurrent chemotherapy and irradiation with Xeloda 1500 mg twice daily, starting on 10/25/15  INTERIM HISTORY: [name] returns for follow-up. She is on week 3 of concurrent chemoradiation. She has been tolerating very well, mild fatigue, no other noticeable side effects. She denies any pain, rectal discomfort, diarrhea, or hematochezia. She has good appetite and decent energy level. She still works full-time, her weight is stable.  MEDICAL HISTORY:  Past Medical History:  Diagnosis Date  . BACK PAIN 04/13/2010  . BACK STRAIN, LUMBAR 04/13/2010  . DIABETES MELLITUS, TYPE II 06/02/2008  . HYPERLIPIDEMIA 06/02/2008  . HYPERTENSION 06/02/2008    SURGICAL HISTORY: Past Surgical History:  Procedure Laterality Date  . COLONOSCOPY Left 09/24/2015   Procedure: COLONOSCOPY;  Greggs: Juanita Craver, MD;  Location[facility] ENDOSCOPY;  Service: Endoscopy;  Laterality: Left;  . ESOPHAGOGASTRODUODENOSCOPY N/A 09/24/2015   Procedure: ESOPHAGOGASTRODUODENOSCOPY (EGD);  Hyneman: Juanita Craver, MD;  Location: [facility] ENDOSCOPY;  Service: Endoscopy;  Laterality: N/A;  . EUS N/A 10/05/2015   Procedure: LOWER ENDOSCOPIC ULTRASOUND (EUS);  Tejada: Milus Banister, MD;  Location: [facility] ENDOSCOPY;  Service: Endoscopy;  Laterality: N/A;  . s/p breast biopsy  4/07   benign    SOCIAL HISTORY: Social History   Social History  . Marital status: Single    Spouse name: N/A  . Number of children: 2  . Years of education: N/A   Occupational History  .  [employer]   Social History Main Topics  . Smoking  status: Never Smoker  . Smokeless tobacco: Not on file  . Alcohol use No  . Drug use: No  . Sexual activity: Yes    Birth control/ protection: Post-menopausal   Other Topics Concern  . Not on file   Social History Narrative  . No narrative on file    FAMILY HISTORY: Family History  Problem Relation Age of Onset  . Diabetes Mother   . Heart disease Father   . Diabetes Father   . Hypertension Father   . Diabetes Other   . Diabetes Sister   . Diabetes Brother     ALLERGIES:  has No Known Allergies.  MEDICATIONS:  Current Outpatient Prescriptions  Medication Sig Dispense Refill  . amLODipine (NORVASC) 10 MG tablet Take 1 tablet (10 mg total) by mouth daily. 30 tablet 10  . atenolol (TENORMIN) 25 MG tablet Take 0.5 tablets (12.5 mg total) by mouth 2 (two) times daily. 60 tablet 0  . atorvastatin (LIPITOR) 20 MG tablet Take 1 tablet (20 mg total) by mouth daily. 90 tablet 3  . capecitabine (XELODA) 500 MG tablet Take 3 tablets (1,500 mg total) by mouth 2 (two) times daily after a meal. 90 tablet 1  . clonazePAM (KLONOPIN) 0.5 MG tablet Take 1 tablet (0.5 mg total) by mouth 2 (two) times daily as needed (anxiety). 30 tablet 0  . GLIPIZIDE XL 5 MG 24 hr tablet TAKE TWO TABLETS BY MOUTH DAILY 180 tablet 2  . hydrochlorothiazide (HYDRODIURIL) 25 MG tablet Take 1 tablet (25 mg total) by mouth daily. 30 tablet 5  . HYDROcodone-acetaminophen (NORCO) 5-325 MG tablet Take 1 tablet by mouth every 6 (six) hours as needed for moderate pain. 30 tablet 0  . irbesartan (AVAPRO) 300 MG tablet Take 1 tablet (300 mg total) by mouth at bedtime. 90 tablet 3  . potassium chloride (KLOR-CON M10) 10 MEQ tablet Take 1 tablet (10 mEq total) by mouth daily. 90 tablet 0  . sitaGLIPtin-metformin (JANUMET) 50-1000 MG per tablet Take 1 tablet by mouth 2 (two) times daily. 180 tablet 3  . ondansetron (ZOFRAN) 8 MG tablet Take 1 tablet (8 mg total) by mouth every 8 (eight) hours as needed for nausea. (Patient  not taking: Reported on 11/07/2015) 30 tablet 3  . prochlorperazine (COMPAZINE) 10 MG tablet Take 1 tablet (10 mg total) by mouth every 6 (six) hours as needed for nausea or vomiting. (Patient not taking: Reported on 11/03/2015) 30 tablet 0   No current facility-administered medications for this visit.     REVIEW OF SYSTEMS:   Constitutional: Denies fevers, chills or abnormal night sweats Eyes: Denies blurriness of vision, double vision or watery eyes Ears, nose, mouth, throat, and face: Denies mucositis or sore throat Respiratory: Denies cough, dyspnea or wheezes Cardiovascular: Denies palpitation, chest discomfort or lower extremity swelling Gastrointestinal:  Denies nausea, heartburn or change in bowel habits Skin: Denies abnormal skin rashes Lymphatics: Denies new lymphadenopathy or easy bruising Neurological:Denies numbness, tingling or new weaknesses Behavioral/Psych: Mood is stable, no new changes  All other systems were reviewed with the patient  and are negative.  PHYSICAL EXAMINATION: ECOG PERFORMANCE STATUS: 0 - Asymptomatic  Vitals:   11/07/15 1405  BP: (!) 169/68  Pulse: (!) 109  Resp: 18  Temp: 98 F (36.7 C)   Filed Weights   11/07/15 1405  Weight: 153 lb 1.6 oz (69.4 kg)    GENERAL:alert, no distress and comfortable SKIN: skin color, texture, turgor are normal, no rashes or significant lesions EYES: normal, conjunctiva are pink and non-injected, sclera clear OROPHARYNX:no exudate, no erythema and lips, buccal mucosa, and tongue normal  NECK: supple, thyroid normal size, non-tender, without nodularity LYMPH:  no palpable lymphadenopathy in the cervical, axillary or inguinal LUNGS: clear to auscultation and percussion with normal breathing effort HEART: regular rate & rhythm, 2/6 systolic murmurs, no lower extremity edema ABDOMEN:abdomen soft, non-tender and normal bowel sounds, no organomegaly. Rectal exam was not performed. Musculoskeletal:no cyanosis of  digits and no clubbing  PSYCH: alert & oriented x 3 with fluent speech NEURO: no focal motor/sensory deficits Breast exam: deferred   LABORATORY DATA:  I have reviewed the data as listed CBC Latest Ref Rng & Units 11/07/2015 10/31/2015 10/24/2015  WBC 3.9 - 10.3 10e3/uL 2.9(L) 4.4 6.4  Hemoglobin 11.6 - 15.9 g/dL 10.2(L) 9.7(L) 10.0(L)  Hematocrit 34.8 - 46.6 % 31.9(L) 30.7(L) 32.1(L)  Platelets 145 - 400 10e3/uL 257 315 344   CMP Latest Ref Rng & Units 10/31/2015 10/24/2015 10/11/2015  Glucose 70 - 140 mg/dl 172(H) 119 112  BUN 7.0 - 26.0 mg/dL 16.9 18.6 18.3  Creatinine 0.6 - 1.1 mg/dL 0.7 0.7 0.8  Sodium 136 - 145 mEq/L 139 142 142  Potassium 3.5 - 5.1 mEq/L 3.6 3.9 3.4(L)  Chloride 101 - 111 mmol/L - - -  CO2 22 - 29 mEq/L 25 27 29   Calcium 8.4 - 10.4 mg/dL 9.3 9.9 10.1  Total Protein 6.4 - 8.3 g/dL 6.8 7.2 7.4  Total Bilirubin 0.20 - 1.20 mg/dL 0.78 0.65 0.46  Alkaline Phos 40 - 150 U/L 68 76 78  AST 5 - 34 U/L 13 14 12   ALT 0 - 55 U/L 12 14 13    ANC 2.0 today    PATHOLOGY REPORT  Diagnosis 09/24/2015 Colon, biopsy, Rectal sigmoid mass - ADENOCARCINOMA. - SEE COMMENT. Microscopic Comment Dr. Orene Desanctis has reviewed the case and concurs with this interpretation. Dr. Collene Mares was paged on 09/26/15. (JBK:gt, 09/26/15)   RADIOGRAPHIC STUDIES: I have personally reviewed the radiological images as listed and agreed with the findings in the report. No results found. EUS 10/05/2015 Dr. Ardis Hughs  IMPRESSION:  Partially circumferential, 6cm long uT3N0 rectosigmoid adenocarcinoma with distal edge located 9cm from the anal verge. Submucosal injections of [place]k, at proximal and distal edges of the mass.  Colonoscopy and EGD 09/24/2015 Dr. Collene Mares  - Large circumferental polypoid mass in the rectosigmoid colon from 10-20 cm-biopsies Done. - Normal appearing, widely patent esophagus and GEJ. - Small hiatal hernia noted on retroflexion. - Mild antral gastritis-otherwise normal appearing  stomach. - Normal examined duodenum. - No specimens collected.  ASSESSMENT & PLAN: 60 year old female with past medical history of diabetes and hypertension, presented with rectal bleeding. Colonoscopy showed a large proximal rectal mass.  1. Rectal adenocarcinoma, proximal rectum, uT3N0Mo, stage IIA -I have reviewed her colonoscopy, EUS, and CT of abdomen pelvis finding with pt in details -The EUS showed a T3 lesion, no suspicious lymphadenopathy. The CT scan showed no evidence of adenopathy or distant metastasis. She has stage IIA disease -We reviewed the natural history of rectal cancer  and risk of recurrence after surgery. Given the stage II a disease, she likely has moderate risk of recurrence. We discussed the treatment option for stage IIA rectal cancer. The standard of care is neoadjuvant chemotherapy and radiation, followed by surgery, then adjuvant chemotherapy. -She has been tolerating concurrent radiation and chemotherapy with Xeloda 1500 mg twice daily ver well, no significant side effects so far -We discussed at that side effects more commonly happen at the second part of radiation, especially towards the end of treatment. -Lab results reviewed, she has mild leukocytosis, ANC normal, neutropenia precautions was discussed with patient again today  2. Iron deficient anemia secondary to GI bleeding -Her recent iron study was consistent with iron deficient anemia. -She has received blood transfusion and iv feraheme twice  -I encouraged her to try oral ferrous sulfate 1 tablet a day  3. Anxiety  -Secondary to cancer diagnosis. -Improved lately.  4. HTN and DM -Continue medication. She'll follow-up with her primary care physician -I discussed the impact of chemotherapy on her blood pressure and diabetes, we'll monitor her blood pressure and glucose level closely, and is just the medication if needed. -Her blood pressure has been high lately, I encouraged her to monitor at home and  follow-up with her primary care physician  5. Left breast mass -CT chest revealed a left breast mass, however her screening mammogram was negative a few weeks ago. -I have ordered  diagnostic mammogram and ultrasound of left breast and axilla for further evaluation. It has not been scheduled, will follow   Plan -continue chemoradiation with Xeloda 1500 mg twice daily, last treatment on 8/18 -Lab weekly, I'll see her back in 2 weeks -Left breast diagnostic mammogram and ultrasound in the next few weeks   All questions were answered. The patient knows to call the clinic with any problems, questions or concerns.  I spent 20 minutes counseling the patient face to face. The total time spent in the appointment was 25 minutes and more than 50% was on counseling.     [name] Merle, MD 11/07/2015 2:34 PM

## 2015-11-08 ENCOUNTER — Ambulatory Visit
Admission: RE | Admit: 2015-11-08 | Discharge: 2015-11-08 | Disposition: A | Discharge status: Home / Self Care | Payer: 59 | Source: Ambulatory Visit | Attending: Radiation Oncology | Admitting: Radiation Oncology

## 2015-11-08 DIAGNOSIS — Z510 Encounter for antineoplastic radiation therapy: Secondary | ICD-10-CM | POA: Diagnosis not present

## 2015-11-08 NOTE — Discharge Summary (Signed)
Physician Discharge Summary  Maegen Senft Yanda MRN: [id number] DOB/AGE: 62-18-1957 62 y.o.  PCP: Cathlean Cower, MD   Admit date: 09/22/2015 Discharge date: 11/08/2015  Discharge Diagnoses:    Active Problems:   Lower GI bleed   Hematochezia   Absolute anemia    Follow-up recommendations Follow-up with PCP in 3-5 days , including all  additional recommended appointments as below Follow-up CBC, CMP in 3-5 days   Follow up with Dr Burr Medico in 2 weeks       Discharge Medication List as of 09/26/2015  3:33 PM    START taking these medications   Details  clonazePAM (KLONOPIN) 0.5 MG tablet Take 1 tablet (0.5 mg total) by mouth 2 (two) times daily as needed (anxiety)., Starting 09/26/2015, Until Discontinued, Print      CONTINUE these medications which have CHANGED   Details  atenolol (TENORMIN) 25 MG tablet Take 0.5 tablets (12.5 mg total) by mouth 2 (two) times daily., Starting 09/26/2015, Until Discontinued, Normal    HYDROcodone-acetaminophen (NORCO) 5-325 MG tablet Take 1 tablet by mouth every 6 (six) hours as needed for moderate pain., Starting 09/26/2015, Until Discontinued, Print      CONTINUE these medications which have NOT CHANGED   Details  amLODipine (NORVASC) 10 MG tablet Take 1 tablet (10 mg total) by mouth daily., Starting 07/15/2014, Until Discontinued, Normal    atorvastatin (LIPITOR) 20 MG tablet Take 1 tablet (20 mg total) by mouth daily., Starting 07/19/2014, Until Discontinued, Normal    irbesartan (AVAPRO) 300 MG tablet Take 1 tablet (300 mg total) by mouth at bedtime., Starting 07/19/2014, Until Discontinued, Normal    sitaGLIPtin-metformin (JANUMET) 50-1000 MG per tablet Take 1 tablet by mouth 2 (two) times daily., Starting 08/31/2014, Until Discontinued, Normal    KLOR-CON M10 10 MEQ tablet TAKE 1 TABLET DAILY, Normal    GLIPIZIDE XL 5 MG 24 hr tablet TAKE TWO TABLETS BY MOUTH DAILY, Normal      STOP taking these medications     aspirin 81 MG tablet       hydrochlorothiazide (HYDRODIURIL) 25 MG tablet      ibuprofen (ADVIL,MOTRIN) 200 MG tablet      pioglitazone (ACTOS) 45 MG tablet          Discharge Condition:   Discharge Instructions Get Medicines reviewed and adjusted: Please take all your medications with you for your next visit with your Primary MD  Please request your Primary MD to go over all hospital tests and procedure/radiological results at the follow up, please ask your Primary MD to get all Hospital records sent to his/her office.  If you experience worsening of your admission symptoms, develop shortness of breath, life threatening emergency, suicidal or homicidal thoughts you must seek medical attention immediately by calling 911 or calling your MD immediately if symptoms less severe.  You must read complete instructions/literature along with all the possible adverse reactions/side effects for all the Medicines you take and that have been prescribed to you. Take any new Medicines after you have completely understood and accpet all the possible adverse reactions/side effects.   Do not drive when taking Pain medications.   Do not take more than prescribed Pain, Sleep and Anxiety Medications  Special Instructions: If you have smoked or chewed Tobacco in the last 2 yrs please stop smoking, stop any regular Alcohol and or any Recreational drug use.  Wear Seat belts while driving.  Please note  You were cared for by a hospitalist during your hospital stay. Once  you are discharged, your primary care physician will handle any further medical issues. Please note that NO REFILLS for any discharge medications will be authorized once you are discharged, as it is imperative that you return to your primary care physician (or establish a relationship with a primary care physician if you do not have one) for your aftercare needs so that they can reassess your need for medications and monitor your lab values.  Discharge Instructions     Diet - low sodium heart healthy    Complete by:  As directed   Increase activity slowly    Complete by:  As directed       No Known Allergies    Disposition: 01-Home or Self Care   Consults: *   CCS       Filed Weights   09/22/15 1624  Weight: 71.7 kg (158 lb)     Microbiology: No results found for this or any previous visit (from the past 240 hour(s)).     Blood Culture No results found for: SDES, SPECREQUEST, CULT, REPTSTATUS    Labs: No results found for this or any previous visit (from the past 48 hour(s)).   Lipid Panel     Component Value Date/Time   CHOL 157 09/22/2015 1142   TRIG 78.0 09/22/2015 1142   HDL 48.60 09/22/2015 1142   CHOLHDL 3 09/22/2015 1142   VLDL 15.6 09/22/2015 1142   LDLCALC 93 09/22/2015 1142     Lab Results  Component Value Date   HGBA1C 5.9 09/22/2015   HGBA1C 10.0 (H) 07/11/2014   HGBA1C 10.3 (H) 01/26/2013     Lab Results  Component Value Date   MICROALBUR 8.3 (H) 09/22/2015   LDLCALC 93 09/22/2015   CREATININE 0.7 11/07/2015     60 yo female with history of HTN,DMII who came with cc of hematochezia for almost a month with recurrent red blood per rectum without pain. No melena. No hematemesis or hematuria or hemoptysis.she went for her regular checkup and was found to be anemic with a hemoglobin of 6.9.She's noticed bright red blood per rectum with normally formed stool over the course of one month.she has never had a colonoscopy. Patient has ibuprofen which she takes for muscular skeletal pain, she takes this daily 3x 200 mg tabs in the a.m. and 3 in the PM. This is the only NSAID that she takes, she does not drink alcohol. No easy bruising/bleeding/night sweats/unintentional weight loss. CT abdomen pelvis shows fullness in the rectosigmoid junction concerning for polyp versus carcinoma. Dr. Collene Mares from gastroenterology has been consulted. Patient is status post receiving 1 unit of packed red blood  cells  Hospital course  Microcytic anemia/rectosigmoid mass Patient s/p  EGD and colonoscopy ,She underwent colonoscopy 6/11 reportedly showing a rectal mass. Dr. Collene Mares asked surgery to see her in consultation regarding surgical management Status post receiving IV iron, 2 units of packed red blood cells, hemoglobin 9.3 CEA 4.1 , results of biopsy confirmed adenocarcinoma Patient advance to soft diet CT chest negative for metastatic disease but shows a left breast mass Patient to be seen by Dr. Burr Medico in two weeks, radiation oncology Dr. Lisbeth Renshaw  Breast mass-patient found to have a mass in the upper outer quadrant of the left breast. Patient aware   Hypertension -stable, continue Norvasc , restart low-dose atenolol , holding HCTZ, Avapro,  Dyslipidemia-stable, continue Lipitor  Diabetes mellitus , holding   janumet,  , continue sliding scale insulin , hemoglobin A1c 5.9. Restart glipizide  Discharge Exam:   Blood pressure (!) 145/70, pulse 83, temperature 98.1 F (36.7 C), resp. rate 16, height 5\' 5"  (1.651 m), weight 71.7 kg (158 lb), SpO2 97 %.      Follow-up Information    Cathlean Cower, MD. Schedule an appointment as soon as possible for a visit in 3 days.   Specialties:  Internal Medicine, Radiology Why:  Hospital follow-up, arrange for mammography Contact information: [place] [address] [phone]        [name] Merle, MD. Schedule an appointment as soon as possible for a visit in 2 weeks.   Specialties:  Hematology, Oncology Why:  Please call to ensure the time and date a follow-up appointment Contact information: [place] [address] [phone]        Kyung Rudd, MD. Schedule an appointment as soon as possible for a visit in 1 week.   Specialty:  Radiation Oncology Contact information: [id number] [place] [address] [phone]           Signed: Reyne Dumas 11/08/2015, 3:59 PM        Time  spent >45 mins

## 2015-11-09 ENCOUNTER — Other Ambulatory Visit: Payer: Self-pay | Admitting: Hematology

## 2015-11-09 ENCOUNTER — Ambulatory Visit
Admission: RE | Admit: 2015-11-09 | Discharge: 2015-11-09 | Disposition: A | Discharge status: Home / Self Care | Payer: 59 | Source: Ambulatory Visit | Attending: Radiation Oncology | Admitting: Radiation Oncology

## 2015-11-09 DIAGNOSIS — Z510 Encounter for antineoplastic radiation therapy: Secondary | ICD-10-CM | POA: Diagnosis not present

## 2015-11-09 DIAGNOSIS — N6320 Unspecified lump in the left breast, unspecified quadrant: Secondary | ICD-10-CM

## 2015-11-10 ENCOUNTER — Encounter: Payer: Self-pay | Admitting: Radiation Oncology

## 2015-11-10 ENCOUNTER — Ambulatory Visit
Admission: RE | Admit: 2015-11-10 | Discharge: 2015-11-10 | Disposition: A | Discharge status: Home / Self Care | Payer: 59 | Source: Ambulatory Visit | Attending: Radiation Oncology | Admitting: Radiation Oncology

## 2015-11-10 VITALS — BP 133/65 | HR 98 | Temp 98.2°F | Ht 65.5 in | Wt 155.9 lb

## 2015-11-10 DIAGNOSIS — C20 Malignant neoplasm of rectum: Secondary | ICD-10-CM

## 2015-11-10 DIAGNOSIS — Z510 Encounter for antineoplastic radiation therapy: Secondary | ICD-10-CM | POA: Diagnosis not present

## 2015-11-10 NOTE — Progress Notes (Signed)
Department of Radiation Oncology  Phone:  [phone] Fax:        [phone]  Weekly Treatment Note    Name: Karen Good Date: 11/10/2015 MRN: [id number] DOB: 1955-06-21   Diagnosis:     ICD-9-CM ICD-10-CM   1. Rectal cancer (HCC) 154.1 C20      Current dose: 23.4 Gy  Current fraction: 8   MEDICATIONS: Current Outpatient Prescriptions  Medication Sig Dispense Refill  . amLODipine (NORVASC) 10 MG tablet Take 1 tablet (10 mg total) by mouth daily. 30 tablet 10  . atenolol (TENORMIN) 25 MG tablet Take 0.5 tablets (12.5 mg total) by mouth 2 (two) times daily. 60 tablet 0  . atorvastatin (LIPITOR) 20 MG tablet Take 1 tablet (20 mg total) by mouth daily. 90 tablet 3  . capecitabine (XELODA) 500 MG tablet Take 3 tablets (1,500 mg total) by mouth 2 (two) times daily after a meal. 90 tablet 1  . GLIPIZIDE XL 5 MG 24 hr tablet TAKE TWO TABLETS BY MOUTH DAILY 180 tablet 2  . hydrochlorothiazide (HYDRODIURIL) 25 MG tablet Take 1 tablet (25 mg total) by mouth daily. 30 tablet 5  . HYDROcodone-acetaminophen (NORCO) 5-325 MG tablet Take 1 tablet by mouth every 6 (six) hours as needed for moderate pain. 30 tablet 0  . irbesartan (AVAPRO) 300 MG tablet Take 1 tablet (300 mg total) by mouth at bedtime. 90 tablet 3  . potassium chloride (KLOR-CON M10) 10 MEQ tablet Take 1 tablet (10 mEq total) by mouth daily. 90 tablet 0  . sitaGLIPtin-metformin (JANUMET) 50-1000 MG per tablet Take 1 tablet by mouth 2 (two) times daily. 180 tablet 3  . clonazePAM (KLONOPIN) 0.5 MG tablet Take 1 tablet (0.5 mg total) by mouth 2 (two) times daily as needed (anxiety). (Patient not taking: Reported on 11/10/2015) 30 tablet 0  . ondansetron (ZOFRAN) 8 MG tablet Take 1 tablet (8 mg total) by mouth every 8 (eight) hours as needed for nausea. (Patient not taking: Reported on 11/07/2015) 30 tablet 3  . prochlorperazine (COMPAZINE) 10 MG tablet Take 1 tablet (10 mg total) by mouth every 6 (six) hours as needed for  nausea or vomiting. (Patient not taking: Reported on 11/03/2015) 30 tablet 0   No current facility-administered medications for this encounter.      ALLERGIES: Review of patient's allergies indicates no known allergies.   LABORATORY DATA:  Lab Results  Component Value Date   WBC 2.9 (L) 11/07/2015   HGB 10.2 (L) 11/07/2015   HCT 31.9 (L) 11/07/2015   MCV 75.1 (L) 11/07/2015   PLT 257 11/07/2015   Lab Results  Component Value Date   NA 140 11/07/2015   K 3.8 11/07/2015   CL 110 09/26/2015   CO2 28 11/07/2015   Lab Results  Component Value Date   ALT 15 11/07/2015   AST 14 11/07/2015   ALKPHOS 65 11/07/2015   BILITOT 0.71 11/07/2015     NARRATIVE: Karen Good was seen today for weekly treatment management. The chart was checked and the patient's films were reviewed.  Takyia has completed 13 fractions to her rectum.  She denies having pain.  She is taking Xeloda and denies having nausea.  She had diarrhea yesterday twice due to eating a fresh tomato.  She had not started taking Imodium yet.  She reports having occasional small amount of rectal bleeding after bowel movements.  She denies having any skin irritation or fatigue.     PHYSICAL EXAMINATION: height is 5' 5.5" (  1.664 m) and weight is 155 lb 14.4 oz (70.7 kg). Her oral temperature is 98.2 F (36.8 C). Her blood pressure is 133/65 and her pulse is 98. Her oxygen saturation is 100%.        ASSESSMENT: The patient is doing satisfactorily with treatment.  PLAN: We will continue with the patient's radiation treatment as planned. I instructed the patient that she could take Imodium to treat her diarrhea     ------------------------------------------------   Karen Pita, MD [place] Director and Director of Stereotactic Radiosurgery Direct Dial: [phone]  Fax: [phone] [email or web address]  Skype  LinkedIn    This document serves as a record of services personally  performed by Karen Pita, MD. It was created on his behalf by Truddie Hidden, a trained medical scribe. The creation of this record is based on the scribe's personal observations and the provider's statements to them. This document has been checked and approved by the attending provider.

## 2015-11-10 NOTE — Progress Notes (Addendum)
Karen Good has completed 13 fractions to her rectum.  She denies having pain.  She is taking Xeloda and denies having nausea.  She had diarrhea yesterday twice due to eating a fresh tomato.  She had not started taking Imodium yet.  She reports having occasional small amount of rectal bleeding after bowel movements.  She denies having any skin irritation or fatigue.   BP 133/65 (BP Location: Left Arm, Patient Position: Sitting)   Pulse 98   Temp 98.2 F (36.8 C) (Oral)   Ht 5' 5.5" (1.664 m)   Wt 155 lb 14.4 oz (70.7 kg)   SpO2 100%   BMI 25.55 kg/m    Wt Readings from Last 3 Encounters:  11/10/15 155 lb 14.4 oz (70.7 kg)  11/07/15 153 lb 1.6 oz (69.4 kg)  11/03/15 155 lb 11.2 oz (70.6 kg)

## 2015-11-13 ENCOUNTER — Telehealth: Payer: Self-pay | Admitting: Hematology

## 2015-11-13 ENCOUNTER — Ambulatory Visit
Admission: RE | Admit: 2015-11-13 | Discharge: 2015-11-13 | Disposition: A | Discharge status: Home / Self Care | Payer: 59 | Source: Ambulatory Visit | Attending: Radiation Oncology | Admitting: Radiation Oncology

## 2015-11-13 DIAGNOSIS — Z510 Encounter for antineoplastic radiation therapy: Secondary | ICD-10-CM | POA: Diagnosis not present

## 2015-11-13 MED FILL — CAPECITABINE 500 MG TABLET: 500 | 15 days supply | Qty: 90 | Fill #1

## 2015-11-13 NOTE — Telephone Encounter (Signed)
cld pt and left message of mammo appt@ [facility] on 8/7@1 :10. Adv she is responsible for getting film/images from [facility] from last mammo and pick them up or have sent to [facility] brfore appt-if not recieved b4 then or have them for appt-they will CX appt

## 2015-11-14 ENCOUNTER — Ambulatory Visit
Admission: RE | Admit: 2015-11-14 | Discharge: 2015-11-14 | Disposition: A | Discharge status: Home / Self Care | Payer: 59 | Source: Ambulatory Visit | Attending: Radiation Oncology | Admitting: Radiation Oncology

## 2015-11-14 ENCOUNTER — Other Ambulatory Visit (HOSPITAL_BASED_OUTPATIENT_CLINIC_OR_DEPARTMENT_OTHER): Payer: 59

## 2015-11-14 DIAGNOSIS — Z510 Encounter for antineoplastic radiation therapy: Secondary | ICD-10-CM | POA: Diagnosis not present

## 2015-11-14 DIAGNOSIS — C20 Malignant neoplasm of rectum: Secondary | ICD-10-CM | POA: Diagnosis not present

## 2015-11-14 LAB — CBC WITH DIFFERENTIAL/PLATELET
BASO%: 0.3 % (ref 0.0–2.0)
BASOS ABS: 0 10*3/uL (ref 0.0–0.1)
EOS%: 1.8 % (ref 0.0–7.0)
Eosinophils Absolute: 0.1 10*3/uL (ref 0.0–0.5)
HCT: 31.3 % — ABNORMAL LOW (ref 34.8–46.6)
HGB: 9.8 g/dL — ABNORMAL LOW (ref 11.6–15.9)
LYMPH%: 12.6 % — AB (ref 14.0–49.7)
MCH: 24 pg — AB (ref 25.1–34.0)
MCHC: 31.3 g/dL — AB (ref 31.5–36.0)
MCV: 76.7 fL — ABNORMAL LOW (ref 79.5–101.0)
MONO#: 0.4 10*3/uL (ref 0.1–0.9)
MONO%: 11.2 % (ref 0.0–14.0)
NEUT#: 2.4 10*3/uL (ref 1.5–6.5)
NEUT%: 74.1 % (ref 38.4–76.8)
PLATELETS: 247 10*3/uL (ref 145–400)
RBC: 4.08 10*6/uL (ref 3.70–5.45)
RDW: 30.9 % — ABNORMAL HIGH (ref 11.2–14.5)
WBC: 3.2 10*3/uL — ABNORMAL LOW (ref 3.9–10.3)
lymph#: 0.4 10*3/uL — ABNORMAL LOW (ref 0.9–3.3)

## 2015-11-14 LAB — COMPREHENSIVE METABOLIC PANEL
ALT: 15 U/L (ref 0–55)
ANION GAP: 10 meq/L (ref 3–11)
AST: 14 U/L (ref 5–34)
Albumin: 3.8 g/dL (ref 3.5–5.0)
Alkaline Phosphatase: 59 U/L (ref 40–150)
BUN: 19 mg/dL (ref 7.0–26.0)
CALCIUM: 9.6 mg/dL (ref 8.4–10.4)
CHLORIDE: 103 meq/L (ref 98–109)
CO2: 27 meq/L (ref 22–29)
CREATININE: 0.7 mg/dL (ref 0.6–1.1)
EGFR: 88 mL/min/{1.73_m2} — ABNORMAL LOW (ref >90)
Glucose: 89 mg/dl (ref 70–140)
POTASSIUM: 3.4 meq/L — AB (ref 3.5–5.1)
Sodium: 140 mEq/L (ref 136–145)
Total Bilirubin: 0.65 mg/dL (ref 0.20–1.20)
Total Protein: 7 g/dL (ref 6.4–8.3)

## 2015-11-15 ENCOUNTER — Ambulatory Visit
Admission: RE | Admit: 2015-11-15 | Discharge: 2015-11-15 | Disposition: A | Discharge status: Home / Self Care | Payer: 59 | Source: Ambulatory Visit | Attending: Radiation Oncology | Admitting: Radiation Oncology

## 2015-11-15 DIAGNOSIS — Z510 Encounter for antineoplastic radiation therapy: Secondary | ICD-10-CM | POA: Diagnosis not present

## 2015-11-16 ENCOUNTER — Ambulatory Visit
Admission: RE | Admit: 2015-11-16 | Discharge: 2015-11-16 | Disposition: A | Discharge status: Home / Self Care | Payer: 59 | Source: Ambulatory Visit | Attending: Radiation Oncology | Admitting: Radiation Oncology

## 2015-11-16 DIAGNOSIS — Z510 Encounter for antineoplastic radiation therapy: Secondary | ICD-10-CM | POA: Diagnosis not present

## 2015-11-17 ENCOUNTER — Ambulatory Visit
Admission: RE | Admit: 2015-11-17 | Discharge: 2015-11-17 | Disposition: A | Discharge status: Home / Self Care | Payer: 59 | Source: Ambulatory Visit | Attending: Radiation Oncology | Admitting: Radiation Oncology

## 2015-11-17 ENCOUNTER — Encounter: Payer: Self-pay | Admitting: Radiation Oncology

## 2015-11-17 VITALS — BP 143/65 | HR 105 | Temp 97.6°F | Resp 20 | Wt 156.3 lb

## 2015-11-17 DIAGNOSIS — C20 Malignant neoplasm of rectum: Secondary | ICD-10-CM

## 2015-11-17 DIAGNOSIS — Z510 Encounter for antineoplastic radiation therapy: Secondary | ICD-10-CM | POA: Diagnosis not present

## 2015-11-17 NOTE — Progress Notes (Signed)
Weekly rad txs rectum 18/28  Completd, slight discomfort folds of groin, skin intact, slight itchiness at times bottom, soft stools daily, no nausea, no dysuria, appetite good,  1:52 PM Wt Readings from Last 3 Encounters:  11/17/15 156 lb 4.8 oz (70.9 kg)  11/10/15 155 lb 14.4 oz (70.7 kg)  11/07/15 153 lb 1.6 oz (69.4 kg)

## 2015-11-17 NOTE — Progress Notes (Signed)
Department of Radiation Oncology  Phone:  [phone] Fax:        [phone]  Weekly Treatment Note    Name: Karen Good Date: 11/17/2015 MRN: [id number] DOB: 1956-01-25   Diagnosis:     ICD-9-CM ICD-10-CM   1. Rectal cancer (HCC) 154.1 C20      Current dose: 32.4 Gy  Current fraction:18   MEDICATIONS: Current Outpatient Prescriptions  Medication Sig Dispense Refill  . amLODipine (NORVASC) 10 MG tablet Take 1 tablet (10 mg total) by mouth daily. 30 tablet 10  . atenolol (TENORMIN) 25 MG tablet Take 0.5 tablets (12.5 mg total) by mouth 2 (two) times daily. 60 tablet 0  . atorvastatin (LIPITOR) 20 MG tablet Take 1 tablet (20 mg total) by mouth daily. 90 tablet 3  . capecitabine (XELODA) 500 MG tablet Take 3 tablets (1,500 mg total) by mouth 2 (two) times daily after a meal. 90 tablet 1  . GLIPIZIDE XL 5 MG 24 hr tablet TAKE TWO TABLETS BY MOUTH DAILY 180 tablet 2  . hydrochlorothiazide (HYDRODIURIL) 25 MG tablet Take 1 tablet (25 mg total) by mouth daily. 30 tablet 5  . HYDROcodone-acetaminophen (NORCO) 5-325 MG tablet Take 1 tablet by mouth every 6 (six) hours as needed for moderate pain. 30 tablet 0  . irbesartan (AVAPRO) 300 MG tablet Take 1 tablet (300 mg total) by mouth at bedtime. 90 tablet 3  . potassium chloride (KLOR-CON M10) 10 MEQ tablet Take 1 tablet (10 mEq total) by mouth daily. 90 tablet 0  . sitaGLIPtin-metformin (JANUMET) 50-1000 MG per tablet Take 1 tablet by mouth 2 (two) times daily. 180 tablet 3  . clonazePAM (KLONOPIN) 0.5 MG tablet Take 1 tablet (0.5 mg total) by mouth 2 (two) times daily as needed (anxiety). (Patient not taking: Reported on 11/10/2015) 30 tablet 0  . ondansetron (ZOFRAN) 8 MG tablet Take 1 tablet (8 mg total) by mouth every 8 (eight) hours as needed for nausea. (Patient not taking: Reported on 11/07/2015) 30 tablet 3  . prochlorperazine (COMPAZINE) 10 MG tablet Take 1 tablet (10 mg total) by mouth every 6 (six) hours as needed for  nausea or vomiting. (Patient not taking: Reported on 11/03/2015) 30 tablet 0   No current facility-administered medications for this encounter.      ALLERGIES: Review of patient's allergies indicates no known allergies.   LABORATORY DATA:  Lab Results  Component Value Date   WBC 3.2 (L) 11/14/2015   HGB 9.8 (L) 11/14/2015   HCT 31.3 (L) 11/14/2015   MCV 76.7 (L) 11/14/2015   PLT 247 11/14/2015   Lab Results  Component Value Date   NA 140 11/14/2015   K 3.4 (L) 11/14/2015   CL 110 09/26/2015   CO2 27 11/14/2015   Lab Results  Component Value Date   ALT 15 11/14/2015   AST 14 11/14/2015   ALKPHOS 59 11/14/2015   BILITOT 0.65 11/14/2015     NARRATIVE: [name] Luiz was seen today for weekly treatment management. The chart was checked and the patient's films were reviewed.  Weekly rad txs rectum 18/28  Completd, slight discomfort folds of groin, skin intact, slight itchiness at times bottom, soft stools daily, no nausea, no dysuria, appetite good,  2:11 PM Wt Readings from Last 3 Encounters:  11/17/15 156 lb 4.8 oz (70.9 kg)  11/10/15 155 lb 14.4 oz (70.7 kg)  11/07/15 153 lb 1.6 oz (69.4 kg)    PHYSICAL EXAMINATION: weight is 156 lb 4.8 oz (70.9 kg).  Her oral temperature is 97.6 F (36.4 C). Her blood pressure is 143/65 (abnormal) and her pulse is 105 (abnormal). Her respiration is 20.        ASSESSMENT: The patient is doing satisfactorily with treatment.  PLAN: We will continue with the patient's radiation treatment as planned.

## 2015-11-20 ENCOUNTER — Ambulatory Visit
Admission: RE | Admit: 2015-11-20 | Discharge: 2015-11-20 | Disposition: A | Discharge status: Home / Self Care | Payer: 59 | Source: Ambulatory Visit | Attending: Radiation Oncology | Admitting: Radiation Oncology

## 2015-11-20 ENCOUNTER — Ambulatory Visit
Admission: RE | Admit: 2015-11-20 | Discharge: 2015-11-20 | Disposition: A | Discharge status: Home / Self Care | Payer: 59 | Source: Ambulatory Visit | Attending: Hematology | Admitting: Hematology

## 2015-11-20 ENCOUNTER — Encounter: Payer: Self-pay | Admitting: Radiation Oncology

## 2015-11-20 ENCOUNTER — Other Ambulatory Visit: Payer: Self-pay | Admitting: Hematology

## 2015-11-20 DIAGNOSIS — Z510 Encounter for antineoplastic radiation therapy: Secondary | ICD-10-CM | POA: Diagnosis not present

## 2015-11-20 DIAGNOSIS — N6320 Unspecified lump in the left breast, unspecified quadrant: Secondary | ICD-10-CM

## 2015-11-21 ENCOUNTER — Ambulatory Visit (HOSPITAL_BASED_OUTPATIENT_CLINIC_OR_DEPARTMENT_OTHER): Payer: 59 | Admitting: Hematology

## 2015-11-21 ENCOUNTER — Ambulatory Visit
Admission: RE | Admit: 2015-11-21 | Discharge: 2015-11-21 | Disposition: A | Discharge status: Home / Self Care | Payer: 59 | Source: Ambulatory Visit | Attending: Radiation Oncology | Admitting: Radiation Oncology

## 2015-11-21 ENCOUNTER — Encounter: Payer: Self-pay | Admitting: Hematology

## 2015-11-21 ENCOUNTER — Other Ambulatory Visit: Payer: Self-pay | Admitting: *Deleted

## 2015-11-21 ENCOUNTER — Other Ambulatory Visit (HOSPITAL_BASED_OUTPATIENT_CLINIC_OR_DEPARTMENT_OTHER): Payer: 59

## 2015-11-21 VITALS — BP 149/61 | HR 104 | Temp 97.8°F | Resp 18 | Ht 65.5 in | Wt 157.4 lb

## 2015-11-21 DIAGNOSIS — C20 Malignant neoplasm of rectum: Secondary | ICD-10-CM

## 2015-11-21 DIAGNOSIS — Z510 Encounter for antineoplastic radiation therapy: Secondary | ICD-10-CM | POA: Diagnosis not present

## 2015-11-21 DIAGNOSIS — D500 Iron deficiency anemia secondary to blood loss (chronic): Secondary | ICD-10-CM

## 2015-11-21 DIAGNOSIS — E119 Type 2 diabetes mellitus without complications: Secondary | ICD-10-CM

## 2015-11-21 DIAGNOSIS — I10 Essential (primary) hypertension: Secondary | ICD-10-CM

## 2015-11-21 DIAGNOSIS — N63 Unspecified lump in breast: Secondary | ICD-10-CM | POA: Diagnosis not present

## 2015-11-21 DIAGNOSIS — D509 Iron deficiency anemia, unspecified: Secondary | ICD-10-CM

## 2015-11-21 DIAGNOSIS — F419 Anxiety disorder, unspecified: Secondary | ICD-10-CM

## 2015-11-21 LAB — COMPREHENSIVE METABOLIC PANEL
ALT: 12 U/L (ref 0–55)
AST: 13 U/L (ref 5–34)
Albumin: 4.1 g/dL (ref 3.5–5.0)
Alkaline Phosphatase: 62 U/L (ref 40–150)
Anion Gap: 10 mEq/L (ref 3–11)
BUN: 14.4 mg/dL (ref 7.0–26.0)
CHLORIDE: 102 meq/L (ref 98–109)
CO2: 30 meq/L — AB (ref 22–29)
CREATININE: 0.8 mg/dL (ref 0.6–1.1)
Calcium: 10 mg/dL (ref 8.4–10.4)
EGFR: 81 mL/min/{1.73_m2} — ABNORMAL LOW (ref >90)
GLUCOSE: 127 mg/dL (ref 70–140)
POTASSIUM: 3.1 meq/L — AB (ref 3.5–5.1)
SODIUM: 142 meq/L (ref 136–145)
Total Bilirubin: 0.75 mg/dL (ref 0.20–1.20)
Total Protein: 7.4 g/dL (ref 6.4–8.3)

## 2015-11-21 LAB — CBC WITH DIFFERENTIAL/PLATELET
BASO%: 0.3 % (ref 0.0–2.0)
Basophils Absolute: 0 10*3/uL (ref 0.0–0.1)
EOS%: 1.6 % (ref 0.0–7.0)
Eosinophils Absolute: 0 10*3/uL (ref 0.0–0.5)
HEMATOCRIT: 32.3 % — AB (ref 34.8–46.6)
HGB: 10.2 g/dL — ABNORMAL LOW (ref 11.6–15.9)
LYMPH#: 0.2 10*3/uL — AB (ref 0.9–3.3)
LYMPH%: 8.9 % — AB (ref 14.0–49.7)
MCH: 24.5 pg — ABNORMAL LOW (ref 25.1–34.0)
MCHC: 31.4 g/dL — AB (ref 31.5–36.0)
MCV: 78.1 fL — ABNORMAL LOW (ref 79.5–101.0)
MONO#: 0.3 10*3/uL (ref 0.1–0.9)
MONO%: 12.3 % (ref 0.0–14.0)
NEUT#: 1.8 10*3/uL (ref 1.5–6.5)
NEUT%: 76.9 % — AB (ref 38.4–76.8)
Platelets: 291 10*3/uL (ref 145–400)
RBC: 4.14 10*6/uL (ref 3.70–5.45)
RDW: 32.2 % — ABNORMAL HIGH (ref 11.2–14.5)
WBC: 2.4 10*3/uL — ABNORMAL LOW (ref 3.9–10.3)

## 2015-11-21 MED ORDER — POTASSIUM CHLORIDE CRYS ER 20 MEQ PO TBCR: 20.0000 meq | EXTENDED_RELEASE_TABLET | Freq: Every day | ORAL | 0 refills | Status: DC

## 2015-11-21 NOTE — Progress Notes (Signed)
[name]  [phone] [phone]  Clinic Follow up Note   Patient Care Team: Biagio Borg, MD as PCP - General Kyung Rudd, MD as Consulting Physician (Radiation Oncology) Leighton Ruff, MD as Consulting Physician (General Surgery) Milus Banister, MD as Attending Physician (Gastroenterology) 11/21/2015   CHIEF COMPLAINTS:  Follow up rectal cancer  Oncology History   Presented with iron deficiency anemia and hematochezia x 1 month  Rectal cancer [facility])   Staging form: Colon and Rectum, AJCC 7th Edition     Clinical: Stage IIA (T3, N0, M0) - Signed by [name] Merle, MD on 10/10/2015       Rectal cancer ([place])   09/22/2015 Imaging    CT ABD/PELVIS: Soft tissue fullness at the rectosigmoid junction. Otherwise, no acute process in the abdomen or pelvis     09/24/2015 Pathology Results    Adenocarcinoma     09/24/2015 Initial Diagnosis    Rectal cancer ([place])     09/24/2015 Procedure    COLONOSCOPY: Nonobsructive mass in rectosigmoid colon from 10-20 cum, circumferential (Dr. Collene Mares)     09/24/2015 Tumor Marker    CEA=4.1     09/25/2015 Imaging    CT CHEST: Mass within the upper-outer quadrant of the left breast warranting further evaluation. No suspicious pulmonary abnormality     10/25/2015 -  Radiation Therapy    Your adjuvant irradiation to rectal cancer     10/25/2015 -  Chemotherapy    Xeloda 1500 mg twice daily, with concurrent irradiation    ' HISTORY OF PRESENTING ILLNESS:  Karen Good 60 y.o. female is here because of her newly diagnosed rectal cancer. She presents to my clinic with her husband.  She had had intermittent bloody stool for one month, somewhat mount, mixed with stool, with worsening fatigue, and 6 lbs weight loss, no significant abdominal pain, nausea, dyspnea, or other symptoms. She was seen by her PCP Dr. Jenny Reichmann and lab work showed anemia. She was sent to ED on 6/9 and received blood transfusion for Hb 6.9. She underwent  a colonoscopy which showed a partially obstructive proximal rectal mass, biopsy showed adenocarcinoma. She was subsequently referred to Dr. Ardis Hughs and underwent EUS which showed a T3 N0 rectal mass. She was seen by [place] Dr. Marcello Moores last week, and is also scheduled to see radiation oncologist Dr. Lisbeth Renshaw tomorrow.  She has felt much better overall after blood transfusion. She did receive IV ferric gluconate 125 mg in the hospital, but is not on oral iron supplements. She feels well overall, has good appetite and energy level, but does feel anxious since her cancer diagnosis.  CURRENT THERAPY: Concurrent chemotherapy and irradiation with Xeloda 1500 mg twice daily, started on 10/25/15  INTERIM HISTORY: Karen Good returns for follow-up. She is on week 5 of concurrent chemoradiation. She is doing well overall, tolerating team and radiation very well. She denies significant rectal pain, bleeding, or other discomfort. No nausea or diarrhea. Her bowel movements normal. She is finishing treatment next week. No other new complaints.  MEDICAL HISTORY:  Past Medical History:  Diagnosis Date  . BACK PAIN 04/13/2010  . BACK STRAIN, LUMBAR 04/13/2010  . DIABETES MELLITUS, TYPE II 06/02/2008  . HYPERLIPIDEMIA 06/02/2008  . HYPERTENSION 06/02/2008    SURGICAL HISTORY: Past Surgical History:  Procedure Laterality Date  . COLONOSCOPY Left 09/24/2015   Procedure: COLONOSCOPY;  [place]: Juanita Craver, MD;  Location: [facility] [place] ENDOSCOPY;  Service: Endoscopy;  Laterality: Left;  . ESOPHAGOGASTRODUODENOSCOPY N/A 09/24/2015  Procedure: ESOPHAGOGASTRODUODENOSCOPY (EGD);  [place]: Juanita Craver, MD;  Location: [facility] ENDOSCOPY;  Service: Endoscopy;  Laterality: N/A;  . EUS N/A 10/05/2015   Procedure: LOWER ENDOSCOPIC ULTRASOUND (EUS);  [place]: Milus Banister, MD;  Location: [facility] ENDOSCOPY;  Service: Endoscopy;  Laterality: N/A;  . s/p breast biopsy  4/07   benign    SOCIAL HISTORY: Social History   Social History  . Marital status:  Single    Spouse name: N/A  . Number of children: 2  . Years of education: N/A   Occupational History  .  [employer]   Social History Main Topics  . Smoking status: Never Smoker  . Smokeless tobacco: Never Used  . Alcohol use No  . Drug use: No  . Sexual activity: Yes    Birth control/ protection: Post-menopausal   Other Topics Concern  . Not on file   Social History Narrative  . No narrative on file    FAMILY HISTORY: Family History  Problem Relation Age of Onset  . Diabetes Mother   . Heart disease Father   . Diabetes Father   . Hypertension Father   . Diabetes Sister   . Diabetes Other   . Diabetes Brother     ALLERGIES:  has No Known Allergies.  MEDICATIONS:  Current Outpatient Prescriptions  Medication Sig Dispense Refill  . amLODipine (NORVASC) 10 MG tablet Take 1 tablet (10 mg total) by mouth daily. 30 tablet 10  . atenolol (TENORMIN) 25 MG tablet Take 0.5 tablets (12.5 mg total) by mouth 2 (two) times daily. 60 tablet 0  . atorvastatin (LIPITOR) 20 MG tablet Take 1 tablet (20 mg total) by mouth daily. 90 tablet 3  . capecitabine (XELODA) 500 MG tablet Take 3 tablets (1,500 mg total) by mouth 2 (two) times daily after a meal. 90 tablet 1  . clonazePAM (KLONOPIN) 0.5 MG tablet Take 1 tablet (0.5 mg total) by mouth 2 (two) times daily as needed (anxiety). 30 tablet 0  . GLIPIZIDE XL 5 MG 24 hr tablet TAKE TWO TABLETS BY MOUTH DAILY 180 tablet 2  . hydrochlorothiazide (HYDRODIURIL) 25 MG tablet Take 1 tablet (25 mg total) by mouth daily. 30 tablet 5  . HYDROcodone-acetaminophen (NORCO) 5-325 MG tablet Take 1 tablet by mouth every 6 (six) hours as needed for moderate pain. 30 tablet 0  . irbesartan (AVAPRO) 300 MG tablet Take 1 tablet (300 mg total) by mouth at bedtime. 90 tablet 3  . ondansetron (ZOFRAN) 8 MG tablet Take 1 tablet (8 mg total) by mouth every 8 (eight) hours as needed for nausea. 30 tablet 3  . prochlorperazine (COMPAZINE) 10 MG tablet Take 1  tablet (10 mg total) by mouth every 6 (six) hours as needed for nausea or vomiting. 30 tablet 0  . sitaGLIPtin-metformin (JANUMET) 50-1000 MG per tablet Take 1 tablet by mouth 2 (two) times daily. 180 tablet 3  . potassium chloride SA (K-DUR,KLOR-CON) 20 MEQ tablet Take 1 tablet (20 mEq total) by mouth daily. 30 tablet 0   No current facility-administered medications for this visit.     REVIEW OF SYSTEMS:   Constitutional: Denies fevers, chills or abnormal night sweats Eyes: Denies blurriness of vision, double vision or watery eyes Ears, nose, mouth, throat, and face: Denies mucositis or sore throat Respiratory: Denies cough, dyspnea or wheezes Cardiovascular: Denies palpitation, chest discomfort or lower extremity swelling Gastrointestinal:  Denies nausea, heartburn or change in bowel habits Skin: Denies abnormal skin rashes Lymphatics: Denies new lymphadenopathy or  easy bruising Neurological:Denies numbness, tingling or new weaknesses Behavioral/Psych: Mood is stable, no new changes  All other systems were reviewed with the patient and are negative.  PHYSICAL EXAMINATION: ECOG PERFORMANCE STATUS: 0 - Asymptomatic  Vitals:   11/21/15 1525  BP: (!) 149/61  Pulse: (!) 104  Resp: 18  Temp: 97.8 F (36.6 C)   Filed Weights   11/21/15 1525  Weight: 157 lb 6.4 oz (71.4 kg)    GENERAL:alert, no distress and comfortable SKIN: skin color, texture, turgor are normal, no rashes or significant lesions EYES: normal, conjunctiva are pink and non-injected, sclera clear OROPHARYNX:no exudate, no erythema and lips, buccal mucosa, and tongue normal  NECK: supple, thyroid normal size, non-tender, without nodularity LYMPH:  no palpable lymphadenopathy in the cervical, axillary or inguinal LUNGS: clear to auscultation and percussion with normal breathing effort HEART: regular rate & rhythm, 2/6 systolic murmurs, no lower extremity edema ABDOMEN:abdomen soft, non-tender and normal bowel  sounds, no organomegaly. Rectal exam was not performed. Musculoskeletal:no cyanosis of digits and no clubbing  PSYCH: alert & oriented x 3 with fluent speech NEURO: no focal motor/sensory deficits Breast exam: deferred   LABORATORY DATA:  I have reviewed the data as listed CBC Latest Ref Rng & Units 11/21/2015 11/14/2015 11/07/2015  WBC 3.9 - 10.3 10e3/uL 2.4(L) 3.2(L) 2.9(L)  Hemoglobin 11.6 - 15.9 g/dL 10.2(L) 9.8(L) 10.2(L)  Hematocrit 34.8 - 46.6 % 32.3(L) 31.3(L) 31.9(L)  Platelets 145 - 400 10e3/uL 291 247 257   CMP Latest Ref Rng & Units 11/21/2015 11/14/2015 11/07/2015  Glucose 70 - 140 mg/dl 127 89 130  BUN 7.0 - 26.0 mg/dL 14.4 19.0 14.6  Creatinine 0.6 - 1.1 mg/dL 0.8 0.7 0.7  Sodium 136 - 145 mEq/L 142 140 140  Potassium 3.5 - 5.1 mEq/L 3.1(L) 3.4(L) 3.8  Chloride 101 - 111 mmol/L - - -  CO2 22 - 29 mEq/L 30(H) 27 28  Calcium 8.4 - 10.4 mg/dL 10.0 9.6 9.7  Total Protein 6.4 - 8.3 g/dL 7.4 7.0 7.2  Total Bilirubin 0.20 - 1.20 mg/dL 0.75 0.65 0.71  Alkaline Phos 40 - 150 U/L 62 59 65  AST 5 - 34 U/L 13 14 14   ALT 0 - 55 U/L 12 15 15    ANC 1.8 today    PATHOLOGY REPORT  Diagnosis 09/24/2015 Colon, biopsy, Rectal sigmoid mass - ADENOCARCINOMA. - SEE COMMENT. Microscopic Comment Dr. Orene Desanctis has reviewed the case and concurs with this interpretation. Dr. Collene Mares was paged on 09/26/15. (JBK:gt, 09/26/15)   RADIOGRAPHIC STUDIES: I have personally reviewed the radiological images as listed and agreed with the findings in the report. No results found.   EUS 10/05/2015 Dr. Ardis Hughs  IMPRESSION:  Partially circumferential, 6cm long uT3N0 rectosigmoid adenocarcinoma with distal edge located 9cm from the anal verge. Submucosal injections of [place]k, at proximal and distal edges of the mass.  Colonoscopy and EGD 09/24/2015 Dr. Collene Mares  - Large circumferental polypoid mass in the rectosigmoid colon from 10-20 cm-biopsies Done. - Normal appearing, widely patent esophagus and GEJ. - Small  hiatal hernia noted on retroflexion. - Mild antral gastritis-otherwise normal appearing stomach. - Normal examined duodenum. - No specimens collected.  ASSESSMENT & PLAN: 60 year old female with past medical history of diabetes and hypertension, presented with rectal bleeding. Colonoscopy showed a large proximal rectal mass.  1. Rectal adenocarcinoma, proximal rectum, uT3N0Mo, stage IIA -I have reviewed her colonoscopy, EUS, and CT of abdomen pelvis finding with pt in details -The EUS showed a T3 lesion, no  suspicious lymphadenopathy. The CT scan showed no evidence of adenopathy or distant metastasis. She has stage IIA disease -We reviewed the natural history of rectal cancer and risk of recurrence after surgery. Given the stage II a disease, she likely has moderate risk of recurrence. We discussed the treatment option for stage IIA rectal cancer. The standard of care is neoadjuvant chemotherapy and radiation, followed by surgery, then adjuvant chemotherapy. -She has been tolerating concurrent radiation and chemotherapy with Xeloda 1500 mg twice daily very well, no significant side effects so far -We discussed at that side effects more commonly happen at the second part of radiation, especially towards the end of treatment. -Lab results reviewed, she has mild leukocytosis, ANC normal, neutropenia precautions was discussed with patient again today -She is scheduled to see her [place] Dr. Marcello Moores in a few weeks after she completes chemoradiation. -We reviewed the overall plan with surgery in 6-8 weeks after she completes chemoradiation, followed by adjuvant chemotherapy.  2. Iron deficient anemia secondary to GI bleeding -Her recent iron study was consistent with iron deficient anemia. -She has received blood transfusion and iv feraheme twice  -I encouraged her to try oral ferrous sulfate 1 tablet a day  3. Anxiety  -Secondary to cancer diagnosis. -Improved lately.  4. HTN and DM -Continue  medication. She'll follow-up with her primary care physician -I discussed the impact of chemotherapy on her blood pressure and diabetes, we'll monitor her blood pressure and glucose level closely, and is just the medication if needed. -Her blood pressure has been high lately, I encouraged her to monitor at home and follow-up with her primary care physician  5. Left breast mass -CT chest revealed a left breast mass, however her screening mammogram was negative in 09/2015 -I have ordered  diagnostic mammogram and ultrasound of left breast and axilla for further evaluation. It has not been scheduled, will follow   Plan -continue chemoradiation with Xeloda 1500 mg twice daily, last treatment on 8/18 -I'll see her back in 4 weeks -We'll call breast Center for her left breast diagnostic mammogram and ultrasound schedules, needs to be done asap   All questions were answered. The patient knows to call the clinic with any problems, questions or concerns.  I spent 20 minutes counseling the patient face to face. The total time spent in the appointment was 25 minutes and more than 50% was on counseling.     [name] Merle, MD 11/21/2015 5:46 PM

## 2015-11-22 ENCOUNTER — Ambulatory Visit
Admission: RE | Admit: 2015-11-22 | Discharge: 2015-11-22 | Disposition: A | Discharge status: Home / Self Care | Payer: 59 | Source: Ambulatory Visit | Attending: Radiation Oncology | Admitting: Radiation Oncology

## 2015-11-22 DIAGNOSIS — Z510 Encounter for antineoplastic radiation therapy: Secondary | ICD-10-CM | POA: Diagnosis not present

## 2015-11-23 ENCOUNTER — Encounter: Payer: Self-pay | Admitting: *Deleted

## 2015-11-23 ENCOUNTER — Other Ambulatory Visit: Payer: Self-pay | Admitting: *Deleted

## 2015-11-23 ENCOUNTER — Ambulatory Visit
Admission: RE | Admit: 2015-11-23 | Discharge: 2015-11-23 | Disposition: A | Discharge status: Home / Self Care | Payer: 59 | Source: Ambulatory Visit | Attending: Radiation Oncology | Admitting: Radiation Oncology

## 2015-11-23 ENCOUNTER — Telehealth: Payer: Self-pay | Admitting: *Deleted

## 2015-11-23 DIAGNOSIS — Z510 Encounter for antineoplastic radiation therapy: Secondary | ICD-10-CM | POA: Diagnosis not present

## 2015-11-23 NOTE — Progress Notes (Signed)
Oncology Nurse Navigator Documentation  Oncology Nurse Navigator Flowsheets 11/23/2015  Navigator Location [facility]  Navigator Encounter Type Initial RadOnc;Treatment  Abnormal Finding Date -  Confirmed Diagnosis Date -  Treatment Initiated Date 10/25/2015  Patient Visit Type RadOnc  Treatment Phase Active Tx  Barriers/Navigation Needs No barriers at this time;No Questions;No Needs  Interventions None required  Acuity Level 1  Time Spent with Patient 15  Navigator introduced herself to patient and explained role. Provided contact information. Reports treatment is going well and she has obtained her Xeloda with reasonable copay. No navigation needs at this time. Last treatment is 12/01/15 and she sees Dr. Marcello Moores on 8/21.

## 2015-11-23 NOTE — Telephone Encounter (Signed)
Spoke with [employer] @ [facility] to inquire about scheduling pt for mammogram and [place] - as ordered by Dr. Hulen Skains.  Per [employer], Dr. Theda Sers will review pt's outside films and mammogram.  Pt will be contacted for appts as needed with Dr. Theda Sers' instructions.

## 2015-11-24 ENCOUNTER — Encounter: Payer: Self-pay | Admitting: Radiation Oncology

## 2015-11-24 ENCOUNTER — Ambulatory Visit
Admission: RE | Admit: 2015-11-24 | Discharge: 2015-11-24 | Disposition: A | Discharge status: Home / Self Care | Payer: 59 | Source: Ambulatory Visit | Attending: Radiation Oncology | Admitting: Radiation Oncology

## 2015-11-24 VITALS — BP 141/63 | HR 89 | Temp 97.9°F | Wt 156.0 lb

## 2015-11-24 DIAGNOSIS — Z510 Encounter for antineoplastic radiation therapy: Secondary | ICD-10-CM | POA: Diagnosis not present

## 2015-11-24 DIAGNOSIS — C20 Malignant neoplasm of rectum: Secondary | ICD-10-CM

## 2015-11-24 NOTE — Progress Notes (Addendum)
Weekly rad tx rectum, c.o itchiness bottom,  No nausea,  Has 2-3 loose stools daily, no bleeding,   Appetite good,  No dysuria or hematuria,  Energy level better 2:21 PM   Wt Readings from Last 3 Encounters:  11/21/15 157 lb 6.4 oz (71.4 kg)  11/17/15 156 lb 4.8 oz (70.9 kg)  11/10/15 155 lb 14.4 oz (70.7 kg)  BP (!) 141/63 (BP Location: Left Arm, Patient Position: Sitting, Cuff Size: Normal)   Pulse 89   Temp 97.9 F (36.6 C) (Oral)   Wt 156 lb (70.8 kg)   BMI 25.56 kg/m

## 2015-11-24 NOTE — Progress Notes (Signed)
Department of Radiation Oncology  Phone:  [phone] Fax:        [phone]  Weekly Treatment Note    Name: Karen Good Date: 11/26/2015 MRN: [id number] DOB: October 17, 1955   Diagnosis:     ICD-9-CM ICD-10-CM   1. Rectal cancer (HCC) 154.1 C20      Current dose: 41.4 Gy  Current fraction: 23   MEDICATIONS: Current Outpatient Prescriptions  Medication Sig Dispense Refill  . amLODipine (NORVASC) 10 MG tablet Take 1 tablet (10 mg total) by mouth daily. 30 tablet 10  . atenolol (TENORMIN) 25 MG tablet Take 0.5 tablets (12.5 mg total) by mouth 2 (two) times daily. 60 tablet 0  . atorvastatin (LIPITOR) 20 MG tablet Take 1 tablet (20 mg total) by mouth daily. 90 tablet 3  . capecitabine (XELODA) 500 MG tablet Take 3 tablets (1,500 mg total) by mouth 2 (two) times daily after a meal. 90 tablet 1  . GLIPIZIDE XL 5 MG 24 hr tablet TAKE TWO TABLETS BY MOUTH DAILY 180 tablet 2  . hydrochlorothiazide (HYDRODIURIL) 25 MG tablet Take 1 tablet (25 mg total) by mouth daily. 30 tablet 5  . HYDROcodone-acetaminophen (NORCO) 5-325 MG tablet Take 1 tablet by mouth every 6 (six) hours as needed for moderate pain. 30 tablet 0  . irbesartan (AVAPRO) 300 MG tablet Take 1 tablet (300 mg total) by mouth at bedtime. 90 tablet 3  . ondansetron (ZOFRAN) 8 MG tablet Take 1 tablet (8 mg total) by mouth every 8 (eight) hours as needed for nausea. 30 tablet 3  . potassium chloride SA (K-DUR,KLOR-CON) 20 MEQ tablet Take 1 tablet (20 mEq total) by mouth daily. 30 tablet 0  . prochlorperazine (COMPAZINE) 10 MG tablet Take 1 tablet (10 mg total) by mouth every 6 (six) hours as needed for nausea or vomiting. 30 tablet 0  . sitaGLIPtin-metformin (JANUMET) 50-1000 MG per tablet Take 1 tablet by mouth 2 (two) times daily. 180 tablet 3  . clonazePAM (KLONOPIN) 0.5 MG tablet Take 1 tablet (0.5 mg total) by mouth 2 (two) times daily as needed (anxiety). (Patient not taking: Reported on 11/24/2015) 30 tablet 0   No  current facility-administered medications for this encounter.      ALLERGIES: Review of patient's allergies indicates no known allergies.   LABORATORY DATA:  Lab Results  Component Value Date   WBC 2.4 (L) 11/21/2015   HGB 10.2 (L) 11/21/2015   HCT 32.3 (L) 11/21/2015   MCV 78.1 (L) 11/21/2015   PLT 291 11/21/2015   Lab Results  Component Value Date   NA 142 11/21/2015   K 3.1 (L) 11/21/2015   CL 110 09/26/2015   CO2 30 (H) 11/21/2015   Lab Results  Component Value Date   ALT 12 11/21/2015   AST 13 11/21/2015   ALKPHOS 62 11/21/2015   BILITOT 0.75 11/21/2015     NARRATIVE: Karen Good was seen today for weekly treatment management. The chart was checked and the patient's films were reviewed.  Karen Good returns for weekly radiation treatment to the rectum. She complains of slight itchiness on her bottom. Denies nausea. Reports she has 2-3 loose stools daily. Denies bleeding. Appetite good. Denies dysuria or hematuria. Energy level is better. She is doing well.   PHYSICAL EXAMINATION: weight is 156 lb (70.8 kg). Her oral temperature is 97.9 F (36.6 C). Her blood pressure is 141/63 (abnormal) and her pulse is 89.     Alert, in no acute distress.   ASSESSMENT:  The patient is doing satisfactorily with treatment.  PLAN: We will continue with the patient's radiation treatment as planned. Encouraged the patient to begin applying hydrocortisone cream to the treatment area to resolve itchiness.     ------------------------------------------------  Jodelle Gross, MD, PhD  This document serves as a record of services personally performed by Kyung Rudd, MD. It was created on his behalf by Arlyce Harman, a trained medical scribe. The creation of this record is based on the scribe's personal observations and the provider's statements to them. This document has been checked and approved by the attending provider.

## 2015-11-26 ENCOUNTER — Telehealth: Payer: Self-pay | Admitting: Hematology

## 2015-11-26 NOTE — Telephone Encounter (Signed)
Lvm advising appt 9/8 @ 1.45pm.

## 2015-11-27 ENCOUNTER — Ambulatory Visit
Admission: RE | Admit: 2015-11-27 | Discharge: 2015-11-27 | Disposition: A | Discharge status: Home / Self Care | Payer: 59 | Source: Ambulatory Visit | Attending: Radiation Oncology | Admitting: Radiation Oncology

## 2015-11-27 ENCOUNTER — Telehealth: Payer: Self-pay

## 2015-11-27 DIAGNOSIS — Z510 Encounter for antineoplastic radiation therapy: Secondary | ICD-10-CM | POA: Diagnosis not present

## 2015-11-27 NOTE — Telephone Encounter (Signed)
Pt called stating her message from yesterday was all staticy. Called pt back with appt message from yesteday. She wrote it down.

## 2015-11-28 ENCOUNTER — Ambulatory Visit
Admission: RE | Admit: 2015-11-28 | Discharge: 2015-11-28 | Disposition: A | Discharge status: Home / Self Care | Payer: 59 | Source: Ambulatory Visit | Attending: Radiation Oncology | Admitting: Radiation Oncology

## 2015-11-28 ENCOUNTER — Other Ambulatory Visit (HOSPITAL_BASED_OUTPATIENT_CLINIC_OR_DEPARTMENT_OTHER): Payer: 59

## 2015-11-28 DIAGNOSIS — C20 Malignant neoplasm of rectum: Secondary | ICD-10-CM | POA: Diagnosis not present

## 2015-11-28 DIAGNOSIS — Z510 Encounter for antineoplastic radiation therapy: Secondary | ICD-10-CM | POA: Diagnosis not present

## 2015-11-28 DIAGNOSIS — D500 Iron deficiency anemia secondary to blood loss (chronic): Secondary | ICD-10-CM | POA: Diagnosis not present

## 2015-11-28 DIAGNOSIS — D509 Iron deficiency anemia, unspecified: Secondary | ICD-10-CM

## 2015-11-28 LAB — CBC WITH DIFFERENTIAL/PLATELET
BASO%: 0.2 % (ref 0.0–2.0)
BASOS ABS: 0 10*3/uL (ref 0.0–0.1)
EOS ABS: 0 10*3/uL (ref 0.0–0.5)
EOS%: 1.5 % (ref 0.0–7.0)
HEMATOCRIT: 31.9 % — AB (ref 34.8–46.6)
HGB: 9.9 g/dL — ABNORMAL LOW (ref 11.6–15.9)
LYMPH#: 0.2 10*3/uL — AB (ref 0.9–3.3)
LYMPH%: 7.2 % — ABNORMAL LOW (ref 14.0–49.7)
MCH: 24.9 pg — ABNORMAL LOW (ref 25.1–34.0)
MCHC: 31.2 g/dL — AB (ref 31.5–36.0)
MCV: 79.7 fL (ref 79.5–101.0)
MONO#: 0.3 10*3/uL (ref 0.1–0.9)
MONO%: 10.4 % (ref 0.0–14.0)
NEUT#: 2.5 10*3/uL (ref 1.5–6.5)
NEUT%: 80.7 % — AB (ref 38.4–76.8)
PLATELETS: 275 10*3/uL (ref 145–400)
RBC: 4 10*6/uL (ref 3.70–5.45)
RDW: 33 % — ABNORMAL HIGH (ref 11.2–14.5)
WBC: 3.1 10*3/uL — ABNORMAL LOW (ref 3.9–10.3)

## 2015-11-28 LAB — COMPREHENSIVE METABOLIC PANEL
ALT: 25 U/L (ref 0–55)
ANION GAP: 10 meq/L (ref 3–11)
AST: 21 U/L (ref 5–34)
Albumin: 3.9 g/dL (ref 3.5–5.0)
Alkaline Phosphatase: 67 U/L (ref 40–150)
BILIRUBIN TOTAL: 0.87 mg/dL (ref 0.20–1.20)
BUN: 12.2 mg/dL (ref 7.0–26.0)
CO2: 29 meq/L (ref 22–29)
CREATININE: 0.7 mg/dL (ref 0.6–1.1)
Calcium: 9.9 mg/dL (ref 8.4–10.4)
Chloride: 102 mEq/L (ref 98–109)
EGFR: >90 mL/min/{1.73_m2} (ref >90)
GLUCOSE: 130 mg/dL (ref 70–140)
Potassium: 3.3 mEq/L — ABNORMAL LOW (ref 3.5–5.1)
Sodium: 141 mEq/L (ref 136–145)
TOTAL PROTEIN: 7.2 g/dL (ref 6.4–8.3)

## 2015-11-29 ENCOUNTER — Ambulatory Visit
Admission: RE | Admit: 2015-11-29 | Discharge: 2015-11-29 | Disposition: A | Discharge status: Home / Self Care | Payer: 59 | Source: Ambulatory Visit | Attending: Radiation Oncology | Admitting: Radiation Oncology

## 2015-11-29 DIAGNOSIS — Z510 Encounter for antineoplastic radiation therapy: Secondary | ICD-10-CM | POA: Diagnosis not present

## 2015-11-29 LAB — FERRITIN: FERRITIN: 226 ng/mL (ref 9–269)

## 2015-11-30 ENCOUNTER — Ambulatory Visit
Admission: RE | Admit: 2015-11-30 | Discharge: 2015-11-30 | Disposition: A | Discharge status: Home / Self Care | Payer: 59 | Source: Ambulatory Visit | Attending: Radiation Oncology | Admitting: Radiation Oncology

## 2015-11-30 DIAGNOSIS — Z510 Encounter for antineoplastic radiation therapy: Secondary | ICD-10-CM | POA: Diagnosis not present

## 2015-12-01 ENCOUNTER — Ambulatory Visit
Admission: RE | Admit: 2015-12-01 | Discharge: 2015-12-01 | Disposition: A | Discharge status: Home / Self Care | Payer: 59 | Source: Ambulatory Visit | Attending: Radiation Oncology | Admitting: Radiation Oncology

## 2015-12-01 ENCOUNTER — Encounter: Payer: Self-pay | Admitting: Radiation Oncology

## 2015-12-01 VITALS — BP 150/59 | HR 95 | Temp 98.7°F | Resp 20 | Wt 158.3 lb

## 2015-12-01 DIAGNOSIS — Z510 Encounter for antineoplastic radiation therapy: Secondary | ICD-10-CM | POA: Diagnosis not present

## 2015-12-01 DIAGNOSIS — C20 Malignant neoplasm of rectum: Secondary | ICD-10-CM

## 2015-12-01 NOTE — Progress Notes (Signed)
  Radiation Oncology         [phone] ________________________________  Name: Karen Good MRN: [id number]  Date: 11/20/2015  DOB: 27-Jul-1955  COMPLEX SIMULATION  NOTE  Diagnosis: rectal cancer  Narrative The patient has initially been planned to receive a course of radiation treatment to a dose of 45 Gy in 25 fractions at 1.8 gray per fraction. The patient will now receive a boost to the high risk target volume for an additional 5.4 Gy. This will be delivered in 3 fractions at 1.8 Gy per fraction and a cone down boost technique will be utilized. To accomplish this, an additional 4 customized blocks have been designed for this purpose. A complex isodose plan is requested to ensure that the high-risk target region receives the appropriate radiation dose and that the nearby normal structures continue to be appropriately spared. The patient's final total dose therefore will be 50.4 Gy.   ________________________________ ------------------------------------------------  Jodelle Gross, MD, PhD

## 2015-12-01 NOTE — Progress Notes (Signed)
Department of Radiation Oncology  Phone:  [phone] Fax:        [phone]  Weekly Treatment Note    Name: Karen Good Date: 12/01/2015 MRN: [id number] DOB: September 30, 1955   Current dose: 50.4 Gy  Current fraction: 28   MEDICATIONS: Current Outpatient Prescriptions  Medication Sig Dispense Refill  . amLODipine (NORVASC) 10 MG tablet Take 1 tablet (10 mg total) by mouth daily. 30 tablet 10  . atenolol (TENORMIN) 25 MG tablet Take 0.5 tablets (12.5 mg total) by mouth 2 (two) times daily. 60 tablet 0  . atorvastatin (LIPITOR) 20 MG tablet Take 1 tablet (20 mg total) by mouth daily. 90 tablet 3  . capecitabine (XELODA) 500 MG tablet Take 3 tablets (1,500 mg total) by mouth 2 (two) times daily after a meal. 90 tablet 1  . GLIPIZIDE XL 5 MG 24 hr tablet TAKE TWO TABLETS BY MOUTH DAILY 180 tablet 2  . hydrochlorothiazide (HYDRODIURIL) 25 MG tablet Take 1 tablet (25 mg total) by mouth daily. 30 tablet 5  . HYDROcodone-acetaminophen (NORCO) 5-325 MG tablet Take 1 tablet by mouth every 6 (six) hours as needed for moderate pain. 30 tablet 0  . irbesartan (AVAPRO) 300 MG tablet Take 1 tablet (300 mg total) by mouth at bedtime. 90 tablet 3  . ondansetron (ZOFRAN) 8 MG tablet Take 1 tablet (8 mg total) by mouth every 8 (eight) hours as needed for nausea. 30 tablet 3  . potassium chloride SA (K-DUR,KLOR-CON) 20 MEQ tablet Take 1 tablet (20 mEq total) by mouth daily. 30 tablet 0  . prochlorperazine (COMPAZINE) 10 MG tablet Take 1 tablet (10 mg total) by mouth every 6 (six) hours as needed for nausea or vomiting. 30 tablet 0  . sitaGLIPtin-metformin (JANUMET) 50-1000 MG per tablet Take 1 tablet by mouth 2 (two) times daily. 180 tablet 3  . clonazePAM (KLONOPIN) 0.5 MG tablet Take 1 tablet (0.5 mg total) by mouth 2 (two) times daily as needed (anxiety). (Patient not taking: Reported on 12/01/2015) 30 tablet 0   No current facility-administered medications for this encounter.      ALLERGIES:  Review of patient's allergies indicates no known allergies.   LABORATORY DATA:  Lab Results  Component Value Date   WBC 3.1 (L) 11/28/2015   HGB 9.9 (L) 11/28/2015   HCT 31.9 (L) 11/28/2015   MCV 79.7 11/28/2015   PLT 275 11/28/2015   Lab Results  Component Value Date   NA 141 11/28/2015   K 3.3 (L) 11/28/2015   CL 110 09/26/2015   CO2 29 11/28/2015   Lab Results  Component Value Date   ALT 25 11/28/2015   AST 21 11/28/2015   ALKPHOS 67 11/28/2015   BILITOT 0.87 11/28/2015     NARRATIVE: Karen Good was seen today for weekly treatment management. The chart was checked and the patient's films were reviewed. The patient is doing very well today. Some itchiness in the bottom area but overall has done excellent. No significant increase in pain.  Weekly radiation  Treatments xs rectal 28/28 o, gave 1 month f/u appt 01/16/16 with Karen Good,  3:11 PM BP (!) 150/59 (BP Location: Right Arm, Patient Position: Sitting, Cuff Size: Large)   Pulse 95   Temp 98.7 F (37.1 C) (Oral)   Resp 20   Wt 158 lb 4.8 oz (71.8 kg)   BMI 25.94 kg/m   BP (!) 150/59 (BP Location: Right Arm, Patient Position: Sitting, Cuff Size: Large)   Pulse 95  Temp 98.7 F (37.1 C) (Oral)   Resp 20   Wt 158 lb 4.8 oz (71.8 kg)   BMI 25.94 kg/m   PHYSICAL EXAMINATION: weight is 158 lb 4.8 oz (71.8 kg). Her oral temperature is 98.7 F (37.1 C). Her blood pressure is 150/59 (abnormal) and her pulse is 95. Her respiration is 20.        ASSESSMENT: The patient did satisfactorily with treatment.  PLAN: The patient will follow-up in our clinic in 1 month.

## 2015-12-01 NOTE — Progress Notes (Addendum)
Weekly radiation  Treatments xs rectal 28/28 o, gave 1 month f/u appt 01/16/16 with Shona Simpson,  2:09 PM BP (!) 150/59 (BP Location: Right Arm, Patient Position: Sitting, Cuff Size: Large)   Pulse 95   Temp 98.7 F (37.1 C) (Oral)   Resp 20   Wt 158 lb 4.8 oz (71.8 kg)   BMI 25.94 kg/m   BP (!) 150/59 (BP Location: Right Arm, Patient Position: Sitting, Cuff Size: Large)   Pulse 95   Temp 98.7 F (37.1 C) (Oral)   Resp 20   Wt 158 lb 4.8 oz (71.8 kg)   BMI 25.94 kg/m

## 2015-12-04 ENCOUNTER — Other Ambulatory Visit: Payer: Self-pay | Admitting: General Surgery

## 2015-12-04 ENCOUNTER — Other Ambulatory Visit: Payer: Self-pay | Admitting: *Deleted

## 2015-12-04 MED ORDER — SITAGLIPTIN PHOS-METFORMIN HCL 50-1000 MG PO TABS: 1.0000 | ORAL_TABLET | Freq: Two times a day (BID) | ORAL | 3 refills | Status: DC

## 2015-12-04 NOTE — H&P (Signed)
History of Present Illness Leighton Ruff MD; [id number] 10:26 AM) The patient is a 60 year old female who presents with colorectal cancer. Patient hospitalized for rectal bleeding. Colonoscopy revealed a large rectosigmoid mass. Biopsies confirmed adenocarcinoma. CEA was normal at 4.1. Rectal ultrasound showed a T3 N0 mass. The area was tattooed. CT scans showed no signs of metastatic disease. She has completed neoadjunvant ChemoRT. She is having 1-2 soft bowel movements a day and denies any bleeding. She denies abdominal pain.   Problem List/Past Medical Leighton Ruff, MD; [id number] 10:26 AM) RECTAL CANCER (C20) LEFT BREAST MASS (N63)  Other Problems Leighton Ruff, MD; [id number] 10:26 AM) Hypercholesterolemia Colon Cancer High blood pressure  Past Surgical History Leighton Ruff, MD; [id number] 10:26 AM) No pertinent past surgical history  Diagnostic Studies History Leighton Ruff, MD; [id number] 10:26 AM) Pap Smear >5 years ago Colonoscopy within last year Mammogram within last year  Allergies Elbert Ewings, [place]; 12/04/2015 9:41 AM) No Known Drug Allergies 10/06/2015  Medication History Leighton Ruff, MD; [id number] 10:26 AM) Hydrocodone-Acetaminophen (5-325MG  Tablet, Oral) Active. ClonazePAM (0.5MG  Tablet, Oral) Active. AmLODIPine Besylate (10MG  Tablet, Oral) Active. Atenolol (100MG  Tablet, Oral) Active. Atorvastatin Calcium (20MG  Tablet, Oral) Active. GlipiZIDE XL (5MG  Tablet ER 24HR, Oral) Active. HydroCHLOROthiazide (25MG  Tablet, Oral) Active. Irbesartan (300MG  Tablet, Oral) Active. Janumet (50-1000MG  Tablet, Oral) Active. Atenolol (25MG  Tablet, Oral) Active. Klor-Con M10 (10MEQ Tablet ER, Oral) Active. Pioglitazone HCl (45MG  Tablet, Oral) Active. Aspirin (81MG  Tablet Chewable, Oral) Active. Medications Reconciled Neomycin Sulfate (500MG  Tablet, 2 (two) Tablet Oral SEE NOTE, Taken starting 12/04/2015) Active. (TAKE TWO TABLETS AT 2 PM, 3  PM, AND 10 PM THE DAY PRIOR TO SURGERY) Flagyl (500MG  Tablet, 2 (two) Tablet Oral SEE NOTE, Taken starting 12/04/2015) Active. (Take at 2pm, 3pm, and 10pm the day prior to your colon operation)  Social History Leighton Ruff, MD; [id number] 10:26 AM) Tobacco use Never smoker. No drug use Alcohol use Occasional alcohol use. No caffeine use  Family History Leighton Ruff, MD; [id number] 10:26 AM) Diabetes Mellitus Brother, Father, Mother, Sister. Heart Disease Father, Sister. Hypertension Brother, Daughter, Father, Mother, Sister.  Pregnancy / Birth History Leighton Ruff, MD; [id number] 10:26 AM) Age at menarche 37 years. Para 2 Age of menopause 33-55 Maternal age 76-25     Review of Systems Leighton Ruff MD; [id number] 10:26 AM) General Present- Weight Loss. Not Present- Appetite Loss, Chills, Fatigue, Fever, Night Sweats and Weight Gain. Skin Not Present- Change in Wart/Mole, Dryness, Hives, Jaundice, New Lesions, Non-Healing Wounds, Rash and Ulcer. HEENT Not Present- Earache, Hearing Loss, Hoarseness, Nose Bleed, Oral Ulcers, Ringing in the Ears, Seasonal Allergies, Sinus Pain, Sore Throat, Visual Disturbances, Wears glasses/contact lenses and Yellow Eyes. Respiratory Not Present- Bloody sputum, Chronic Cough, Difficulty Breathing, Snoring and Wheezing. Breast Not Present- Breast Mass, Breast Pain, Nipple Discharge and Skin Changes. Cardiovascular Not Present- Chest Pain, Difficulty Breathing Lying Down, Leg Cramps, Palpitations, Rapid Heart Rate, Shortness of Breath and Swelling of Extremities. Gastrointestinal Present- Bloody Stool. Not Present- Abdominal Pain, Bloating, Change in Bowel Habits, Chronic diarrhea, Constipation, Difficulty Swallowing, Excessive gas, Gets full quickly at meals, Hemorrhoids, Indigestion, Nausea, Rectal Pain and Vomiting. Female Genitourinary Not Present- Frequency, Nocturia, Painful Urination, Pelvic Pain and Urgency. Musculoskeletal Not  Present- Back Pain, Joint Pain, Joint Stiffness, Muscle Pain, Muscle Weakness and Swelling of Extremities. Neurological Not Present- Decreased Memory, Fainting, Headaches, Numbness, Seizures, Tingling, Tremor, Trouble walking and Weakness. Psychiatric Not Present- Anxiety, Bipolar, Change in Sleep Pattern, Depression, Fearful and Frequent crying. Endocrine Not Present- Cold Intolerance, Excessive  Hunger, Hair Changes, Heat Intolerance, Hot flashes and New Diabetes. Hematology Not Present- Easy Bruising, Excessive bleeding, Gland problems, HIV and Persistent Infections.  Vitals Elbert Ewings CMA; 12/04/2015 9:42 AM) 12/04/2015 9:42 AM Weight: 155 lb Height: 65in Body Surface Area: 1.78 m Body Mass Index: 25.79 kg/m  Temp.: 97.89F(Temporal)  Pulse: 69 (Regular)  BP: 132/82 (Sitting, Left Arm, Standard)      Physical Exam Leighton Ruff MD; [id number] 10:26 AM)  General Mental Status-Alert. General Appearance-Not in acute distress. Build & Nutrition-Well nourished. Posture-Normal posture. Gait-Normal.  Head and Neck Head-normocephalic, atraumatic with no lesions or palpable masses. Trachea-midline.  Chest and Lung Exam Chest and lung exam reveals -on auscultation, normal breath sounds, no adventitious sounds and normal vocal resonance.  Cardiovascular Cardiovascular examination reveals -normal heart sounds, regular rate and rhythm with no murmurs.  Abdomen Inspection Inspection of the abdomen reveals - No Hernias. Palpation/Percussion Palpation and Percussion of the abdomen reveal - Soft, Non Tender, No Rigidity (guarding), No hepatosplenomegaly and No Palpable abdominal masses.  Neurologic Neurologic evaluation reveals -alert and oriented x 3 with no impairment of recent or remote memory, normal attention span and ability to concentrate, normal sensation and normal coordination.  Musculoskeletal Normal Exam - Bilateral-Upper Extremity  Strength Normal and Lower Extremity Strength Normal.    Assessment & Plan Leighton Ruff MD; [id number] 10:05 AM)  RECTAL CANCER (C20) Impression: 60 year old female with a uT3 N0 proximal rectal cancer, who has now completed her neoadjuvant chemoradiation. She is ready to schedule surgery. We will get this set up for approximately 6-8 weeks from now. The surgery and anatomy were described to the patient as well as the risks of surgery and the possible complications. These include: Bleeding, deep abdominal infections and possible wound complications such as hernia and infection, damage to adjacent structures, leak of surgical connections, which can lead to other surgeries and possibly an ostomy, possible need for other procedures, such as abscess drains in radiology, possible prolonged hospital stay, possible diarrhea from removal of part of the colon, possible constipation from narcotics, possible bowel, bladder or sexual dysfunction if having rectal surgery, prolonged fatigue/weakness or appetite loss, possible early recurrence of of disease, possible complications of their medical problems such as heart disease or arrhythmias or lung problems, death (less than 1%). I believe the patient understands and wishes to proceed with the surgery.

## 2015-12-05 ENCOUNTER — Encounter: Payer: Self-pay | Admitting: Hematology

## 2015-12-05 NOTE — Progress Notes (Signed)
Left message for hubby to advise once I get proof of radiation treatments I will fax to the company for him. Emailed [employer]

## 2015-12-06 ENCOUNTER — Telehealth: Payer: Self-pay | Admitting: Internal Medicine

## 2015-12-06 ENCOUNTER — Telehealth: Payer: Self-pay | Admitting: Pharmacist

## 2015-12-06 MED ORDER — HYDROCODONE-ACETAMINOPHEN 5-325 MG PO TABS: 1.0000 | ORAL_TABLET | Freq: Four times a day (QID) | ORAL | 0 refills | Status: DC | PRN

## 2015-12-06 NOTE — Telephone Encounter (Signed)
Done hardcopy to Corinne  

## 2015-12-06 NOTE — Telephone Encounter (Signed)
Patient is requesting a refill of HYDROcodone-acetaminophen (NORCO) 5-325 MG tablet [id number]

## 2015-12-06 NOTE — Telephone Encounter (Signed)
Oral Chemotherapy Clinic Follow-Up  Attempted to reach patient for follow up on oral medication: Xeloda. Pt was taking Xeloda in combination with radiation, which she completed 8/18.  No answer. Left VM for patient to call back with any questions or issues.   Thank you,  Nuala Alpha, PharmD Oral Chemotherapy Clinic [phone]

## 2015-12-07 NOTE — Telephone Encounter (Signed)
Called patient left message stating that medication was ready for pick up. Placed in front office.

## 2015-12-08 ENCOUNTER — Ambulatory Visit
Admission: RE | Admit: 2015-12-08 | Discharge: 2015-12-08 | Disposition: A | Discharge status: Home / Self Care | Payer: 59 | Source: Ambulatory Visit | Attending: Hematology | Admitting: Hematology

## 2015-12-08 ENCOUNTER — Ambulatory Visit: Admission: RE | Admit: 2015-12-08 | Payer: 59 | Source: Ambulatory Visit

## 2015-12-18 NOTE — Progress Notes (Signed)
  Radiation Oncology         [phone] ________________________________  Name: Karen Good MRN: [id number]  Date: 12/01/2015  DOB: December 24, 1955  End of Treatment Note  Diagnosis:   Rectal cancer    Rectal cancer [facility])   Staging form: Colon and Rectum, AJCC 7th Edition   - Clinical: Stage IIA (T3, N0, M0) - Signed by [name] Merle, MD on 10/10/2015  Indication for treatment:  Curative       Radiation treatment dates:   10/25/2015 through 12/01/2015  Site/dose:    The patient was treated to the pelvis to a dose of 45 Gy at 1.8 Gy per fraction. This was accomplished using a 4 field 3-D conformal technique. The patient then received a boost to the tumor and adjacent high-risk regions for an additional 5.4 Gy at 1.8 gray per fraction. This was carried out using a coned-down 4 field approach. The patient's total dose was 50.4 Gy. Daily AlignRT was used on a daily basis to insure proper patient positioning and localization of critical targets/ structures. The patient received concurrent chemotherapy during the course of radiation treatment.  Narrative: The patient tolerated radiation treatment relatively well.     Plan: The patient has completed radiation treatment. The patient will return to radiation oncology clinic for routine followup in one month. I advised the patient to call or return sooner if they have any questions or concerns related to their recovery or treatment.   ------------------------------------------------  Jodelle Gross, MD, PhD

## 2015-12-22 ENCOUNTER — Other Ambulatory Visit (HOSPITAL_BASED_OUTPATIENT_CLINIC_OR_DEPARTMENT_OTHER): Payer: 59

## 2015-12-22 ENCOUNTER — Ambulatory Visit (HOSPITAL_BASED_OUTPATIENT_CLINIC_OR_DEPARTMENT_OTHER): Payer: 59 | Admitting: Hematology

## 2015-12-22 VITALS — BP 162/67 | HR 111 | Temp 97.9°F | Resp 18 | Ht 65.5 in | Wt 159.3 lb

## 2015-12-22 DIAGNOSIS — K922 Gastrointestinal hemorrhage, unspecified: Secondary | ICD-10-CM

## 2015-12-22 DIAGNOSIS — D500 Iron deficiency anemia secondary to blood loss (chronic): Secondary | ICD-10-CM | POA: Diagnosis not present

## 2015-12-22 DIAGNOSIS — E119 Type 2 diabetes mellitus without complications: Secondary | ICD-10-CM

## 2015-12-22 DIAGNOSIS — D509 Iron deficiency anemia, unspecified: Secondary | ICD-10-CM

## 2015-12-22 DIAGNOSIS — C20 Malignant neoplasm of rectum: Secondary | ICD-10-CM

## 2015-12-22 DIAGNOSIS — F411 Generalized anxiety disorder: Secondary | ICD-10-CM

## 2015-12-22 DIAGNOSIS — N63 Unspecified lump in breast: Secondary | ICD-10-CM

## 2015-12-22 DIAGNOSIS — I10 Essential (primary) hypertension: Secondary | ICD-10-CM

## 2015-12-22 LAB — IRON AND TIBC
%SAT: 15 % — AB (ref 21–57)
IRON: 67 ug/dL (ref 41–142)
TIBC: 434 ug/dL (ref 236–444)
UIBC: 367 ug/dL (ref 120–384)

## 2015-12-22 LAB — COMPREHENSIVE METABOLIC PANEL
ALT: 22 U/L (ref 0–55)
ANION GAP: 11 meq/L (ref 3–11)
AST: 16 U/L (ref 5–34)
Albumin: 4 g/dL (ref 3.5–5.0)
Alkaline Phosphatase: 66 U/L (ref 40–150)
BUN: 12.2 mg/dL (ref 7.0–26.0)
CALCIUM: 9.6 mg/dL (ref 8.4–10.4)
CHLORIDE: 104 meq/L (ref 98–109)
CO2: 27 meq/L (ref 22–29)
Creatinine: 0.6 mg/dL (ref 0.6–1.1)
Glucose: 87 mg/dl (ref 70–140)
POTASSIUM: 3.4 meq/L — AB (ref 3.5–5.1)
Sodium: 142 mEq/L (ref 136–145)
Total Bilirubin: 0.93 mg/dL (ref 0.20–1.20)
Total Protein: 7.4 g/dL (ref 6.4–8.3)

## 2015-12-22 LAB — CBC WITH DIFFERENTIAL/PLATELET
BASO%: 0.3 % (ref 0.0–2.0)
Basophils Absolute: 0 10*3/uL (ref 0.0–0.1)
EOS%: 0.7 % (ref 0.0–7.0)
Eosinophils Absolute: 0 10*3/uL (ref 0.0–0.5)
HCT: 33.2 % — ABNORMAL LOW (ref 34.8–46.6)
HGB: 10.6 g/dL — ABNORMAL LOW (ref 11.6–15.9)
LYMPH%: 8.4 % — ABNORMAL LOW (ref 14.0–49.7)
MCH: 27.1 pg (ref 25.1–34.0)
MCHC: 31.9 g/dL (ref 31.5–36.0)
MCV: 85 fL (ref 79.5–101.0)
MONO#: 0.3 10*3/uL (ref 0.1–0.9)
MONO%: 9.6 % (ref 0.0–14.0)
NEUT#: 2.7 10*3/uL (ref 1.5–6.5)
NEUT%: 81 % — ABNORMAL HIGH (ref 38.4–76.8)
Platelets: 274 10*3/uL (ref 145–400)
RBC: 3.91 10*6/uL (ref 3.70–5.45)
RDW: 29.9 % — ABNORMAL HIGH (ref 11.2–14.5)
WBC: 3.3 10*3/uL — ABNORMAL LOW (ref 3.9–10.3)
lymph#: 0.3 10*3/uL — ABNORMAL LOW (ref 0.9–3.3)

## 2015-12-22 LAB — FERRITIN: FERRITIN: 128 ng/mL (ref 9–269)

## 2015-12-22 MED ORDER — POTASSIUM CHLORIDE CRYS ER 20 MEQ PO TBCR: 20.0000 meq | EXTENDED_RELEASE_TABLET | Freq: Every day | ORAL | 0 refills | Status: DC

## 2015-12-22 NOTE — Progress Notes (Signed)
[place]  [phone] [phone]) [phone]  Clinic Follow up Note   Patient Care Team: Biagio Borg, MD as PCP - General Kyung Rudd, MD as Consulting Physician (Radiation Oncology) Leighton Ruff, MD as Consulting Physician (General Surgery) Milus Banister, MD as Attending Physician (Gastroenterology) Tania Ade, RN as Registered Nurse 12/22/2015   CHIEF COMPLAINTS:  Follow up rectal cancer  Oncology History   Presented with iron deficiency anemia and hematochezia x 1 month  Rectal cancer [facility])   Staging form: Colon and Rectum, AJCC 7th Edition     Clinical: Stage IIA (T3, N0, M0) - Signed by Karen Merle, MD on 10/10/2015       Rectal cancer ([place])   09/22/2015 Imaging    CT ABD/PELVIS: Soft tissue fullness at the rectosigmoid junction. Otherwise, no acute process in the abdomen or pelvis      09/24/2015 Pathology Results    Adenocarcinoma      09/24/2015 Initial Diagnosis    Rectal cancer ([place])      09/24/2015 Procedure    COLONOSCOPY: Nonobsructive mass in rectosigmoid colon from 10-20 cum, circumferential (Dr. Collene Mares)      09/24/2015 Tumor Marker    CEA=4.1      09/25/2015 Imaging    CT CHEST: Mass within the upper-outer quadrant of the left breast warranting further evaluation. No suspicious pulmonary abnormality      10/25/2015 - 12/01/2015 Radiation Therapy    adjuvant irradiation to rectal cancer      10/25/2015 - 12/01/2015 Chemotherapy    Xeloda 1500 mg twice daily, with concurrent irradiation     ' HISTORY OF PRESENTING ILLNESS:  Karen Good 60 y.o. female is here because of her newly diagnosed rectal cancer. She presents to my clinic with her husband.  She had had intermittent bloody stool for one month, somewhat mount, mixed with stool, with worsening fatigue, and 6 lbs weight loss, no significant abdominal pain, nausea, dyspnea, or other symptoms. She was seen by her PCP Dr. Jenny Reichmann and lab work showed anemia. She was sent  to ED on 6/9 and received blood transfusion for Hb 6.9. She underwent a colonoscopy which showed a partially obstructive proximal rectal mass, biopsy showed adenocarcinoma. She was subsequently referred to Dr. Ardis Hughs and underwent EUS which showed a T3 N0 rectal mass. She was seen by [place] Dr. Marcello Good last week, and is also scheduled to see radiation oncologist Dr. Lisbeth Good tomorrow.  She has felt much better overall after blood transfusion. She did receive IV ferric gluconate 125 mg in the hospital, but is not on oral iron supplements. She feels well overall, has good appetite and energy level, but does feel anxious since her cancer diagnosis.  CURRENT THERAPY: pending rectal cancer surgery   INTERIM HISTORY: [name] returns for follow-up. She is accompanied by her husband to my clinic today. She is doing well, has recovered well from chemotherapy and radiation. She is scheduled for rectal cancer surgery on 01/31/2016.   MEDICAL HISTORY:  Past Medical History:  Diagnosis Date  . BACK PAIN 04/13/2010  . BACK STRAIN, LUMBAR 04/13/2010  . DIABETES MELLITUS, TYPE II 06/02/2008  . HYPERLIPIDEMIA 06/02/2008  . HYPERTENSION 06/02/2008    SURGICAL HISTORY: Past Surgical History:  Procedure Laterality Date  . COLONOSCOPY Left 09/24/2015   Procedure: COLONOSCOPY;  [place]: Karen Craver, MD;  Location: [facility] ENDOSCOPY;  Service: Endoscopy;  Laterality: Left;  . ESOPHAGOGASTRODUODENOSCOPY N/A 09/24/2015   Procedure: ESOPHAGOGASTRODUODENOSCOPY (EGD);  [place]: Karen Craver, MD;  Location: [facility] ENDOSCOPY;  Service: Endoscopy;  Laterality: N/A;  . EUS N/A 10/05/2015   Procedure: LOWER ENDOSCOPIC ULTRASOUND (EUS);  [place]: Milus Banister, MD;  Location: [facility] ENDOSCOPY;  Service: Endoscopy;  Laterality: N/A;  . s/p breast biopsy  4/07   benign    SOCIAL HISTORY: Social History   Social History  . Marital status: Single    Spouse name: N/A  . Number of children: 2  . Years of education: N/A   Occupational  History  .  [employer]   Social History Main Topics  . Smoking status: Never Smoker  . Smokeless tobacco: Never Used  . Alcohol use No  . Drug use: No  . Sexual activity: Yes    Birth control/ protection: Post-menopausal   Other Topics Concern  . Not on file   Social History Narrative  . No narrative on file    FAMILY HISTORY: Family History  Problem Relation Age of Onset  . Diabetes Mother   . Heart disease Father   . Diabetes Father   . Hypertension Father   . Diabetes Sister   . Diabetes Other   . Diabetes Brother     ALLERGIES:  has No Known Allergies.  MEDICATIONS:  Current Outpatient Prescriptions  Medication Sig Dispense Refill  . amLODipine (NORVASC) 10 MG tablet Take 1 tablet (10 mg total) by mouth daily. 30 tablet 10  . atenolol (TENORMIN) 25 MG tablet Take 0.5 tablets (12.5 mg total) by mouth 2 (two) times daily. 60 tablet 0  . atorvastatin (LIPITOR) 20 MG tablet Take 1 tablet (20 mg total) by mouth daily. 90 tablet 3  . capecitabine (XELODA) 500 MG tablet Take 3 tablets (1,500 mg total) by mouth 2 (two) times daily after a meal. 90 tablet 1  . clonazePAM (KLONOPIN) 0.5 MG tablet Take 1 tablet (0.5 mg total) by mouth 2 (two) times daily as needed (anxiety). (Patient not taking: Reported on 12/01/2015) 30 tablet 0  . GLIPIZIDE XL 5 MG 24 hr tablet TAKE TWO TABLETS BY MOUTH DAILY 180 tablet 2  . hydrochlorothiazide (HYDRODIURIL) 25 MG tablet Take 1 tablet (25 mg total) by mouth daily. 30 tablet 5  . HYDROcodone-acetaminophen (NORCO) 5-325 MG tablet Take 1 tablet by mouth every 6 (six) hours as needed for moderate pain. 30 tablet 0  . irbesartan (AVAPRO) 300 MG tablet Take 1 tablet (300 mg total) by mouth at bedtime. 90 tablet 3  . ondansetron (ZOFRAN) 8 MG tablet Take 1 tablet (8 mg total) by mouth every 8 (eight) hours as needed for nausea. 30 tablet 3  . potassium chloride SA (K-DUR,KLOR-CON) 20 MEQ tablet Take 1 tablet (20 mEq total) by mouth daily. 30  tablet 0  . prochlorperazine (COMPAZINE) 10 MG tablet Take 1 tablet (10 mg total) by mouth every 6 (six) hours as needed for nausea or vomiting. 30 tablet 0  . sitaGLIPtin-metformin (JANUMET) 50-1000 MG tablet Take 1 tablet by mouth 2 (two) times daily. 180 tablet 3   No current facility-administered medications for this visit.     REVIEW OF SYSTEMS:   Constitutional: Denies fevers, chills or abnormal night sweats Eyes: Denies blurriness of vision, double vision or watery eyes Ears, nose, mouth, throat, and face: Denies mucositis or sore throat Respiratory: Denies cough, dyspnea or wheezes Cardiovascular: Denies palpitation, chest discomfort or lower extremity swelling Gastrointestinal:  Denies nausea, heartburn or change in bowel habits Skin: Denies abnormal skin rashes Lymphatics: Denies new lymphadenopathy or easy bruising Neurological:Denies numbness,  tingling or new weaknesses Behavioral/Psych: Mood is stable, no new changes  All other systems were reviewed with the patient and are negative.  PHYSICAL EXAMINATION: ECOG PERFORMANCE STATUS: 0 - Asymptomatic  Vitals:   12/22/15 1444  BP: (!) 162/67  Pulse: (!) 111  Resp: 18  Temp: 97.9 F (36.6 C)   Filed Weights   12/22/15 1444  Weight: 159 lb 4.8 oz (72.3 kg)    GENERAL:alert, no distress and comfortable SKIN: skin color, texture, turgor are normal, no rashes or significant lesions EYES: normal, conjunctiva are pink and non-injected, sclera clear OROPHARYNX:no exudate, no erythema and lips, buccal mucosa, and tongue normal  NECK: supple, thyroid normal size, non-tender, without nodularity LYMPH:  no palpable lymphadenopathy in the cervical, axillary or inguinal LUNGS: clear to auscultation and percussion with normal breathing effort HEART: regular rate & rhythm, 2/6 systolic murmurs, no lower extremity edema ABDOMEN:abdomen soft, non-tender and normal bowel sounds, no organomegaly. Rectal exam was not  performed. Musculoskeletal:no cyanosis of digits and no clubbing  PSYCH: alert & oriented x 3 with fluent speech NEURO: no focal motor/sensory deficits Breast exam: deferred   LABORATORY DATA:  I have reviewed the data as listed CBC Latest Ref Rng & Units 12/22/2015 11/28/2015 11/21/2015  WBC 3.9 - 10.3 10e3/uL 3.3(L) 3.1(L) 2.4(L)  Hemoglobin 11.6 - 15.9 g/dL 10.6(L) 9.9(L) 10.2(L)  Hematocrit 34.8 - 46.6 % 33.2(L) 31.9(L) 32.3(L)  Platelets 145 - 400 10e3/uL 274 275 291   CMP Latest Ref Rng & Units 12/22/2015 11/28/2015 11/21/2015  Glucose 70 - 140 mg/dl 87 130 127  BUN 7.0 - 26.0 mg/dL 12.2 12.2 14.4  Creatinine 0.6 - 1.1 mg/dL 0.6 0.7 0.8  Sodium 136 - 145 mEq/L 142 141 142  Potassium 3.5 - 5.1 mEq/L 3.4(L) 3.3(L) 3.1(L)  Chloride 101 - 111 mmol/L - - -  CO2 22 - 29 mEq/L 27 29 30(H)  Calcium 8.4 - 10.4 mg/dL 9.6 9.9 10.0  Total Protein 6.4 - 8.3 g/dL 7.4 7.2 7.4  Total Bilirubin 0.20 - 1.20 mg/dL 0.93 0.87 0.75  Alkaline Phos 40 - 150 U/L 66 67 62  AST 5 - 34 U/L 16 21 13   ALT 0 - 55 U/L 22 25 12    ANC 1.8 today    PATHOLOGY REPORT  Diagnosis 09/24/2015 Colon, biopsy, Rectal sigmoid mass - ADENOCARCINOMA. - SEE COMMENT. Microscopic Comment Dr. Orene Desanctis has reviewed the case and concurs with this interpretation. Dr. Collene Mares was paged on 09/26/15. (JBK:gt, 09/26/15)   RADIOGRAPHIC STUDIES: I have personally reviewed the radiological images as listed and agreed with the findings in the report. Mm Diag Breast Tomo Uni Left  Result Date: 12/12/2015 CLINICAL DATA:  60 year old female presenting for diagnostic evaluation for workup of a left breast mass which was seen on a recent chest CT. EXAM: 2D DIGITAL DIAGNOSTIC UNILATERAL LEFT MAMMOGRAM WITH CAD AND ADJUNCT TOMO COMPARISON:  Previous exam(s). ACR Breast Density Category b: There are scattered areas of fibroglandular density. FINDINGS: There is a 2 cm asymmetry in the upper-outer quadrant of the left breast which corresponds with the mass  identified on the patient's CT. This has been stable since at least 2011, consistent with a benign finding. No suspicious calcifications, masses or areas of distortion are seen in the left breast. Mammographic images were processed with CAD. IMPRESSION: The asymmetry in the upper-outer quadrant of the left breast corresponds with the finding on the patient's CT. This has been stable well over 2 years (since 2011), consistent with a benign  finding. RECOMMENDATION: Screening mammogram in one year.(Code:SM-B-01Y) I have discussed the findings and recommendations with the patient. Results were also provided in writing at the conclusion of the visit. If applicable, a reminder letter will be sent to the patient regarding the next appointment. BI-RADS CATEGORY  2: Benign. Electronically Signed   By: Ammie Ferrier M.D.   On: 12/12/2015 15:57    Diagnostic mammogram of left breast 12/12/2015 IMPRESSION: The asymmetry in the upper-outer quadrant of the left breast corresponds with the finding on the patient's CT. This has been stable well over 2 years (since 2011), consistent with a benign finding.  RECOMMENDATION: Screening mammogram in one year.(Code:SM-B-01Y)  I have discussed the findings and recommendations with the patient. Results were also provided in writing at the conclusion of the visit. If applicable, a reminder letter will be sent to the patient regarding the next appointment.  BI-RADS CATEGORY  2: Benign.  EUS 10/05/2015 Dr. Ardis Hughs  IMPRESSION:  Partially circumferential, 6cm long uT3N0 rectosigmoid adenocarcinoma with distal edge located 9cm from the anal verge. Submucosal injections of [place]k, at proximal and distal edges of the mass.  Colonoscopy and EGD 09/24/2015 Dr. Collene Mares  - Large circumferental polypoid mass in the rectosigmoid colon from 10-20 cm-biopsies Done. - Normal appearing, widely patent esophagus and GEJ. - Small hiatal hernia noted on retroflexion. - Mild  antral gastritis-otherwise normal appearing stomach. - Normal examined duodenum. - No specimens collected.  ASSESSMENT & PLAN: 60 year old female with past medical history of diabetes and hypertension, presented with rectal bleeding. Colonoscopy showed a large proximal rectal mass.  1. Rectal adenocarcinoma, proximal rectum, uT3N0M0, stage IIA -I have reviewed her colonoscopy, EUS, and CT of abdomen pelvis finding with pt in details -The EUS showed a T3 lesion, no suspicious lymphadenopathy. The CT scan showed no evidence of adenopathy or distant metastasis. She has stage IIA disease -We reviewed the natural history of rectal cancer and risk of recurrence after surgery. Given the stage II a disease, she likely has moderate risk of recurrence. We discussed the treatment option for stage IIA rectal cancer. The standard of care is neoadjuvant chemotherapy and radiation, followed by surgery, then adjuvant chemotherapy. -She completed neoadjuvant chemoradiation, tolerated it very well -She is scheduled for rectal cancer surgery in October -We discussed the role of adjuvant chemotherapy in rectal cancer, we'll decide her adjuvant chemotherapy regiment based on her surgical pathology findings. We will hold on the port placement for now -I'll plan to see her back 3 weeks after surgery  2. Iron deficient anemia secondary to GI bleeding -Her recent iron study was consistent with iron deficient anemia. -She has received blood transfusion and iv feraheme twice  -continue oral ferrous sulfate 1 tablet a day  3. Anxiety  -Secondary to cancer diagnosis. -Improved lately.  4. HTN and DM -Continue medication. She'll follow-up with her primary care physician -I discussed the impact of chemotherapy on her blood pressure and diabetes, we'll monitor her blood pressure and glucose level closely, and is just the medication if needed. -Her blood pressure has been high lately, I encouraged her to monitor at home  and follow-up with her primary care physician  5. Left breast mass -CT chest revealed a left breast mass, however her screening mammogram was negative in 09/2015 -Her diagnostic mammogram showed the left breast lesion has been stable for a few years, likely benign, no biopsy was recommended.  Plan -Rectal cancer surgery is scheduled for October 18 -I plan to see her back 3  weeks after her surgery   All questions were answered. The patient knows to call the clinic with any problems, questions or concerns.  I spent 20 minutes counseling the patient face to face. The total time spent in the appointment was 25 minutes and more than 50% was on counseling.     Karen Merle, MD 12/22/2015 7:45 AM

## 2015-12-24 ENCOUNTER — Other Ambulatory Visit: Payer: Self-pay | Admitting: Hematology

## 2015-12-24 ENCOUNTER — Encounter: Payer: Self-pay | Admitting: Hematology

## 2016-01-02 ENCOUNTER — Telehealth: Payer: Self-pay | Admitting: *Deleted

## 2016-01-02 NOTE — Telephone Encounter (Signed)
Requesting refill on her pain med "Hydrocodone".../lmb 

## 2016-01-03 MED ORDER — HYDROCODONE-ACETAMINOPHEN 5-325 MG PO TABS: 1.0000 | ORAL_TABLET | Freq: Four times a day (QID) | ORAL | 0 refills | Status: DC | PRN

## 2016-01-03 NOTE — Telephone Encounter (Signed)
pls advise on msg below.../lmb 

## 2016-01-03 NOTE — Telephone Encounter (Signed)
Done hardcopy to Corinne  

## 2016-01-04 NOTE — Telephone Encounter (Signed)
Patient aware ready for pickup 

## 2016-01-09 ENCOUNTER — Telehealth: Payer: Self-pay | Admitting: *Deleted

## 2016-01-09 NOTE — Telephone Encounter (Signed)
Received call from pt asking about getting flu shot tomorrow at work.   Called pt back & informed OK to get flu shot at work.

## 2016-01-16 ENCOUNTER — Encounter: Payer: Self-pay | Admitting: Radiation Oncology

## 2016-01-16 ENCOUNTER — Ambulatory Visit
Admission: RE | Admit: 2016-01-16 | Discharge: 2016-01-16 | Disposition: A | Discharge status: Home / Self Care | Payer: 59 | Source: Ambulatory Visit | Attending: Radiation Oncology | Admitting: Radiation Oncology

## 2016-01-16 DIAGNOSIS — Z7984 Long term (current) use of oral hypoglycemic drugs: Secondary | ICD-10-CM | POA: Insufficient documentation

## 2016-01-16 DIAGNOSIS — Z9221 Personal history of antineoplastic chemotherapy: Secondary | ICD-10-CM | POA: Insufficient documentation

## 2016-01-16 DIAGNOSIS — Z923 Personal history of irradiation: Secondary | ICD-10-CM | POA: Insufficient documentation

## 2016-01-16 DIAGNOSIS — C20 Malignant neoplasm of rectum: Secondary | ICD-10-CM | POA: Diagnosis present

## 2016-01-16 NOTE — Progress Notes (Signed)
Karen Good here today for FU S/P XRT to her colon/rectal region.  She denies any pain and reports "normal bowel habits".  Eating without any resulting nausea. To have surgery 01/31/15 at [facility]. No other vioced concerns  BP (!) 154/76   Pulse 99   Temp 98.2 F (36.8 C)   Ht 5' 5.5" (1.664 m)   Wt 164 lb 3.2 oz (74.5 kg)   BMI 26.91 kg/m    Wt Readings from Last 3 Encounters:  01/16/16 164 lb 3.2 oz (74.5 kg)  12/22/15 159 lb 4.8 oz (72.3 kg)  12/01/15 158 lb 4.8 oz (71.8 kg)

## 2016-01-18 NOTE — Progress Notes (Signed)
Radiation Oncology         [phone] ________________________________  Name: Karen Good MRN: [id number]  Date: 01/16/2016  DOB: [birth year]  Post Treatment Note  CC: Cathlean Cower, MD  Leighton Ruff, MD  Diagnosis:   Stage IIA, T3, N0, M0 adenocarcinoma of the rectum  Interval Since Last Radiation:  7 weeks   10/25/2015 through 12/01/2015: The patient was treated to the pelvis to a dose of 45 Gy at 1.8 Gy per fraction. This was accomplished using a 4 field 3-D conformal technique. The patient then received a boost to the tumor and adjacent high-risk regions for an additional 5.4 Gy at 1.8 gray per fraction. This was carried out using a coned-down 4 field approach. The patient's total dose was 50.4 Gy. Daily AlignRT was used on a daily basis to insure proper patient positioning and localization of critical targets/ structures. The patient received concurrent chemotherapy during the course of radiation treatment.  Narrative:  The patient returns today for routine follow-up. She tolerated radiotherapy very well during radiotherapy. She has completed oral chemotherapy as well and is planning to move forward with  Robotic partial colectomy on 01/31/16.                     On review of systems, the patient states she reports she is doing great. She denies any diarrhea, abdominal pain, nausea, or vomiting. She has gained a few pounds since completing treatment and is concerned about this. She denies fevers, chills, chest pain, or shortness of breath. No other complaints are noted.   ALLERGIES:  has No Known Allergies.  Meds: Current Outpatient Prescriptions  Medication Sig Dispense Refill  . amLODipine (NORVASC) 10 MG tablet Take 1 tablet (10 mg total) by mouth daily. 30 tablet 10  . atorvastatin (LIPITOR) 20 MG tablet Take 1 tablet (20 mg total) by mouth daily. 90 tablet 3  . GLIPIZIDE XL 5 MG 24 hr tablet TAKE TWO TABLETS BY MOUTH DAILY 180 tablet 2  . hydrochlorothiazide (HYDRODIURIL)  25 MG tablet Take 1 tablet (25 mg total) by mouth daily. 30 tablet 5  . HYDROcodone-acetaminophen (NORCO) 5-325 MG tablet Take 1 tablet by mouth every 6 (six) hours as needed for moderate pain. To last 30 days, to fill sept 22, 2017 30 tablet 0  . irbesartan (AVAPRO) 300 MG tablet Take 1 tablet (300 mg total) by mouth at bedtime. 90 tablet 3  . potassium chloride SA (K-DUR,KLOR-CON) 20 MEQ tablet Take 1 tablet (20 mEq total) by mouth daily. 30 tablet 0  . sitaGLIPtin-metformin (JANUMET) 50-1000 MG tablet Take 1 tablet by mouth 2 (two) times daily. 180 tablet 3  . atenolol (TENORMIN) 25 MG tablet Take 0.5 tablets (12.5 mg total) by mouth 2 (two) times daily. (Patient not taking: Reported on 01/16/2016) 60 tablet 0  . clonazePAM (KLONOPIN) 0.5 MG tablet Take 1 tablet (0.5 mg total) by mouth 2 (two) times daily as needed (anxiety). (Patient not taking: Reported on 01/16/2016) 30 tablet 0   No current facility-administered medications for this encounter.     Physical Findings:  height is 5' 5.5" (1.664 m) and weight is 164 lb 3.2 oz (74.5 kg). Her temperature is 98.2 F (36.8 C). Her blood pressure is 154/76 (abnormal) and her pulse is 99.  In general this is a well appearing African American female in no acute distress. She's alert and oriented x4 and appropriate throughout the examination. Cardiopulmonary assessment is negative for acute distress and she  exhibits normal effort.   Lab Findings: Lab Results  Component Value Date   WBC 3.3 (L) 12/22/2015   HGB 10.6 (L) 12/22/2015   HCT 33.2 (L) 12/22/2015   MCV 85.0 12/22/2015   PLT 274 12/22/2015     Radiographic Findings: No results found.  Impression/Plan: 1. Stage IIA, T3, N0, M0, adenocarcinoma of the rectum. The patinet is doing well since completing radiotherapy and will now move forward to the OR in the next three weeks for definitive robotic partial colectomy. She will return in about 9 months for follow up to begin surveillance in  our clinic, and will keep [place] informed of questions or concerns prior to her next visit.     Carola Rhine, PAC

## 2016-01-21 ENCOUNTER — Other Ambulatory Visit: Payer: Self-pay | Admitting: Internal Medicine

## 2016-01-22 ENCOUNTER — Other Ambulatory Visit: Payer: Self-pay | Admitting: Hematology

## 2016-01-22 DIAGNOSIS — E876 Hypokalemia: Secondary | ICD-10-CM

## 2016-01-22 DIAGNOSIS — C20 Malignant neoplasm of rectum: Secondary | ICD-10-CM

## 2016-01-29 ENCOUNTER — Encounter (HOSPITAL_COMMUNITY): Payer: Self-pay

## 2016-01-29 ENCOUNTER — Encounter (HOSPITAL_COMMUNITY)
Admission: RE | Admit: 2016-01-29 | Discharge: 2016-01-29 | Disposition: A | Discharge status: Home / Self Care | Payer: 59 | Source: Ambulatory Visit | Attending: General Surgery | Admitting: General Surgery

## 2016-01-29 ENCOUNTER — Other Ambulatory Visit: Payer: Self-pay | Admitting: *Deleted

## 2016-01-29 DIAGNOSIS — E119 Type 2 diabetes mellitus without complications: Secondary | ICD-10-CM

## 2016-01-29 DIAGNOSIS — Z01818 Encounter for other preprocedural examination: Secondary | ICD-10-CM

## 2016-01-29 DIAGNOSIS — I10 Essential (primary) hypertension: Secondary | ICD-10-CM

## 2016-01-29 HISTORY — DX: Malignant (primary) neoplasm, unspecified: C80.1

## 2016-01-29 HISTORY — DX: Personal history of irradiation: Z92.3

## 2016-01-29 HISTORY — DX: Personal history of other medical treatment: Z92.89

## 2016-01-29 HISTORY — DX: Personal history of antineoplastic chemotherapy: Z92.21

## 2016-01-29 HISTORY — DX: Anemia, unspecified: D64.9

## 2016-01-29 LAB — BASIC METABOLIC PANEL
Anion gap: 10 (ref 5–15)
BUN: 11 mg/dL (ref 6–20)
CALCIUM: 9.9 mg/dL (ref 8.9–10.3)
CO2: 29 mmol/L (ref 22–32)
CREATININE: 0.55 mg/dL (ref 0.44–1.00)
Chloride: 104 mmol/L (ref 101–111)
GFR calc non Af Amer: >60 mL/min (ref >60)
GLUCOSE: 143 mg/dL — AB (ref 65–99)
Potassium: 3.8 mmol/L (ref 3.5–5.1)
Sodium: 143 mmol/L (ref 135–145)

## 2016-01-29 LAB — CBC
HCT: 34.3 % — ABNORMAL LOW (ref 36.0–46.0)
Hemoglobin: 11.3 g/dL — ABNORMAL LOW (ref 12.0–15.0)
MCH: 28.5 pg (ref 26.0–34.0)
MCHC: 32.9 g/dL (ref 30.0–36.0)
MCV: 86.6 fL (ref 78.0–100.0)
PLATELETS: 279 10*3/uL (ref 150–400)
RBC: 3.96 MIL/uL (ref 3.87–5.11)
RDW: 16.9 % — AB (ref 11.5–15.5)
WBC: 4.9 10*3/uL (ref 4.0–10.5)

## 2016-01-29 LAB — ABO/RH: ABO/RH(D): A POS

## 2016-01-29 LAB — GLUCOSE, CAPILLARY: Glucose-Capillary: 165 mg/dL — ABNORMAL HIGH (ref 65–99)

## 2016-01-29 MED ORDER — HEPARIN SODIUM (PORCINE) 5000 UNIT/ML IJ SOLN: 5000.0000 [IU] | Freq: Once | INTRAMUSCULAR | Status: DC

## 2016-01-29 MED ORDER — ATENOLOL 25 MG PO TABS: 12.5000 mg | ORAL_TABLET | Freq: Two times a day (BID) | ORAL | 2 refills | Status: DC

## 2016-01-29 NOTE — Progress Notes (Signed)
Spoke with Dr Jerilynn Mages Judd/anesthesia in regards to CT Chest results from 09/25/2015. No orders given. Anesthesia to see pt day of surgery.

## 2016-01-29 NOTE — Patient Instructions (Signed)
Karen Good  01/29/2016   Your procedure is scheduled on: Wednesday January 31, 2016  Report to [facility] Main  Entrance take [place]  elevators to 3rd floor to  [place] at 6:30 AM.  Call this number if you have problems the morning of surgery [phone]   Remember: ONLY 1 PERSON MAY GO WITH YOU TO SHORT STAY TO GET  READY MORNING OF [place].  Do not eat food or drink liquids :After Midnight.     Take these medicines the morning of surgery with A SIP OF WATER:Amlodipine; May take hydrocodone-acetaminophen if needed  How to Manage Your Diabetes Before and After Surgery  Why is it important to control my blood sugar before and after surgery? . Improving blood sugar levels before and after surgery helps healing and can limit problems. . A way of improving blood sugar control is eating a healthy diet by: o  Eating less sugar and carbohydrates o  Increasing activity/exercise o  Talking with your doctor about reaching your blood sugar goals . High blood sugars (greater than 180 mg/dL) can raise your risk of infections and slow your recovery, so you will need to focus on controlling your diabetes during the weeks before surgery. . Make sure that the doctor who takes care of your diabetes knows about your planned surgery including the date and location.  How do I manage my blood sugar before surgery? . Check your blood sugar at least 4 times a day, starting 2 days before surgery, to make sure that the level is not too high or low. o Check your blood sugar the morning of your surgery when you wake up and every 2 hours until you get to the Short Stay unit. . If your blood sugar is less than 70 mg/dL, you will need to treat for low blood sugar: o Do not take insulin. o Treat a low blood sugar (less than 70 mg/dL) with  cup of clear juice (cranberry or apple), 4 glucose tablets, OR glucose gel. o Recheck blood sugar in 15 minutes after treatment (to  make sure it is greater than 70 mg/dL). If your blood sugar is not greater than 70 mg/dL on recheck, call [phone] for further instructions. . Report your blood sugar to the short stay nurse when you get to Short Stay.  . If you are admitted to the hospital after surgery: o Your blood sugar will be checked by the staff and you will probably be given insulin after surgery (instead of oral diabetes medicines) to make sure you have good blood sugar levels. o The goal for blood sugar control after surgery is 80-180 mg/dL.   WHAT DO I DO ABOUT MY DIABETES MEDICATION?   Marland Kitchen Do not take oral diabetes medicines (pills) the morning of surgery.  .   Patient Signature:  Date:   Nurse Signature:  Date:   Reviewed and Endorsed by [facility] Patient Education Committee, August 2015         DO NOT TAKE ANY DIABETIC MEDICATIONS DAY OF YOUR SURGERY  (DO NOT TAKE GLIPIZIDE EVENING PRIOR TO SURGERY)                               You may not have any metal on your body including hair pins and  piercings  Do not wear jewelry, make-up, lotions, powders or perfumes, deodorant             Do not wear nail polish.  Do not shave  48 hours prior to surgery.          Do not bring valuables to the hospital. [place].  Contacts, dentures or bridgework may not be worn into surgery.  Leave suitcase in the car. After surgery it may be brought to your room.    Special Instructions: FOLLOW Goyal'S INSTRUCTION IN REGARDS TO BOWEL PREPARATION PRIOR TO SURGICAL PROCEDURE DATE             _____________________________________________________________________             [facility] - Preparing for Surgery Before surgery, you can play an important role.  Because skin is not sterile, your skin needs to be as free of germs as possible.  You can reduce the number of germs on your skin by washing with CHG (chlorahexidine gluconate) soap before surgery.   CHG is an antiseptic cleaner which kills germs and bonds with the skin to continue killing germs even after washing. Please DO NOT use if you have an allergy to CHG or antibacterial soaps.  If your skin becomes reddened/irritated stop using the CHG and inform your nurse when you arrive at Short Stay. Do not shave (including legs and underarms) for at least 48 hours prior to the first CHG shower.  You may shave your face/neck. Please follow these instructions carefully:  1.  Shower with CHG Soap the night before surgery and the  morning of Surgery.  2.  If you choose to wash your hair, wash your hair first as usual with your  normal  shampoo.  3.  After you shampoo, rinse your hair and body thoroughly to remove the  shampoo.                           4.  Use CHG as you would any other liquid soap.  You can apply chg directly  to the skin and wash                       Gently with a scrungie or clean washcloth.  5.  Apply the CHG Soap to your body ONLY FROM THE NECK DOWN.   Do not use on face/ open                           Wound or open sores. Avoid contact with eyes, ears mouth and genitals (private parts).                       Wash face,  Genitals (private parts) with your normal soap.             6.  Wash thoroughly, paying special attention to the area where your surgery  will be performed.  7.  Thoroughly rinse your body with warm water from the neck down.  8.  DO NOT shower/wash with your normal soap after using and rinsing off  the CHG Soap.                9.  Pat yourself dry with a clean towel.  10.  Wear clean pajamas.            11.  Place clean sheets on your bed the night of your first shower and do not  sleep with pets. Day of Surgery : Do not apply any lotions/deodorants the morning of surgery.  Please wear clean clothes to the hospital/surgery center.  FAILURE TO FOLLOW THESE INSTRUCTIONS MAY RESULT IN THE CANCELLATION OF YOUR SURGERY PATIENT  SIGNATURE_________________________________  NURSE SIGNATURE__________________________________  ________________________________________________________________________

## 2016-01-29 NOTE — Telephone Encounter (Signed)
Pt left msg on triage stating will need a refill on her atenolol. Having surgery on wed morning and advise to take prior to procedure. [facility] to [facility]..called pt back to verify pharmacy inform will send to [facility]../lmb

## 2016-01-29 NOTE — Progress Notes (Signed)
Dr Jerilynn Mages Judd/anesthesia reviewed pts EKGs from today 01/29/2016 and 02/09/2014/epic. Questioning of pt to continue on atenolol. Dr Jeneen Rinks / PCP out of the office till Wednesday. Did contact [facility]; spoke with Estill Bamberg who verified pt did not have further prescription refills since July 2017. Pt to be re evaluated on Wednesday 01/29/2016 per Anesthesia.

## 2016-01-30 LAB — HEMOGLOBIN A1C
HEMOGLOBIN A1C: 6.1 % — AB (ref 4.8–5.6)
MEAN PLASMA GLUCOSE: 128 mg/dL

## 2016-01-30 NOTE — Progress Notes (Signed)
Pt states she has received atenolol prescription; pt is aware to take morning of surgery. Pt states began prescription 01/29/2016.

## 2016-01-31 ENCOUNTER — Inpatient Hospital Stay (HOSPITAL_COMMUNITY)
Admission: RE | Admit: 2016-01-31 | Discharge: 2016-02-04 | DRG: 334 | Disposition: A | Discharge status: Home / Self Care | Payer: 59 | Source: Ambulatory Visit | Attending: General Surgery | Admitting: General Surgery

## 2016-01-31 ENCOUNTER — Encounter (HOSPITAL_COMMUNITY): Payer: Self-pay | Admitting: Certified Registered"

## 2016-01-31 ENCOUNTER — Inpatient Hospital Stay (HOSPITAL_COMMUNITY): Payer: 59 | Admitting: Anesthesiology

## 2016-01-31 ENCOUNTER — Encounter (HOSPITAL_COMMUNITY)
Admission: RE | Disposition: A | Discharge status: Home / Self Care | Payer: Self-pay | Source: Ambulatory Visit | Attending: General Surgery

## 2016-01-31 DIAGNOSIS — C20 Malignant neoplasm of rectum: Secondary | ICD-10-CM | POA: Diagnosis present

## 2016-01-31 DIAGNOSIS — I10 Essential (primary) hypertension: Secondary | ICD-10-CM | POA: Diagnosis present

## 2016-01-31 DIAGNOSIS — E876 Hypokalemia: Secondary | ICD-10-CM | POA: Diagnosis present

## 2016-01-31 DIAGNOSIS — Z8249 Family history of ischemic heart disease and other diseases of the circulatory system: Secondary | ICD-10-CM

## 2016-01-31 DIAGNOSIS — Z833 Family history of diabetes mellitus: Secondary | ICD-10-CM

## 2016-01-31 LAB — GLUCOSE, CAPILLARY
GLUCOSE-CAPILLARY: 183 mg/dL — AB (ref 65–99)
GLUCOSE-CAPILLARY: 185 mg/dL — AB (ref 65–99)
GLUCOSE-CAPILLARY: 169 mg/dL — AB (ref 65–99)
Glucose-Capillary: 142 mg/dL — ABNORMAL HIGH (ref 65–99)
Glucose-Capillary: 184 mg/dL — ABNORMAL HIGH (ref 65–99)

## 2016-01-31 LAB — TYPE AND SCREEN
ABO/RH(D): A POS
Antibody Screen: NEGATIVE

## 2016-01-31 SURGERY — COLECTOMY, PARTIAL, ROBOT-ASSISTED, LAPAROSCOPIC
Anesthesia: General

## 2016-01-31 MED ORDER — PROPOFOL 10 MG/ML IV BOLUS
INTRAVENOUS | Status: AC
  Filled 2016-01-31: qty 20

## 2016-01-31 MED ORDER — POTASSIUM CHLORIDE CRYS ER 20 MEQ PO TBCR
20.0000 meq | EXTENDED_RELEASE_TABLET | Freq: Every day | ORAL | Status: DC
  Administered 2016-01-31 – 2016-02-04 (×4): 20 meq via ORAL
  Filled 2016-01-31 (×4): qty 1

## 2016-01-31 MED ORDER — LIDOCAINE 2% (20 MG/ML) 5 ML SYRINGE
INTRAMUSCULAR | Status: AC
  Filled 2016-01-31: qty 5

## 2016-01-31 MED ORDER — DEXAMETHASONE SODIUM PHOSPHATE 10 MG/ML IJ SOLN
INTRAMUSCULAR | Status: DC | PRN
  Administered 2016-01-31: 10 mg via INTRAVENOUS

## 2016-01-31 MED ORDER — LACTATED RINGERS IV SOLN
INTRAVENOUS | Status: DC
  Administered 2016-01-31: 14:00:00 via INTRAVENOUS

## 2016-01-31 MED ORDER — HYDROMORPHONE HCL 1 MG/ML IJ SOLN
0.2500 mg | INTRAMUSCULAR | Status: DC | PRN
  Administered 2016-01-31 (×3): 0.5 mg via INTRAVENOUS

## 2016-01-31 MED ORDER — PROPOFOL 10 MG/ML IV BOLUS
INTRAVENOUS | Status: DC | PRN
  Administered 2016-01-31: 160 mg via INTRAVENOUS

## 2016-01-31 MED ORDER — ONDANSETRON HCL 4 MG PO TABS: 4.0000 mg | ORAL_TABLET | Freq: Four times a day (QID) | ORAL | Status: DC | PRN

## 2016-01-31 MED ORDER — ATENOLOL 12.5 MG HALF TABLET
12.5000 mg | ORAL_TABLET | Freq: Two times a day (BID) | ORAL | Status: DC
  Administered 2016-02-01 – 2016-02-04 (×4): 12.5 mg via ORAL
  Filled 2016-01-31 (×10): qty 1

## 2016-01-31 MED ORDER — CELECOXIB 200 MG PO CAPS
400.0000 mg | ORAL_CAPSULE | ORAL | Status: AC
  Administered 2016-01-31: 400 mg via ORAL
  Filled 2016-01-31: qty 2

## 2016-01-31 MED ORDER — IRBESARTAN 300 MG PO TABS
300.0000 mg | ORAL_TABLET | Freq: Every day | ORAL | Status: DC
  Administered 2016-02-01 – 2016-02-03 (×3): 300 mg via ORAL
  Filled 2016-01-31 (×4): qty 1

## 2016-01-31 MED ORDER — HEPARIN SODIUM (PORCINE) 5000 UNIT/ML IJ SOLN
INTRAMUSCULAR | Status: AC
  Filled 2016-01-31: qty 1

## 2016-01-31 MED ORDER — FENTANYL CITRATE (PF) 250 MCG/5ML IJ SOLN
INTRAMUSCULAR | Status: AC
  Filled 2016-01-31: qty 5

## 2016-01-31 MED ORDER — SODIUM CHLORIDE 0.9 % IR SOLN
Status: DC | PRN
  Administered 2016-01-31: 1000 mL

## 2016-01-31 MED ORDER — PHENYLEPHRINE 40 MCG/ML (10ML) SYRINGE FOR IV PUSH (FOR BLOOD PRESSURE SUPPORT)
PREFILLED_SYRINGE | INTRAVENOUS | Status: DC | PRN
  Administered 2016-01-31 (×3): 80 ug via INTRAVENOUS
  Administered 2016-01-31: 40 ug via INTRAVENOUS
  Administered 2016-01-31: 80 ug via INTRAVENOUS

## 2016-01-31 MED ORDER — ROCURONIUM BROMIDE 10 MG/ML (PF) SYRINGE
PREFILLED_SYRINGE | INTRAVENOUS | Status: DC | PRN
  Administered 2016-01-31: 20 mg via INTRAVENOUS
  Administered 2016-01-31 (×2): 10 mg via INTRAVENOUS
  Administered 2016-01-31: 50 mg via INTRAVENOUS

## 2016-01-31 MED ORDER — ALVIMOPAN 12 MG PO CAPS
12.0000 mg | ORAL_CAPSULE | Freq: Two times a day (BID) | ORAL | Status: DC
  Administered 2016-02-01 – 2016-02-03 (×5): 12 mg via ORAL
  Filled 2016-01-31 (×5): qty 1

## 2016-01-31 MED ORDER — LINAGLIPTIN 5 MG PO TABS
5.0000 mg | ORAL_TABLET | Freq: Every day | ORAL | Status: DC
  Administered 2016-02-02 – 2016-02-04 (×3): 5 mg via ORAL
  Filled 2016-01-31 (×3): qty 1

## 2016-01-31 MED ORDER — SITAGLIPTIN PHOS-METFORMIN HCL 50-1000 MG PO TABS: 1.0000 | ORAL_TABLET | Freq: Two times a day (BID) | ORAL | Status: DC

## 2016-01-31 MED ORDER — METFORMIN HCL 500 MG PO TABS
1000.0000 mg | ORAL_TABLET | Freq: Two times a day (BID) | ORAL | Status: DC
  Administered 2016-02-02 – 2016-02-04 (×5): 1000 mg via ORAL
  Filled 2016-01-31 (×5): qty 2

## 2016-01-31 MED ORDER — LACTATED RINGERS IR SOLN
Status: DC | PRN
  Administered 2016-01-31: 1000 mL

## 2016-01-31 MED ORDER — ACETAMINOPHEN 500 MG PO TABS
1000.0000 mg | ORAL_TABLET | Freq: Four times a day (QID) | ORAL | Status: AC
  Administered 2016-01-31 – 2016-02-01 (×4): 1000 mg via ORAL
  Filled 2016-01-31 (×4): qty 2

## 2016-01-31 MED ORDER — INSULIN ASPART 100 UNIT/ML ~~LOC~~ SOLN
0.0000 [IU] | Freq: Three times a day (TID) | SUBCUTANEOUS | Status: DC
  Administered 2016-01-31 – 2016-02-01 (×2): 4 [IU] via SUBCUTANEOUS
  Administered 2016-02-01: 7 [IU] via SUBCUTANEOUS
  Administered 2016-02-01 – 2016-02-03 (×6): 4 [IU] via SUBCUTANEOUS
  Administered 2016-02-03 – 2016-02-04 (×2): 3 [IU] via SUBCUTANEOUS

## 2016-01-31 MED ORDER — ONDANSETRON HCL 4 MG/2ML IJ SOLN: 4.0000 mg | Freq: Four times a day (QID) | INTRAMUSCULAR | Status: DC | PRN

## 2016-01-31 MED ORDER — ACETAMINOPHEN 500 MG PO TABS
1000.0000 mg | ORAL_TABLET | ORAL | Status: AC
  Administered 2016-01-31: 1000 mg via ORAL
  Filled 2016-01-31: qty 2

## 2016-01-31 MED ORDER — FENTANYL CITRATE (PF) 100 MCG/2ML IJ SOLN
INTRAMUSCULAR | Status: DC | PRN
  Administered 2016-01-31: 100 ug via INTRAVENOUS
  Administered 2016-01-31 (×3): 50 ug via INTRAVENOUS

## 2016-01-31 MED ORDER — LIDOCAINE 2% (20 MG/ML) 5 ML SYRINGE
INTRAMUSCULAR | Status: DC | PRN
  Administered 2016-01-31: 100 mg via INTRAVENOUS

## 2016-01-31 MED ORDER — ALVIMOPAN 12 MG PO CAPS
12.0000 mg | ORAL_CAPSULE | Freq: Once | ORAL | Status: AC
  Administered 2016-01-31: 12 mg via ORAL
  Filled 2016-01-31: qty 1

## 2016-01-31 MED ORDER — ONDANSETRON HCL 4 MG/2ML IJ SOLN
INTRAMUSCULAR | Status: AC
  Filled 2016-01-31: qty 2

## 2016-01-31 MED ORDER — BUPIVACAINE HCL (PF) 0.5 % IJ SOLN
INTRAMUSCULAR | Status: DC | PRN
  Administered 2016-01-31: 30 mL

## 2016-01-31 MED ORDER — BUPIVACAINE LIPOSOME 1.3 % IJ SUSP
20.0000 mL | INTRAMUSCULAR | Status: DC
  Filled 2016-01-31: qty 20

## 2016-01-31 MED ORDER — AMLODIPINE BESYLATE 10 MG PO TABS
10.0000 mg | ORAL_TABLET | Freq: Every day | ORAL | Status: DC
  Administered 2016-02-01 – 2016-02-04 (×3): 10 mg via ORAL
  Filled 2016-01-31 (×4): qty 1

## 2016-01-31 MED ORDER — HEPARIN SODIUM (PORCINE) 5000 UNIT/ML IJ SOLN
INTRAMUSCULAR | Status: DC | PRN
  Administered 2016-01-31: 5000 [IU] via SUBCUTANEOUS

## 2016-01-31 MED ORDER — DEXTROSE 5 % IV SOLN
2.0000 g | INTRAVENOUS | Status: AC
  Administered 2016-01-31: 2 g via INTRAVENOUS

## 2016-01-31 MED ORDER — LACTATED RINGERS IV SOLN
INTRAVENOUS | Status: DC | PRN
  Administered 2016-01-31 (×2): via INTRAVENOUS

## 2016-01-31 MED ORDER — DEXAMETHASONE SODIUM PHOSPHATE 10 MG/ML IJ SOLN
INTRAMUSCULAR | Status: AC
  Filled 2016-01-31: qty 1

## 2016-01-31 MED ORDER — SCOPOLAMINE 1 MG/3DAYS TD PT72
MEDICATED_PATCH | TRANSDERMAL | Status: DC | PRN
  Administered 2016-01-31: 1 via TRANSDERMAL

## 2016-01-31 MED ORDER — PROMETHAZINE HCL 25 MG/ML IJ SOLN: 6.2500 mg | INTRAMUSCULAR | Status: DC | PRN

## 2016-01-31 MED ORDER — KETOROLAC TROMETHAMINE 30 MG/ML IJ SOLN: 30.0000 mg | Freq: Once | INTRAMUSCULAR | Status: DC | PRN

## 2016-01-31 MED ORDER — DIPHENHYDRAMINE HCL 50 MG/ML IJ SOLN: 12.5000 mg | Freq: Four times a day (QID) | INTRAMUSCULAR | Status: DC | PRN

## 2016-01-31 MED ORDER — DIPHENHYDRAMINE HCL 12.5 MG/5ML PO ELIX
12.5000 mg | ORAL_SOLUTION | Freq: Four times a day (QID) | ORAL | Status: DC | PRN
Start: 2016-01-31 — End: 2016-02-04

## 2016-01-31 MED ORDER — ATORVASTATIN CALCIUM 20 MG PO TABS
20.0000 mg | ORAL_TABLET | Freq: Every day | ORAL | Status: DC
  Administered 2016-02-02 – 2016-02-03 (×2): 20 mg via ORAL
  Filled 2016-01-31 (×2): qty 1

## 2016-01-31 MED ORDER — BUPIVACAINE HCL (PF) 0.5 % IJ SOLN
INTRAMUSCULAR | Status: AC
  Filled 2016-01-31: qty 30

## 2016-01-31 MED ORDER — GLIPIZIDE ER 5 MG PO TB24
10.0000 mg | ORAL_TABLET | Freq: Every day | ORAL | Status: DC
  Administered 2016-02-02 – 2016-02-04 (×3): 10 mg via ORAL
  Filled 2016-01-31 (×3): qty 2

## 2016-01-31 MED ORDER — PHENYLEPHRINE 40 MCG/ML (10ML) SYRINGE FOR IV PUSH (FOR BLOOD PRESSURE SUPPORT)
PREFILLED_SYRINGE | INTRAVENOUS | Status: AC
  Filled 2016-01-31: qty 10

## 2016-01-31 MED ORDER — INSULIN ASPART 100 UNIT/ML ~~LOC~~ SOLN
0.0000 [IU] | Freq: Every day | SUBCUTANEOUS | Status: DC
Start: 2016-01-31 — End: 2016-02-04

## 2016-01-31 MED ORDER — MORPHINE SULFATE (PF) 2 MG/ML IV SOLN
2.0000 mg | INTRAVENOUS | Status: DC | PRN
  Administered 2016-02-01: 2 mg via INTRAVENOUS
  Filled 2016-01-31: qty 1

## 2016-01-31 MED ORDER — CEFOTETAN DISODIUM-DEXTROSE 2-2.08 GM-% IV SOLR
INTRAVENOUS | Status: AC
  Filled 2016-01-31: qty 50

## 2016-01-31 MED ORDER — MIDAZOLAM HCL 2 MG/2ML IJ SOLN
INTRAMUSCULAR | Status: AC
  Filled 2016-01-31: qty 2

## 2016-01-31 MED ORDER — MIDAZOLAM HCL 5 MG/5ML IJ SOLN
INTRAMUSCULAR | Status: DC | PRN
  Administered 2016-01-31: 2 mg via INTRAVENOUS

## 2016-01-31 MED ORDER — HYDROMORPHONE HCL 1 MG/ML IJ SOLN
INTRAMUSCULAR | Status: AC
  Filled 2016-01-31: qty 1

## 2016-01-31 MED ORDER — KCL IN DEXTROSE-NACL 20-5-0.45 MEQ/L-%-% IV SOLN
INTRAVENOUS | Status: DC
  Administered 2016-01-31: 16:00:00 via INTRAVENOUS
  Administered 2016-02-01: 75 mL/h via INTRAVENOUS
  Administered 2016-02-01 – 2016-02-04 (×5): via INTRAVENOUS
  Filled 2016-01-31 (×8): qty 1000

## 2016-01-31 MED ORDER — PIOGLITAZONE HCL 45 MG PO TABS
45.0000 mg | ORAL_TABLET | Freq: Every day | ORAL | Status: DC
  Administered 2016-02-02 – 2016-02-04 (×3): 45 mg via ORAL
  Filled 2016-01-31 (×3): qty 1

## 2016-01-31 MED ORDER — HYDROCHLOROTHIAZIDE 25 MG PO TABS
25.0000 mg | ORAL_TABLET | Freq: Every day | ORAL | Status: DC
  Administered 2016-01-31 – 2016-02-04 (×5): 25 mg via ORAL
  Filled 2016-01-31 (×5): qty 1

## 2016-01-31 MED ORDER — ROCURONIUM BROMIDE 10 MG/ML (PF) SYRINGE
PREFILLED_SYRINGE | INTRAVENOUS | Status: AC
  Filled 2016-01-31: qty 10

## 2016-01-31 MED ORDER — SCOPOLAMINE 1 MG/3DAYS TD PT72
MEDICATED_PATCH | TRANSDERMAL | Status: AC
  Filled 2016-01-31: qty 1

## 2016-01-31 MED ORDER — ENOXAPARIN SODIUM 40 MG/0.4ML ~~LOC~~ SOLN
40.0000 mg | SUBCUTANEOUS | Status: DC
  Administered 2016-02-01 – 2016-02-04 (×4): 40 mg via SUBCUTANEOUS
  Filled 2016-01-31 (×4): qty 0.4

## 2016-01-31 MED ORDER — SUGAMMADEX SODIUM 200 MG/2ML IV SOLN
INTRAVENOUS | Status: AC
  Filled 2016-01-31: qty 2

## 2016-01-31 MED ORDER — DEXTROSE 5 % IV SOLN
2.0000 g | Freq: Two times a day (BID) | INTRAVENOUS | Status: AC
  Administered 2016-01-31: 2 g via INTRAVENOUS
  Filled 2016-01-31: qty 2

## 2016-01-31 MED ORDER — ONDANSETRON HCL 4 MG/2ML IJ SOLN
INTRAMUSCULAR | Status: DC | PRN
  Administered 2016-01-31: 4 mg via INTRAVENOUS

## 2016-01-31 MED ORDER — GABAPENTIN 300 MG PO CAPS
300.0000 mg | ORAL_CAPSULE | ORAL | Status: AC
  Administered 2016-01-31: 300 mg via ORAL
  Filled 2016-01-31: qty 1

## 2016-01-31 SURGICAL SUPPLY — 94 items
BLADE EXTENDED COATED 6.5IN (ELECTRODE) IMPLANT
CANNULA REDUC XI 12-8 STAPL (CANNULA) ×1
CANNULA REDUCER 12-8 DVNC XI (CANNULA) ×1 IMPLANT
CELLS DAT CNTRL 66122 CELL SVR (MISCELLANEOUS) IMPLANT
CLIP LIGATING HEM O LOK PURPLE (MISCELLANEOUS) IMPLANT
CLIP LIGATING HEMOLOK MED (MISCELLANEOUS) IMPLANT
COUNTER NEEDLE 20 DBL MAG RED (NEEDLE) ×2 IMPLANT
COVER MAYO STAND STRL (DRAPES) ×4 IMPLANT
COVER TIP SHEARS 8 DVNC (MISCELLANEOUS) ×1 IMPLANT
COVER TIP SHEARS 8MM DA VINCI (MISCELLANEOUS) ×1
DECANTER SPIKE VIAL GLASS SM (MISCELLANEOUS) ×1 IMPLANT
DEVICE TROCAR PUNCTURE CLOSURE (ENDOMECHANICALS) IMPLANT
DRAIN CHANNEL 19F RND (DRAIN) IMPLANT
DRAPE ARM DVNC X/XI (DISPOSABLE) ×4 IMPLANT
DRAPE COLUMN DVNC XI (DISPOSABLE) ×1 IMPLANT
DRAPE DA VINCI XI ARM (DISPOSABLE) ×4
DRAPE DA VINCI XI COLUMN (DISPOSABLE) ×1
DRAPE SURG IRRIG POUCH 19X23 (DRAPES) ×2 IMPLANT
DRSG OPSITE POSTOP 4X10 (GAUZE/BANDAGES/DRESSINGS) IMPLANT
DRSG OPSITE POSTOP 4X6 (GAUZE/BANDAGES/DRESSINGS) ×1 IMPLANT
DRSG OPSITE POSTOP 4X8 (GAUZE/BANDAGES/DRESSINGS) IMPLANT
ELECT PENCIL ROCKER SW 15FT (MISCELLANEOUS) ×2 IMPLANT
ELECT REM PT RETURN 15FT ADLT (MISCELLANEOUS) ×2 IMPLANT
ENDOLOOP SUT PDS II  0 18 (SUTURE)
ENDOLOOP SUT PDS II 0 18 (SUTURE) IMPLANT
EVACUATOR SILICONE 100CC (DRAIN) IMPLANT
GAUZE SPONGE 4X4 12PLY STRL (GAUZE/BANDAGES/DRESSINGS) ×1 IMPLANT
GLOVE BIO SURGEON STRL SZ 6.5 (GLOVE) ×6 IMPLANT
GLOVE BIOGEL PI IND STRL 7.0 (GLOVE) ×3 IMPLANT
GLOVE BIOGEL PI INDICATOR 7.0 (GLOVE) ×3
GOWN STRL REUS W/TWL 2XL LVL3 (GOWN DISPOSABLE) ×6 IMPLANT
GOWN STRL REUS W/TWL XL LVL3 (GOWN DISPOSABLE) ×8 IMPLANT
GRASPER ENDOPATH ANVIL 10MM (MISCELLANEOUS) IMPLANT
HOLDER FOLEY CATH W/STRAP (MISCELLANEOUS) ×2 IMPLANT
IRRIG SUCT STRYKERFLOW 2 WTIP (MISCELLANEOUS) ×2
IRRIGATION SUCT STRKRFLW 2 WTP (MISCELLANEOUS) ×1 IMPLANT
KIT PROCEDURE DA VINCI SI (MISCELLANEOUS) ×1
KIT PROCEDURE DVNC SI (MISCELLANEOUS) ×1 IMPLANT
LEGGING LITHOTOMY PAIR STRL (DRAPES) ×2 IMPLANT
NDL INSUFFLATION 14GA 120MM (NEEDLE) ×1 IMPLANT
NEEDLE INSUFFLATION 14GA 120MM (NEEDLE) ×2 IMPLANT
PACK CARDIOVASCULAR III (CUSTOM PROCEDURE TRAY) ×2 IMPLANT
PACK COLON (CUSTOM PROCEDURE TRAY) ×2 IMPLANT
PORT LAP GEL ALEXIS MED 5-9CM (MISCELLANEOUS) IMPLANT
RETRACTOR WND ALEXIS 18 MED (MISCELLANEOUS) IMPLANT
RTRCTR WOUND ALEXIS 18CM MED (MISCELLANEOUS)
SCISSORS LAP 5X35 DISP (ENDOMECHANICALS) ×2 IMPLANT
SEAL CANN UNIV 5-8 DVNC XI (MISCELLANEOUS) ×3 IMPLANT
SEAL XI 5MM-8MM UNIVERSAL (MISCELLANEOUS) ×3
SEALER VESSEL DA VINCI XI (MISCELLANEOUS) ×1
SEALER VESSEL EXT DVNC XI (MISCELLANEOUS) ×1 IMPLANT
SET BI-LUMEN FLTR TB AIRSEAL (TUBING) ×2 IMPLANT
SLEEVE ADV FIXATION 5X100MM (TROCAR) IMPLANT
SOLUTION ELECTROLUBE (MISCELLANEOUS) ×2 IMPLANT
STAPLER 45 BLU RELOAD XI (STAPLE) IMPLANT
STAPLER 45 BLUE RELOAD XI (STAPLE)
STAPLER 45 GREEN RELOAD XI (STAPLE) ×2
STAPLER 45 GRN RELOAD XI (STAPLE) ×2 IMPLANT
STAPLER CANNULA SEAL DVNC XI (STAPLE) ×1 IMPLANT
STAPLER CANNULA SEAL XI (STAPLE) ×1
STAPLER CIRC ILS CVD 33MM 37CM (STAPLE) ×1 IMPLANT
STAPLER SHEATH (SHEATH) ×1
STAPLER SHEATH ENDOWRIST DVNC (SHEATH) ×1 IMPLANT
STAPLER VISISTAT 35W (STAPLE) ×2 IMPLANT
SUT ETHILON 2 0 PS N (SUTURE) IMPLANT
SUT NOVA NAB GS-21 0 18 T12 DT (SUTURE) ×4 IMPLANT
SUT PDS AB 1 CTX 36 (SUTURE) IMPLANT
SUT PDS AB 1 TP1 96 (SUTURE) IMPLANT
SUT PROLENE 2 0 KS (SUTURE) ×2 IMPLANT
SUT SILK 2 0 (SUTURE) ×2
SUT SILK 2 0 SH CR/8 (SUTURE) ×2 IMPLANT
SUT SILK 2-0 18XBRD TIE 12 (SUTURE) ×1 IMPLANT
SUT SILK 3 0 (SUTURE) ×2
SUT SILK 3 0 SH CR/8 (SUTURE) ×2 IMPLANT
SUT SILK 3-0 18XBRD TIE 12 (SUTURE) ×1 IMPLANT
SUT V-LOC BARB 180 2/0GR6 GS22 (SUTURE)
SUT VIC AB 2-0 SH 18 (SUTURE) ×2 IMPLANT
SUT VIC AB 2-0 SH 27 (SUTURE) ×2
SUT VIC AB 2-0 SH 27X BRD (SUTURE) ×1 IMPLANT
SUT VIC AB 3-0 SH 18 (SUTURE) IMPLANT
SUT VIC AB 3-0 SH 27 (SUTURE) ×4
SUT VIC AB 3-0 SH 27XBRD (SUTURE) IMPLANT
SUT VIC AB 4-0 PS2 27 (SUTURE) ×4 IMPLANT
SUTURE V-LC BRB 180 2/0GR6GS22 (SUTURE) IMPLANT
SYRINGE 10CC LL (SYRINGE) ×2 IMPLANT
SYS LAPSCP GELPORT 120MM (MISCELLANEOUS)
SYSTEM LAPSCP GELPORT 120MM (MISCELLANEOUS) IMPLANT
TAPE CLOTH SURG 4X10 WHT LF (GAUZE/BANDAGES/DRESSINGS) ×1 IMPLANT
TOWEL OR 17X26 10 PK STRL BLUE (TOWEL DISPOSABLE) ×2 IMPLANT
TOWEL OR NON WOVEN STRL DISP B (DISPOSABLE) ×2 IMPLANT
TRAY FOLEY CATH 14FRSI W/METER (CATHETERS) ×2 IMPLANT
TRAY FOLEY W/METER SILVER 16FR (SET/KITS/TRAYS/PACK) ×1 IMPLANT
TROCAR ADV FIXATION 5X100MM (TROCAR) ×2 IMPLANT
TUBING CONNECTING 10 (TUBING) IMPLANT

## 2016-01-31 NOTE — H&P (Signed)
The patient is a 60 year old female who presents with colorectal cancer. Patient hospitalized for rectal bleeding. Colonoscopy revealed a large rectosigmoid mass. Biopsies confirmed adenocarcinoma. CEA was normal at 4.1. Rectal ultrasound showed a T3 N0 mass. The area was tattooed. CT scans showed no signs of metastatic disease and a proximal to mid rectal mass. She has completed neoadjunvant ChemoRT. She is having 1-2 soft bowel movements a day and denies any bleeding. She denies abdominal pain.   Problem List/Past Medical  RECTAL CANCER (C20)   Other Problems  Hypercholesterolemia Colon Cancer High blood pressure  Past Surgical History  No pertinent past surgical history  Diagnostic Studies History  Pap Smear >5 years ago Colonoscopy within last year Mammogram within last year  Allergies  No Known Drug Allergies 10/06/2015  Medication History  Hydrocodone-Acetaminophen (5-325MG  Tablet, Oral) Active. ClonazePAM (0.5MG  Tablet, Oral) Active. AmLODIPine Besylate (10MG  Tablet, Oral) Active. Atenolol (100MG  Tablet, Oral) Active. Atorvastatin Calcium (20MG  Tablet, Oral) Active. GlipiZIDE XL (5MG  Tablet ER 24HR, Oral) Active. HydroCHLOROthiazide (25MG  Tablet, Oral) Active. Irbesartan (300MG  Tablet, Oral) Active. Janumet (50-1000MG  Tablet, Oral) Active. Atenolol (25MG  Tablet, Oral) Active. Klor-Con M10 (10MEQ Tablet ER, Oral) Active. Pioglitazone HCl (45MG  Tablet, Oral) Active. Aspirin (81MG  Tablet Chewable, Oral) Active. Medications Reconciled Neomycin Sulfate (500MG  Tablet, 2 (two) Tablet Oral SEE NOTE, Taken starting 12/04/2015) Active. (TAKE TWO TABLETS AT 2 PM, 3 PM, AND 10 PM THE DAY PRIOR TO SURGERY) Flagyl (500MG  Tablet, 2 (two) Tablet Oral SEE NOTE, Taken starting 12/04/2015) Active. (Take at 2pm, 3pm, and 10pm the day prior to your colon operation)  Social History  Tobacco use Never smoker. No drug use Alcohol use  Occasional alcohol use. No caffeine use  Family History Diabetes Mellitus Brother, Father, Mother, Sister. Heart Disease Father, Sister. Hypertension Brother, Daughter, Father, Mother, Sister.  Pregnancy / Birth History Age at menarche 55 years. Para 2 Age of menopause [age] Maternal age 40-25     Review of Systems General Present- Weight Loss. Not Present- Appetite Loss, Chills, Fatigue, Fever, Night Sweats and Weight Gain. Skin Not Present- Change in Wart/Mole, Dryness, Hives, Jaundice, New Lesions, Non-Healing Wounds, Rash and Ulcer. HEENT Not Present- Earache, Hearing Loss, Hoarseness, Nose Bleed, Oral Ulcers, Ringing in the Ears, Seasonal Allergies, Sinus Pain, Sore Throat, Visual Disturbances, Wears glasses/contact lenses and Yellow Eyes. Respiratory Not Present- Bloody sputum, Chronic Cough, Difficulty Breathing, Snoring and Wheezing. Breast Not Present- Breast Mass, Breast Pain, Nipple Discharge and Skin Changes. Cardiovascular Not Present- Chest Pain, Difficulty Breathing Lying Down, Leg Cramps, Palpitations, Rapid Heart Rate, Shortness of Breath and Swelling of Extremities. Gastrointestinal Present- Bloody Stool. Not Present- Abdominal Pain, Bloating, Change in Bowel Habits, Chronic diarrhea, Constipation, Difficulty Swallowing, Excessive gas, Gets full quickly at meals, Hemorrhoids, Indigestion, Nausea, Rectal Pain and Vomiting. Female Genitourinary Not Present- Frequency, Nocturia, Painful Urination, Pelvic Pain and Urgency. Musculoskeletal Not Present- Back Pain, Joint Pain, Joint Stiffness, Muscle Pain, Muscle Weakness and Swelling of Extremities. Neurological Not Present- Decreased Memory, Fainting, Headaches, Numbness, Seizures, Tingling, Tremor, Trouble walking and Weakness. Psychiatric Not Present- Anxiety, Bipolar, Change in Sleep Pattern, Depression, Fearful and Frequent crying. Endocrine Not Present- Cold Intolerance, Excessive Hunger, Hair Changes,  Heat Intolerance, Hot flashes and New Diabetes. Hematology Not Present- Easy Bruising, Excessive bleeding, Gland problems, HIV and Persistent Infections.  BP (!) 177/80 Comment: RN notified  Pulse 98   Temp 98.5 F (36.9 C) (Oral)   Resp 16   Ht 5\' 5"  (1.651 m)   Wt 74.4 kg (164 lb)  SpO2 100%   BMI 27.29 kg/m    Physical Exam   General Mental Status-Alert. General Appearance-Not in acute distress. Build & Nutrition-Well nourished. Posture-Normal posture. Gait-Normal.  Head and Neck Head-normocephalic, atraumatic with no lesions or palpable masses. Trachea-midline.  Chest and Lung Exam Chest and lung exam reveals -on auscultation, normal breath sounds, no adventitious sounds and normal vocal resonance.  Cardiovascular Cardiovascular examination reveals -normal heart sounds, regular rate and rhythm with no murmurs.  Abdomen Inspection Inspection of the abdomen reveals - No Hernias. Palpation/Percussion Palpation and Percussion of the abdomen reveal - Soft, Non Tender, No Rigidity (guarding), No hepatosplenomegaly and No Palpable abdominal masses.  Neurologic Neurologic evaluation reveals -alert and oriented x 3 with no impairment of recent or remote memory, normal attention span and ability to concentrate, normal sensation and normal coordination.  Musculoskeletal Normal Exam - Bilateral-Upper Extremity Strength Normal and Lower Extremity Strength Normal.    Assessment & Plan   RECTAL CANCER (C20) Impression: 60 year old female with a uT3 N0 proximal rectal cancer, who has now completed her neoadjuvant chemoradiation. She is ready to schedule surgery. We will get this set up for approximately 6-8 weeks from now. The surgery and anatomy were described to the patient as well as the risks of surgery and the possible complications. These include: Bleeding, deep abdominal infections and possible wound complications such as hernia and  infection, damage to adjacent structures, leak of surgical connections, which can lead to other surgeries and possibly an ostomy, possible need for other procedures, such as abscess drains in radiology, possible prolonged hospital stay, possible diarrhea from removal of part of the colon, possible constipation from narcotics, possible bowel, bladder or sexual dysfunction if having rectal surgery, prolonged fatigue/weakness or appetite loss, possible early recurrence of of disease, possible complications of their medical problems such as heart disease or arrhythmias or lung problems, death (less than 1%). I believe the patient understands and wishes to proceed with the surgery.

## 2016-01-31 NOTE — Anesthesia Procedure Notes (Signed)
Procedure Name: Intubation Date/Time: 01/31/2016 8:40 AM Performed by: Noralyn Pick D Pre-anesthesia Checklist: Patient identified, Emergency Drugs available, Suction available and Patient being monitored Patient Re-evaluated:Patient Re-evaluated prior to inductionOxygen Delivery Method: Circle system utilized Preoxygenation: Pre-oxygenation with 100% oxygen Intubation Type: IV induction Ventilation: Mask ventilation without difficulty Laryngoscope Size: Mac and 3 Grade View: Grade I Tube type: Oral Tube size: 7.5 mm Number of attempts: 1 Airway Equipment and Method: Stylet Placement Confirmation: ETT inserted through vocal cords under direct vision,  positive ETCO2 and breath sounds checked- equal and bilateral Secured at: 21 cm Tube secured with: Tape Dental Injury: Teeth and Oropharynx as per pre-operative assessment

## 2016-01-31 NOTE — Anesthesia Preprocedure Evaluation (Signed)
Anesthesia Evaluation  Patient identified by MRN, date of birth, ID band Patient awake    Reviewed: Allergy & Precautions, NPO status , Patient's Chart, lab work & pertinent test results  Airway Mallampati: II  TM Distance: >3 FB Neck ROM: Full    Dental no notable dental hx.    Pulmonary neg pulmonary ROS,    Pulmonary exam normal breath sounds clear to auscultation       Cardiovascular hypertension, Normal cardiovascular exam Rhythm:Regular Rate:Normal     Neuro/Psych negative neurological ROS  negative psych ROS   GI/Hepatic negative GI ROS, Neg liver ROS,   Endo/Other  diabetes  Renal/GU negative Renal ROS  negative genitourinary   Musculoskeletal negative musculoskeletal ROS (+)   Abdominal   Peds negative pediatric ROS (+)  Hematology negative hematology ROS (+)   Anesthesia Other Findings   Reproductive/Obstetrics negative OB ROS                             Anesthesia Physical Anesthesia Plan  ASA: II  Anesthesia Plan: General   Post-op Pain Management:    Induction: Intravenous  Airway Management Planned: Oral ETT  Additional Equipment:   Intra-op Plan:   Post-operative Plan: Extubation in OR  Informed Consent: I have reviewed the patients History and Physical, chart, labs and discussed the procedure including the risks, benefits and alternatives for the proposed anesthesia with the patient or authorized representative who has indicated his/her understanding and acceptance.   Dental advisory given  Plan Discussed with: CRNA and [place]  Anesthesia Plan Comments:         Anesthesia Quick Evaluation  

## 2016-01-31 NOTE — Anesthesia Postprocedure Evaluation (Signed)
Anesthesia Post Note  Patient: Karen Good  Procedure(s) Performed: Procedure(s) (LRB): XI ROBOT ASSISTED low anterior resection (N/A)  Patient location during evaluation: PACU Anesthesia Type: General Level of consciousness: awake and alert Pain management: pain level controlled Vital Signs Assessment: post-procedure vital signs reviewed and stable Respiratory status: spontaneous breathing, nonlabored ventilation, respiratory function stable and patient connected to nasal cannula oxygen Cardiovascular status: blood pressure returned to baseline and stable Postop Assessment: no signs of nausea or vomiting Anesthetic complications: no    Last Vitals:  Vitals:   01/31/16 1217 01/31/16 1227  BP:    Pulse: 91 91  Resp: 16 16  Temp:      Last Pain:  Vitals:   01/31/16 1227  TempSrc:   PainSc: 5                  Sharnette Kitamura S

## 2016-01-31 NOTE — Transfer of Care (Signed)
Immediate Anesthesia Transfer of Care Note  Patient: Karen Good  Procedure(s) Performed: Procedure(s): XI ROBOT ASSISTED low anterior resection (N/A)  Patient Location: PACU  Anesthesia Type:General  Level of Consciousness: awake, alert  and oriented  Airway & Oxygen Therapy: Patient Spontanous Breathing and Patient connected to face mask oxygen  Post-op Assessment: Report given to RN and Post -op Vital signs reviewed and stable  Post vital signs: Reviewed and stable  Last Vitals:  Vitals:   01/31/16 0711  BP: (!) 177/80  Pulse: 98  Resp: 16  Temp: 36.9 C    Last Pain:  Vitals:   01/31/16 0711  TempSrc: Oral         Complications: No apparent anesthesia complications

## 2016-01-31 NOTE — Op Note (Signed)
01/31/2016  11:49 AM  PATIENT:  Karen Good  60 y.o. female  Patient Care Team: Biagio Borg, MD as PCP - General Kyung Rudd, MD as Consulting Physician (Radiation Oncology) Leighton Ruff, MD as Consulting Physician (General Surgery) Milus Banister, MD as Attending Physician (Gastroenterology) Tania Ade, RN as Registered Nurse  PRE-OPERATIVE DIAGNOSIS:  Rectal cancer  POST-OPERATIVE DIAGNOSIS:  rectal cancer  PROCEDURE:   XI ROBOT ASSISTED LOW ANTERIOR RESECTION  Bartelt(s): Leighton Ruff, MD Michael Boston, MD  ASSISTANT: Dr Johney Maine   ANESTHESIA:   local and general  EBL: 81ml Total I/O In: 1000 [I.V.:1000] Out: 375 [Urine:300; Blood:75]  Delay start of Pharmacological VTE agent (>24hrs) due to surgical blood loss or risk of bleeding:  no  DRAINS: (64F) Jackson-Pratt drain(s) with closed bulb suction in the pelvis   SPECIMEN:  Source of Specimen:  Rectosigmoid   DISPOSITION OF SPECIMEN:  PATHOLOGY  COUNTS:  YES  PLAN OF CARE: Admit to inpatient   PATIENT DISPOSITION:  PACU - hemodynamically stable.  INDICATION:      I recommended segmental resection:  The anatomy & physiology of the digestive tract was discussed.  The pathophysiology was discussed.  Natural history risks without surgery was discussed.   I worked to give an overview of the disease and the frequent need to have multispecialty involvement.  I feel the risks of no intervention will lead to serious problems that outweigh the operative risks; therefore, I recommended a partial colectomy to remove the pathology.  Laparoscopic & open techniques were discussed.   Risks such as bleeding, infection, abscess, leak, reoperation, possible ostomy, hernia, heart attack, death, and other risks were discussed.  I noted a good likelihood this will help address the problem.   Goals of post-operative recovery were discussed as well.    The patient expressed understanding & wished to proceed with surgery.  OR  FINDINGS:   Patient had a mass that started at the anterior peritoneal reflection to mid rectum   No obvious metastatic disease on visceral parietal peritoneum or liver.  The anastomosis rests 6 cm from the anal verge by rigid proctoscopy.  DESCRIPTION:   Informed consent was confirmed.  The patient underwent general anaesthesia without difficulty.  The patient was positioned appropriately.  VTE prevention in place.  The patient's abdomen was clipped, prepped, & draped in a sterile fashion.  Surgical timeout confirmed our plan.  The patient was positioned in reverse Trendelenburg.  Abdominal entry was gained using a Varies needle in the LUQ.  Entry was clean.  I induced carbon dioxide insufflation.  I placed an 8 mm robotic port in the right upper quadrant.  Camera inspection revealed no injury.  Extra ports were carefully placed under direct laparoscopic visualization. The patient was placed in Trendelenburg position.    I reflected the greater omentum and the upper abdomen the small bowel in the upper abdomen.  the robot was undocked to the patient's left side. Instrument is were placed under direct visualization.  I scored the base of peritoneum of the right side of the mesentery of the left colon from the ligament of Treitz to the peritoneal reflection of the mid rectum.   I elevated the sigmoid mesentery and enetered into the retro-mesenteric plane. We were able to identify the left ureter and gonadal vessels. We kept those posterior within the retroperitoneum and elevated the left colon mesentery off that. I did isolated IMA pedicle but did not ligate it yet.  I continued  distally and got into the avascular plane posterior to the mesorectum. I mobilized the peritoneal coverings towards the peritoneal reflection on both the right and left sides of the rectum.  I could see the right and left ureters and stayed away from them. I divided the anterior peritoneal reflection. The proximal tattoo could  be visualized. I continued to divide into the rectovaginal septum using mostly blunt dissection. The lateral edges were dissected free at the mesorectum plane. The distal tattoo was identified. I skeletonized the mesorectum at this portion. This was done using a vessel sealer device. The mid rectum was then transected using a green load robotic stapler 2.   I skeletonized the inferior mesenteric artery pedicle.  I went down to its takeoff from the aorta.   After confirming the left ureter was out of the way, I went ahead and ligated the inferior mesenteric artery pedicle with bipolarrobotic vessel sealer ~2cm above its takeoff from the aorta.  We ensured hemostasis.  I mobilized the left colon in a lateral to medial fashion off the line of Toldt up towards the splenic flexure to ensure good mobilization of the left colon to reach into the pelvis.  This allowed the colon to easily reach to the remaining rectal stump. Lastly I divided the colonic mesentery just proximal to the vessel division using a robotic vessel sealer.  After this was completed I enlarged my 12 mm port and placed an Zebulon wound protector. The distal portion of my specimen was brought out through the wound protector. I placed a pursestring device over the previously skeletonized portion of colon. The colon was transected at this point. A 2-0 Prolene suture was placed through the pursestring device. This was secured with 3-0 silk sutures.  A 33 mm EEA anvil was then placed into the transected colon. The pursestring was tied tightly around this. The fat was cleared away from the anvil deck.  This was placed back into the abdomen and a colorectal anastomosis was created with the EEA stapler under laparoscopic visualization. There was no tension on the anastomosis. There was no leak when tested with insufflation under water. Hemostasis was good. A 19 [place] Blake drain was placed into the pelvis and brought out through the right lower quadrant  port site. This was secured with a 2-0 Prolene suture.  With incision clean gowns, gloves, instruments and drapes. The extraction site was closed using a 2-0 Vicryl in the peritoneal layer in a running fashion. The fascia was then closed using 0 Vicryl interrupted sutures. The subcutaneous tissue was reapproximated using interrupted 2-0 Vicryl sutures. The skin was closed with a running 4-0 Vicryl subcuticular suture. The port sites were also closed with the 4-0 Vicryl suture and local band. A dressing was placed over the extraction site. The patient was awakened from anesthesia and sent to the post anesthesia care unit in stable condition. All counts were correct per operating room staff.

## 2016-02-01 LAB — BASIC METABOLIC PANEL
ANION GAP: 7 (ref 5–15)
BUN: 12 mg/dL (ref 6–20)
CHLORIDE: 103 mmol/L (ref 101–111)
CO2: 27 mmol/L (ref 22–32)
Calcium: 9 mg/dL (ref 8.9–10.3)
Creatinine, Ser: 0.63 mg/dL (ref 0.44–1.00)
Glucose, Bld: 150 mg/dL — ABNORMAL HIGH (ref 65–99)
POTASSIUM: 3 mmol/L — AB (ref 3.5–5.1)
SODIUM: 137 mmol/L (ref 135–145)

## 2016-02-01 LAB — CBC
HEMATOCRIT: 30.9 % — AB (ref 36.0–46.0)
HEMOGLOBIN: 10.2 g/dL — AB (ref 12.0–15.0)
MCH: 28.7 pg (ref 26.0–34.0)
MCHC: 33 g/dL (ref 30.0–36.0)
MCV: 87 fL (ref 78.0–100.0)
Platelets: 255 10*3/uL (ref 150–400)
RBC: 3.55 MIL/uL — AB (ref 3.87–5.11)
RDW: 16.1 % — AB (ref 11.5–15.5)
WBC: 6.4 10*3/uL (ref 4.0–10.5)

## 2016-02-01 LAB — GLUCOSE, CAPILLARY
GLUCOSE-CAPILLARY: 192 mg/dL — AB (ref 65–99)
Glucose-Capillary: 143 mg/dL — ABNORMAL HIGH (ref 65–99)
Glucose-Capillary: 160 mg/dL — ABNORMAL HIGH (ref 65–99)

## 2016-02-01 MED ORDER — POTASSIUM CHLORIDE CRYS ER 20 MEQ PO TBCR
20.0000 meq | EXTENDED_RELEASE_TABLET | Freq: Once | ORAL | Status: AC
  Administered 2016-02-01: 20 meq via ORAL
  Filled 2016-02-01: qty 1

## 2016-02-01 NOTE — Progress Notes (Signed)
1 Day Post-Op Robotic LAR Subjective: Doing well.  No nausea.  No flatus.  Pain controlled.  Objective: Vital signs in last 24 hours: Temp:  [97.8 F (36.6 C)-98.9 F (37.2 C)] 98.6 F (37 C) (10/19 [id number]) Pulse Rate:  [88-98] 88 (10/19 0611) Resp:  [12-18] 16 (10/19 0611) BP: (131-166)/(59-83) 134/61 (10/19 0611) SpO2:  [100 %] 100 % (10/19 [id number])   Intake/Output from previous day: 10/18 0701 - 10/19 0700 In: 2423.8 [P.O.:120; I.V.:2303.8] Out: 1110 [Urine:775; Drains:260; Blood:75] Intake/Output this shift: Total I/O In: -  Out: 1000 [Urine:1000]   General appearance: cooperative GI: soft, moderately distended  Incision: no significant drainage  Lab Results:   Recent Labs  01/29/16 1145 02/01/16 0517  WBC 4.9 6.4  HGB 11.3* 10.2*  HCT 34.3* 30.9*  PLT 279 255   BMET  Recent Labs  01/29/16 1145 02/01/16 0517  NA 143 137  K 3.8 3.0*  CL 104 103  CO2 29 27  GLUCOSE 143* 150*  BUN 11 12  CREATININE 0.55 0.63  CALCIUM 9.9 9.0   PT/INR No results for input(s): LABPROT, INR in the last 72 hours. ABG No results for input(s): PHART, HCO3 in the last 72 hours.  Invalid input(s): PCO2, PO2  MEDS, Scheduled . acetaminophen  1,000 mg Oral Q6H  . alvimopan  12 mg Oral BID  . amLODipine  10 mg Oral Daily  . atenolol  12.5 mg Oral BID  . [START ON 02/02/2016] atorvastatin  20 mg Oral q1800  . enoxaparin (LOVENOX) injection  40 mg Subcutaneous Q24H  . [START ON 02/02/2016] glipiZIDE  10 mg Oral Daily  . hydrochlorothiazide  25 mg Oral Daily  . insulin aspart  0-20 Units Subcutaneous TID WC  . insulin aspart  0-5 Units Subcutaneous QHS  . irbesartan  300 mg Oral QHS  . [START ON 02/02/2016] linagliptin  5 mg Oral Daily  . [START ON 02/02/2016] metFORMIN  1,000 mg Oral BID WC  . [START ON 02/02/2016] pioglitazone  45 mg Oral Daily  . potassium chloride SA  20 mEq Oral Daily    Studies/Results: No results found.  Assessment: s/p Procedure(s): XI  ROBOT ASSISTED low anterior resection Patient Active Problem List   Diagnosis Date Noted  . Iron deficiency anemia 10/10/2015  . Rectal cancer ([place]) 09/24/2015  . Absolute anemia   . Hematochezia 09/23/2015  . Lower GI bleed 09/22/2015  . Encounter for preventative adult health care exam with abnormal findings 11/04/2010  . BACK PAIN 04/13/2010  . BACK STRAIN, LUMBAR 04/13/2010  . Diabetes ([place]) 06/02/2008  . Hyperlipidemia 06/02/2008  . Essential hypertension 06/02/2008    Expected post op course  Plan: Advance diet to clears.  Fulls later today if tolerated Ambulate    LOS: 1 day     .Rosario Adie, [facility], [place]   02/01/2016 9:44 AM

## 2016-02-02 LAB — BASIC METABOLIC PANEL
ANION GAP: 5 (ref 5–15)
BUN: 8 mg/dL (ref 6–20)
CALCIUM: 9 mg/dL (ref 8.9–10.3)
CO2: 30 mmol/L (ref 22–32)
Chloride: 105 mmol/L (ref 101–111)
Creatinine, Ser: 0.58 mg/dL (ref 0.44–1.00)
GFR calc Af Amer: >60 mL/min (ref >60)
GLUCOSE: 157 mg/dL — AB (ref 65–99)
Potassium: 3.6 mmol/L (ref 3.5–5.1)
SODIUM: 140 mmol/L (ref 135–145)

## 2016-02-02 LAB — GLUCOSE, CAPILLARY
GLUCOSE-CAPILLARY: 155 mg/dL — AB (ref 65–99)
GLUCOSE-CAPILLARY: 156 mg/dL — AB (ref 65–99)
GLUCOSE-CAPILLARY: 159 mg/dL — AB (ref 65–99)
GLUCOSE-CAPILLARY: 161 mg/dL — AB (ref 65–99)
Glucose-Capillary: 166 mg/dL — ABNORMAL HIGH (ref 65–99)

## 2016-02-02 LAB — CBC
HCT: 31.4 % — ABNORMAL LOW (ref 36.0–46.0)
HEMOGLOBIN: 10.3 g/dL — AB (ref 12.0–15.0)
MCH: 29.1 pg (ref 26.0–34.0)
MCHC: 32.8 g/dL (ref 30.0–36.0)
MCV: 88.7 fL (ref 78.0–100.0)
PLATELETS: 242 10*3/uL (ref 150–400)
RBC: 3.54 MIL/uL — AB (ref 3.87–5.11)
RDW: 16.1 % — ABNORMAL HIGH (ref 11.5–15.5)
WBC: 4 10*3/uL (ref 4.0–10.5)

## 2016-02-02 MED ORDER — HYDROCODONE-ACETAMINOPHEN 5-325 MG PO TABS: 1.0000 | ORAL_TABLET | Freq: Four times a day (QID) | ORAL | Status: DC | PRN

## 2016-02-02 MED ORDER — HYDROCODONE-ACETAMINOPHEN 5-325 MG PO TABS
1.0000 | ORAL_TABLET | Freq: Four times a day (QID) | ORAL | Status: DC | PRN
  Administered 2016-02-02: 1 via ORAL
  Filled 2016-02-02: qty 1

## 2016-02-02 NOTE — Discharge Instructions (Signed)

## 2016-02-02 NOTE — Progress Notes (Signed)
Atenolol and Norvasc held. Bp 117/50, heart rate 71.  Dr. Leighton Ruff notified.

## 2016-02-02 NOTE — Progress Notes (Signed)
2 Days Post-Op Robotic LAR Subjective: Doing well.  No nausea.  No flatus.  Pain controlled.  Objective: Vital signs in last 24 hours: Temp:  [97.9 F (36.6 C)-98 F (36.7 C)] 98 F (36.7 C) (10/20 0531) Pulse Rate:  [72-84] 72 (10/20 0531) Resp:  [16] 16 (10/20 0531) BP: (120-137)/(51-64) 131/55 (10/20 0531) SpO2:  [97 %-100 %] 97 % (10/20 0531)   Intake/Output from previous day: 10/19 0701 - 10/20 0700 In: 3180 [P.O.:1680; I.V.:1500] Out: 4585 [Urine:4400; Drains:185] Intake/Output this shift: No intake/output data recorded.   General appearance: cooperative GI: soft, moderately distended  Incision: no significant drainage  Lab Results:   Recent Labs  02/01/16 0517 02/02/16 0519  WBC 6.4 4.0  HGB 10.2* 10.3*  HCT 30.9* 31.4*  PLT 255 242   BMET  Recent Labs  02/01/16 0517 02/02/16 0519  NA 137 140  K 3.0* 3.6  CL 103 105  CO2 27 30  GLUCOSE 150* 157*  BUN 12 8  CREATININE 0.63 0.58  CALCIUM 9.0 9.0   PT/INR No results for input(s): LABPROT, INR in the last 72 hours. ABG No results for input(s): PHART, HCO3 in the last 72 hours.  Invalid input(s): PCO2, PO2  MEDS, Scheduled . alvimopan  12 mg Oral BID  . amLODipine  10 mg Oral Daily  . atenolol  12.5 mg Oral BID  . atorvastatin  20 mg Oral q1800  . enoxaparin (LOVENOX) injection  40 mg Subcutaneous Q24H  . glipiZIDE  10 mg Oral Daily  . hydrochlorothiazide  25 mg Oral Daily  . insulin aspart  0-20 Units Subcutaneous TID WC  . insulin aspart  0-5 Units Subcutaneous QHS  . irbesartan  300 mg Oral QHS  . linagliptin  5 mg Oral Daily  . metFORMIN  1,000 mg Oral BID WC  . pioglitazone  45 mg Oral Daily  . potassium chloride SA  20 mEq Oral Daily    Studies/Results: No results found.  Assessment: s/p Procedure(s): XI ROBOT ASSISTED low anterior resection Patient Active Problem List   Diagnosis Date Noted  . Iron deficiency anemia 10/10/2015  . Rectal cancer ([place]) 09/24/2015  .  Absolute anemia   . Hematochezia 09/23/2015  . Lower GI bleed 09/22/2015  . Encounter for preventative adult health care exam with abnormal findings 11/04/2010  . BACK PAIN 04/13/2010  . BACK STRAIN, LUMBAR 04/13/2010  . Diabetes ([place]) 06/02/2008  . Hyperlipidemia 06/02/2008  . Essential hypertension 06/02/2008    Expected post op course  Plan: Advance diet as tolerated Ambulate Cont JP until bowel function    LOS: 2 days     .Rosario Adie, MD [facility], [place]   02/02/2016 8:20 AM

## 2016-02-03 LAB — CBC
HEMATOCRIT: 31.2 % — AB (ref 36.0–46.0)
HEMOGLOBIN: 10.1 g/dL — AB (ref 12.0–15.0)
MCH: 28.9 pg (ref 26.0–34.0)
MCHC: 32.4 g/dL (ref 30.0–36.0)
MCV: 89.4 fL (ref 78.0–100.0)
Platelets: 241 10*3/uL (ref 150–400)
RBC: 3.49 MIL/uL — ABNORMAL LOW (ref 3.87–5.11)
RDW: 15.8 % — AB (ref 11.5–15.5)
WBC: 3.4 10*3/uL — ABNORMAL LOW (ref 4.0–10.5)

## 2016-02-03 LAB — BASIC METABOLIC PANEL
ANION GAP: 6 (ref 5–15)
BUN: 7 mg/dL (ref 6–20)
CALCIUM: 8.8 mg/dL — AB (ref 8.9–10.3)
CO2: 29 mmol/L (ref 22–32)
Chloride: 105 mmol/L (ref 101–111)
Creatinine, Ser: 0.5 mg/dL (ref 0.44–1.00)
GFR calc Af Amer: >60 mL/min (ref >60)
GLUCOSE: 139 mg/dL — AB (ref 65–99)
POTASSIUM: 3.3 mmol/L — AB (ref 3.5–5.1)
SODIUM: 140 mmol/L (ref 135–145)

## 2016-02-03 MED ORDER — POTASSIUM CHLORIDE CRYS ER 20 MEQ PO TBCR
20.0000 meq | EXTENDED_RELEASE_TABLET | Freq: Two times a day (BID) | ORAL | Status: AC
  Administered 2016-02-03 (×2): 20 meq via ORAL
  Filled 2016-02-03 (×2): qty 1

## 2016-02-03 NOTE — Progress Notes (Signed)
3 Days Post-Op  Subjective: No complaints this morning. Has been tolerating a liquid diet without nausea. No pain, just sore. He has been ambulating. Passing gas but no flatus.  Objective: Vital signs in last 24 hours: Temp:  [98.1 F (36.7 C)-98.7 F (37.1 C)] 98.2 F (36.8 C) (10/21 0518) Pulse Rate:  [71-88] 85 (10/21 0518) Resp:  [15-16] 16 (10/21 0518) BP: (117-133)/(49-80) 133/80 (10/21 0518) SpO2:  [93 %-100 %] 100 % (10/21 0518) Last BM Date: 01/30/16  Intake/Output from previous day: 10/20 0701 - 10/21 0700 In: 1440 [P.O.:540; I.V.:900] Out: 495 [Urine:400; Drains:95] Intake/Output this shift: No intake/output data recorded.  General appearance: alert, cooperative and no distress GI: normal findings: soft, non-tender and Nondistended Incision/Wound: No erythema. A little serosanguineous drainage around the JP.  Lab Results:   Recent Labs  02/02/16 0519 02/03/16 0439  WBC 4.0 3.4*  HGB 10.3* 10.1*  HCT 31.4* 31.2*  PLT 242 241   BMET  Recent Labs  02/02/16 0519 02/03/16 0439  NA 140 140  K 3.6 3.3*  CL 105 105  CO2 30 29  GLUCOSE 157* 139*  BUN 8 7  CREATININE 0.58 0.50  CALCIUM 9.0 8.8*     Studies/Results: No results found.  Anti-infectives: Anti-infectives    Start     Dose/Rate Route Frequency Ordered Stop   01/31/16 2100  cefoTEtan (CEFOTAN) 2 g in dextrose 5 % 50 mL IVPB     2 g 100 mL/hr over 30 Minutes Intravenous Every 12 hours 01/31/16 1449 01/31/16 2130   01/31/16 0622  cefoTEtan (CEFOTAN) 2 g in dextrose 5 % 50 mL IVPB     2 g 100 mL/hr over 30 Minutes Intravenous On call to O.R. 01/31/16 [id number] 01/31/16 [id number]      Assessment/Plan: s/p Procedure(s): XI ROBOT ASSISTED low anterior resection Doing well without apparent complication. Soft diet as ordered. Ambulation encouraged. Awaiting bowel function. Mild hypokalemia-replace   LOS: 3 days    Karen Good T 10/21/2017Patient ID: Karen Good, female   DOB:  1956-01-15, 60 y.o.   MRN: [id number]

## 2016-02-04 MED ORDER — HYDROCODONE-ACETAMINOPHEN 5-325 MG PO TABS: 1.0000 | ORAL_TABLET | Freq: Four times a day (QID) | ORAL | 0 refills | Status: DC | PRN

## 2016-02-04 NOTE — Progress Notes (Signed)
Patient ID: Karen Good, female   DOB: 08/23/1955, 60 y.o.   MRN: [id number]  4 Days Post-Op  Subjective: No complaints this morning. Tolerating a soft diet without difficulty. Has had bowel movements. Denies pain.  Objective: Vital signs in last 24 hours: Temp:  [97.9 F (36.6 C)-98.2 F (36.8 C)] 98.1 F (36.7 C) (10/22 0540) Pulse Rate:  [64-87] 64 (10/22 0540) Resp:  [15-16] 16 (10/22 0540) BP: (110-135)/(52-83) 135/66 (10/22 0540) SpO2:  [92 %-100 %] 92 % (10/22 0540) Last BM Date: 02/03/16  Intake/Output from previous day: 10/21 0701 - 10/22 0700 In: 2040 [P.O.:240; I.V.:1800] Out: 60 [Drains:60] Intake/Output this shift: No intake/output data recorded.  General appearance: alert, cooperative and no distress GI: normal findings: soft, non-tender Incision/Wound: Clean and dry. JP drainage is scant and serous.  Lab Results:   Recent Labs  02/02/16 0519 02/03/16 0439  WBC 4.0 3.4*  HGB 10.3* 10.1*  HCT 31.4* 31.2*  PLT 242 241   BMET  Recent Labs  02/02/16 0519 02/03/16 0439  NA 140 140  K 3.6 3.3*  CL 105 105  CO2 30 29  GLUCOSE 157* 139*  BUN 8 7  CREATININE 0.58 0.50  CALCIUM 9.0 8.8*     Studies/Results: No results found.  Anti-infectives: Anti-infectives    Start     Dose/Rate Route Frequency Ordered Stop   01/31/16 2100  cefoTEtan (CEFOTAN) 2 g in dextrose 5 % 50 mL IVPB     2 g 100 mL/hr over 30 Minutes Intravenous Every 12 hours 01/31/16 1449 01/31/16 2130   01/31/16 0622  cefoTEtan (CEFOTAN) 2 g in dextrose 5 % 50 mL IVPB     2 g 100 mL/hr over 30 Minutes Intravenous On call to O.R. 01/31/16 [id number] 01/31/16 [id number]      Assessment/Plan: s/p Procedure(s): XI ROBOT ASSISTED low anterior resection Doing very well without apparent complication. JP drain is removed. Okay for discharge today.   LOS: 4 days    Fritzi Scripter T 02/04/2016

## 2016-02-04 NOTE — Progress Notes (Signed)
Assessment unchanged. Pt and husband verbalized understanding of dc instructions through teach back including when to call the doctor and follow up care. Script x 1 given as provided by MD. JP drain dc'd prior to dc home. Gauze dressing applied with extra dressing supplies provided for home. Discharged via wc to front entrance accompanied by NT and husband.

## 2016-02-06 LAB — GLUCOSE, CAPILLARY
GLUCOSE-CAPILLARY: 102 mg/dL — AB (ref 65–99)
GLUCOSE-CAPILLARY: 122 mg/dL — AB (ref 65–99)
GLUCOSE-CAPILLARY: 145 mg/dL — AB (ref 65–99)
Glucose-Capillary: 162 mg/dL — ABNORMAL HIGH (ref 65–99)
Glucose-Capillary: 224 mg/dL — ABNORMAL HIGH (ref 65–99)
Glucose-Capillary: 158 mg/dL — ABNORMAL HIGH (ref 65–99)
Glucose-Capillary: 157 mg/dL — ABNORMAL HIGH (ref 65–99)
Glucose-Capillary: 147 mg/dL — ABNORMAL HIGH (ref 65–99)

## 2016-02-07 NOTE — Discharge Summary (Signed)
Patient ID: Karen Good [id number] 60 y.o. 1955-09-13  Admission date: 01/31/2016  Discharge date and time: 02/04/2016 10:24 AM  Admitting Physician: Rosario Adie  Discharge Physician: Rosario Adie.  Admission Diagnoses: Rectal cancer  Discharge Diagnoses: Stage 2 rectal cancer  Operations: Procedure(s): XI ROBOT ASSISTED low anterior resection    Discharged Condition: good    Hospital Course: Patient admitted after surgery.  Her diet was advanced as tolerated.  She began to have bowel function on POD 3.  She was discharged in stable condition on POD 4.    Consults: None  Significant Diagnostic Studies: labs  Treatments: IV hydration, analgesia: acetaminophen w/ codeine and surgery: robotic LAR  Disposition: Home

## 2016-02-16 ENCOUNTER — Telehealth: Payer: Self-pay | Admitting: *Deleted

## 2016-02-16 MED ORDER — HYDROCODONE-ACETAMINOPHEN 5-325 MG PO TABS: 1.0000 | ORAL_TABLET | Freq: Four times a day (QID) | ORAL | 0 refills | Status: DC | PRN

## 2016-02-16 NOTE — Telephone Encounter (Signed)
Done hardcopy to Corinne  

## 2016-02-16 NOTE — Telephone Encounter (Signed)
Rec'd call pt requesting refill on her Hydrocodone.../lmb 

## 2016-02-16 NOTE — Telephone Encounter (Signed)
Left patient message stating prescription was ready

## 2016-02-22 ENCOUNTER — Ambulatory Visit (HOSPITAL_BASED_OUTPATIENT_CLINIC_OR_DEPARTMENT_OTHER): Payer: 59 | Admitting: Hematology

## 2016-02-22 ENCOUNTER — Telehealth: Payer: Self-pay | Admitting: Hematology

## 2016-02-22 ENCOUNTER — Other Ambulatory Visit (HOSPITAL_BASED_OUTPATIENT_CLINIC_OR_DEPARTMENT_OTHER): Payer: 59

## 2016-02-22 VITALS — BP 152/66 | HR 100 | Temp 98.1°F | Resp 18 | Ht 65.0 in | Wt 163.0 lb

## 2016-02-22 DIAGNOSIS — E119 Type 2 diabetes mellitus without complications: Secondary | ICD-10-CM | POA: Diagnosis not present

## 2016-02-22 DIAGNOSIS — C20 Malignant neoplasm of rectum: Secondary | ICD-10-CM | POA: Diagnosis not present

## 2016-02-22 DIAGNOSIS — D509 Iron deficiency anemia, unspecified: Secondary | ICD-10-CM | POA: Diagnosis not present

## 2016-02-22 DIAGNOSIS — N649 Disorder of breast, unspecified: Secondary | ICD-10-CM | POA: Diagnosis not present

## 2016-02-22 DIAGNOSIS — I10 Essential (primary) hypertension: Secondary | ICD-10-CM

## 2016-02-22 DIAGNOSIS — D500 Iron deficiency anemia secondary to blood loss (chronic): Secondary | ICD-10-CM

## 2016-02-22 DIAGNOSIS — F419 Anxiety disorder, unspecified: Secondary | ICD-10-CM

## 2016-02-22 LAB — CBC WITH DIFFERENTIAL/PLATELET
BASO%: 0.3 % (ref 0.0–2.0)
Basophils Absolute: 0 10*3/uL (ref 0.0–0.1)
EOS%: 0.3 % (ref 0.0–7.0)
Eosinophils Absolute: 0 10*3/uL (ref 0.0–0.5)
HCT: 33.7 % — ABNORMAL LOW (ref 34.8–46.6)
HGB: 10.8 g/dL — ABNORMAL LOW (ref 11.6–15.9)
LYMPH%: 7.3 % — AB (ref 14.0–49.7)
MCH: 28.7 pg (ref 25.1–34.0)
MCHC: 32.1 g/dL (ref 31.5–36.0)
MCV: 89.2 fL (ref 79.5–101.0)
MONO#: 0.3 10*3/uL (ref 0.1–0.9)
MONO%: 5 % (ref 0.0–14.0)
NEUT%: 87.1 % — ABNORMAL HIGH (ref 38.4–76.8)
NEUTROS ABS: 5 10*3/uL (ref 1.5–6.5)
Platelets: 338 10*3/uL (ref 145–400)
RBC: 3.77 10*6/uL (ref 3.70–5.45)
RDW: 13.5 % (ref 11.2–14.5)
WBC: 5.8 10*3/uL (ref 3.9–10.3)
lymph#: 0.4 10*3/uL — ABNORMAL LOW (ref 0.9–3.3)

## 2016-02-22 LAB — COMPREHENSIVE METABOLIC PANEL
ALT: 9 U/L (ref 0–55)
AST: 10 U/L (ref 5–34)
Albumin: 3.6 g/dL (ref 3.5–5.0)
Alkaline Phosphatase: 89 U/L (ref 40–150)
Anion Gap: 12 mEq/L — ABNORMAL HIGH (ref 3–11)
BILIRUBIN TOTAL: 0.53 mg/dL (ref 0.20–1.20)
BUN: 12.5 mg/dL (ref 7.0–26.0)
CHLORIDE: 106 meq/L (ref 98–109)
CO2: 25 meq/L (ref 22–29)
Calcium: 9.6 mg/dL (ref 8.4–10.4)
Creatinine: 0.7 mg/dL (ref 0.6–1.1)
GLUCOSE: 81 mg/dL (ref 70–140)
Potassium: 3.2 mEq/L — ABNORMAL LOW (ref 3.5–5.1)
SODIUM: 142 meq/L (ref 136–145)
TOTAL PROTEIN: 7.2 g/dL (ref 6.4–8.3)

## 2016-02-22 NOTE — Telephone Encounter (Signed)
Appointments scheduled per 11/9 LOS. Patient given AVS report and calendars with future scheduled appointments. °

## 2016-02-22 NOTE — Progress Notes (Signed)
[place]  Telephone:([phone]) [phone] Fax:([phone]) [phone]  Clinic Follow up Note   Patient Care Team: Karen Borg, MD as PCP - General Karen Rudd, MD as Consulting Physician (Radiation Oncology) Karen Ruff, MD as Consulting Physician (General Surgery) Karen Banister, MD as Attending Physician (Gastroenterology) Karen Ade, RN as Registered Nurse 02/22/2016   CHIEF COMPLAINTS:  Follow up rectal cancer  Oncology History   Rectal cancer [facility])   Staging form: Colon and Rectum, AJCC 7th Edition   - Clinical: Stage IIA (T3, N0, M0) - Signed by [name] Merle, MD on 10/10/2015   - Pathologic stage from 01/31/2016: Stage IIA (T3, N0, cM0) - Signed by [name] Merle, MD on 02/22/2016       Rectal cancer ([place])   09/22/2015 Imaging    CT ABD/PELVIS: Soft tissue fullness at the rectosigmoid junction. Otherwise, no acute process in the abdomen or pelvis      09/24/2015 Pathology Results    Adenocarcinoma      09/24/2015 Initial Diagnosis    Rectal cancer ([place])      09/24/2015 Procedure    COLONOSCOPY: Nonobsructive mass in rectosigmoid colon from 10-20 cum, circumferential (Dr. Collene Good)      09/24/2015 Tumor Marker    CEA=4.1      09/25/2015 Imaging    CT CHEST: Mass within the upper-outer quadrant of the left breast warranting further evaluation. No suspicious pulmonary abnormality      10/25/2015 - 12/01/2015 Radiation Therapy    adjuvant irradiation to rectal cancer      10/25/2015 - 12/01/2015 Chemotherapy    Xeloda 1500 mg twice daily, with concurrent irradiation      01/31/2016 Surgery    Robot assisted low anterior resection of rectal cancer       01/31/2016 Pathology Results    Invasive colorectal adenocarcinoma, 3.5cm, G2, LVI(-), peri-neural invasion (-), ypT3, margins negative, 17 nodes all negative      ' HISTORY OF PRESENTING ILLNESS:  Karen Good 60 y.o. female is here because of her newly diagnosed rectal cancer. She presents to my clinic with  her husband.  She had had intermittent bloody stool for one month, somewhat mount, mixed with stool, with worsening fatigue, and 6 lbs weight loss, no significant abdominal pain, nausea, dyspnea, or other symptoms. She was seen by her PCP Dr. Jenny Good and lab work showed anemia. She was sent to ED on 6/9 and received blood transfusion for Hb 6.9. She underwent a colonoscopy which showed a partially obstructive proximal rectal mass, biopsy showed adenocarcinoma. She was subsequently referred to Dr. Ardis Good and underwent EUS which showed a T3 N0 rectal mass. She was seen by [facility] Dr. Marcello Good last week, and is also scheduled to see radiation oncologist Dr. Lisbeth Good tomorrow.  She has felt much better overall after blood transfusion. She did receive IV ferric gluconate 125 mg in the hospital, but is not on oral iron supplements. She feels well overall, has good appetite and energy level, but does feel anxious since her cancer diagnosis.  CURRENT THERAPY: pending adjuvant xeloda   INTERIM HISTORY: [name] returns for follow-up. She is accompanied by her husband to my clinic today. She underwent low anterior resection of her rectal cancer on 01/31/2016, and has recovered well from surgery. She has minimal pain at the incision site, bowel movements normal, no other new complaints, her appetite and energy level has been back to normal.   MEDICAL HISTORY:  Past Medical History:  Diagnosis Date  .  Anemia   . BACK PAIN 04/13/2010  . BACK STRAIN, LUMBAR 04/13/2010  . Cancer (HCC)    rectal  . DIABETES MELLITUS, TYPE II 06/02/2008  . History of blood transfusion   . History of chemotherapy   . History of radiation therapy   . HYPERLIPIDEMIA 06/02/2008  . HYPERTENSION 06/02/2008    SURGICAL HISTORY: Past Surgical History:  Procedure Laterality Date  . COLONOSCOPY Left 09/24/2015   Procedure: COLONOSCOPY;  [place]: Karen Craver, MD;  Location: [facility] ENDOSCOPY;  Service: Endoscopy;  Laterality: Left;  .  ESOPHAGOGASTRODUODENOSCOPY N/A 09/24/2015   Procedure: ESOPHAGOGASTRODUODENOSCOPY (EGD);  [place]: Karen Craver, MD;  Location: [facility] ENDOSCOPY;  Service: Endoscopy;  Laterality: N/A;  . EUS N/A 10/05/2015   Procedure: LOWER ENDOSCOPIC ULTRASOUND (EUS);  [place]: Karen Banister, MD;  Location: [facility] ENDOSCOPY;  Service: Endoscopy;  Laterality: N/A;  . s/p breast biopsy  4/07   benign  . TUBAL LIGATION      SOCIAL HISTORY: Social History   Social History  . Marital status: Single    Spouse name: N/A  . Number of children: 2  . Years of education: N/A   Occupational History  .  [employer]   Social History Main Topics  . Smoking status: Never Smoker  . Smokeless tobacco: Never Used  . Alcohol use Yes     Comment: socially  . Drug use: No  . Sexual activity: Yes    Birth control/ protection: Post-menopausal   Other Topics Concern  . Not on file   Social History Narrative  . No narrative on file    FAMILY HISTORY: Family History  Problem Relation Age of Onset  . Diabetes Mother   . Heart disease Father   . Diabetes Father   . Hypertension Father   . Diabetes Sister   . Diabetes Other   . Diabetes Brother     ALLERGIES:  has No Known Allergies.  MEDICATIONS:  Current Outpatient Prescriptions  Medication Sig Dispense Refill  . amLODipine (NORVASC) 10 MG tablet Take 1 tablet (10 mg total) by mouth daily. 30 tablet 10  . atenolol (TENORMIN) 25 MG tablet Take 0.5 tablets (12.5 mg total) by mouth 2 (two) times daily. 60 tablet 2  . atorvastatin (LIPITOR) 20 MG tablet Take 1 tablet (20 mg total) by mouth daily. (Patient taking differently: Take 20 mg by mouth daily at 6 PM. ) 90 tablet 3  . clonazePAM (KLONOPIN) 0.5 MG tablet Take 1 tablet (0.5 mg total) by mouth 2 (two) times daily as needed (anxiety). (Patient not taking: Reported on 01/16/2016) 30 tablet 0  . GLIPIZIDE XL 5 MG 24 hr tablet TAKE TWO TABLETS BY MOUTH DAILY 180 tablet 2  . hydrochlorothiazide (HYDRODIURIL) 25  MG tablet Take 1 tablet (25 mg total) by mouth daily. 30 tablet 5  . HYDROcodone-acetaminophen (NORCO) 5-325 MG tablet Take 1 tablet by mouth every 6 (six) hours as needed for moderate pain. To last 30 days 30 tablet 0  . ibuprofen (ADVIL,MOTRIN) 200 MG tablet Take 400 mg by mouth every 6 (six) hours as needed for mild pain.    Marland Kitchen irbesartan (AVAPRO) 300 MG tablet Take 1 tablet (300 mg total) by mouth at bedtime. 90 tablet 3  . KLOR-CON M20 20 MEQ tablet TAKE 1 TABLET EVERY DAY 60 tablet 0  . pioglitazone (ACTOS) 45 MG tablet TAKE 1 TABLET (45 MG TOTAL) BY MOUTH DAILY.  3  . sitaGLIPtin-metformin (JANUMET) 50-1000 MG tablet Take 1 tablet by mouth  2 (two) times daily. 180 tablet 3   No current facility-administered medications for this visit.     REVIEW OF SYSTEMS:   Constitutional: Denies fevers, chills or abnormal night sweats Eyes: Denies blurriness of vision, double vision or watery eyes Ears, nose, mouth, throat, and face: Denies mucositis or sore throat Respiratory: Denies cough, dyspnea or wheezes Cardiovascular: Denies palpitation, chest discomfort or lower extremity swelling Gastrointestinal:  Denies nausea, heartburn or change in bowel habits Skin: Denies abnormal skin rashes Lymphatics: Denies new lymphadenopathy or easy bruising Neurological:Denies numbness, tingling or new weaknesses Behavioral/Psych: Mood is stable, no new changes  All other systems were reviewed with the patient and are negative.  PHYSICAL EXAMINATION: ECOG PERFORMANCE STATUS: 0 - Asymptomatic  Vitals:   02/22/16 1524 02/22/16 1527  BP: (!) 152/60 (!) 152/66  Pulse: 100   Resp: 18   Temp: 98.1 F (36.7 C)    Filed Weights   02/22/16 1524  Weight: 163 lb (73.9 kg)    GENERAL:alert, no distress and comfortable SKIN: skin color, texture, turgor are normal, no rashes or significant lesions EYES: normal, conjunctiva are pink and non-injected, sclera clear OROPHARYNX:no exudate, no erythema and lips,  buccal mucosa, and tongue normal  NECK: supple, thyroid normal size, non-tender, without nodularity LYMPH:  no palpable lymphadenopathy in the cervical, axillary or inguinal LUNGS: clear to auscultation and percussion with normal breathing effort HEART: regular rate & rhythm, 2/6 systolic murmurs, no lower extremity edema ABDOMEN:abdomen soft, non-tender and normal bowel sounds, no organomegaly. Surgical incisions have healed well. Rectal exam was not performed. Musculoskeletal:no cyanosis of digits and no clubbing  PSYCH: alert & oriented x 3 with fluent speech NEURO: no focal motor/sensory deficits Breast exam: deferred   LABORATORY DATA:  I have reviewed the data as listed CBC Latest Ref Rng & Units 02/22/2016 02/03/2016 02/02/2016  WBC 3.9 - 10.3 10e3/uL 5.8 3.4(L) 4.0  Hemoglobin 11.6 - 15.9 g/dL 10.8(L) 10.1(L) 10.3(L)  Hematocrit 34.8 - 46.6 % 33.7(L) 31.2(L) 31.4(L)  Platelets 145 - 400 10e3/uL 338 241 242   CMP Latest Ref Rng & Units 02/22/2016 02/03/2016 02/02/2016  Glucose 70 - 140 mg/dl 81 139(H) 157(H)  BUN 7.0 - 26.0 mg/dL 12._0 Creatinine 0.6 - 1.1 mg/dL 0.7 0.50 0.58  Sodium 136 - 145 mEq/L 142 140 140  Potassium 3.5 - 5.1 mEq/L 3.2(L) 3.3(L) 3.6  Chloride 101 - 111 mmol/L - 105 105  CO2 22 - 29 mEq/L _1 Calcium 8.4 - 10.4 mg/dL 9.6 8.8(L) 9.0  Total Protein 6.4 - 8.3 g/dL 7.2 - -  Total Bilirubin 0.20 - 1.20 mg/dL 0.53 - -  Alkaline Phos 40 - 150 U/L 89 - -  AST 5 - 34 U/L 10 - -  ALT 0 - 55 U/L 9 - -   CEA:  09/24/2015: 4.1   PATHOLOGY REPORT  Diagnosis 09/24/2015 Colon, biopsy, Rectal sigmoid mass - ADENOCARCINOMA. - SEE COMMENT. Microscopic Comment Dr. Orene Good has reviewed the case and concurs with this interpretation. Dr. Collene Good was paged on 09/26/15. (JBK:gt, 09/26/15)  Diagnosis 01/31/2016 Colon, segmental resection for tumor, rectosigmoid - INVASIVE COLORECTAL ADENOCARCINOMA, 3.5 CM EXTENDING INTO PERIRECTAL CONNECTIVE TISSUE. - MARGINS NOT  INVOLVED. - SEVENTEEN BENIGN LYMPH NODE (0/17). Microscopic Comment COLON AND RECTUM (INCLUDING TRANS-ANAL RESECTION): Specimen: Rectosigmoid colon. Procedure: Resection. Tumor site: Proximal rectum. Specimen integrity: Intact. Macroscopic intactness of mesorectum: Complete. Macroscopic tumor perforation: No. Invasive tumor: Maximum size: 3.5 cm. Histologic type(s): Colorectal adenocarcinoma. Histologic grade and  differentiation: G2: moderately differentiated/low grade Type of polyp in which invasive carcinoma arose: No residual polyp. Microscopic extension of invasive tumor: Into perirectal connective tissue. Lymph-Vascular invasion: Not identified. Peri-neural invasion: No. Tumor deposit(s) (discontinuous extramural extension): No Resection margins: Proximal margin: Free of tumor. Distal margin: Free of tumor. Circumferential (radial) (posterior ascending, posterior descending; lateral and posterior mid-rectum; and entire lower 1/3 rectum): Free of tumor. Mesenteric margin (sigmoid and transverse): N/A 1 of 5 Amended copy Amended FINAL for [place], Karen N [phone]) Microscopic Comment(continued) Distance closest margin (if all above margins negative): 3 cm from circumferential radial margin. Treatment effect (neo-adjuvant therapy): Yes. Additional polyp(s): Tubular adenoma with high grade dysplasia. Non-neoplastic findings: N/A Lymph nodes: number examined - 17; number positive: 0 Pathologic Staging: ypT3, ypN0, ypMX Ancillary studies: Microsatellite instability by PCR and mismatch repair protein by immunohistochemistry. (JDP:gt, 02/01/16)   RADIOGRAPHIC STUDIES: I have personally reviewed the radiological images as listed and agreed with the findings in the report. No results found.  Diagnostic mammogram of left breast 12/12/2015 IMPRESSION: The asymmetry in the upper-outer quadrant of the left breast corresponds with the finding on the patient's CT. This has  been stable well over 2 years (since 2011), consistent with a benign finding.  RECOMMENDATION: Screening mammogram in one year.(Code:SM-B-01Y)  I have discussed the findings and recommendations with the patient. Results were also provided in writing at the conclusion of the visit. If applicable, a reminder letter will be sent to the patient regarding the next appointment.  BI-RADS CATEGORY  2: Benign.  EUS 10/05/2015 Dr. Ardis Good  IMPRESSION:  Partially circumferential, 6cm long uT3N0 rectosigmoid adenocarcinoma with distal edge located 9cm from the anal verge. Submucosal injections of [place] Ink, at proximal and distal edges of the mass.  Colonoscopy and EGD 09/24/2015 Dr. Collene Good  - Large circumferental polypoid mass in the rectosigmoid colon from 10-20 cm-biopsies Done. - Normal appearing, widely patent esophagus and GEJ. - Small hiatal hernia noted on retroflexion. - Mild antral gastritis-otherwise normal appearing stomach. - Normal examined duodenum. - No specimens collected.  ASSESSMENT & PLAN: 60 year old female with past medical history of diabetes and hypertension, presented with rectal bleeding. Colonoscopy showed a large proximal rectal mass.  1. Rectal adenocarcinoma, proximal rectum, uT3N0M0, stage IIA, ypT3N0M0  -I have reviewed her colonoscopy, EUS, and CT of abdomen pelvis finding with pt in details -The EUS showed a T3 lesion, no suspicious lymphadenopathy. The CT scan showed no evidence of adenopathy or distant metastasis. She has stage IIA disease -We reviewed the natural history of rectal cancer and risk of recurrence after surgery. Given the stage II a disease, she likely has moderate risk of recurrence. We discussed the treatment option for stage IIA rectal cancer. The standard of care is neoadjuvant chemotherapy and radiation, followed by surgery, then adjuvant chemotherapy. -She completed neoadjuvant chemoradiation, tolerated it very well -I discussed her  surgical pathology findings with patient and her husband in details, she had minimal response to neoadjuvant chemotherapy and radiation, still has residual T3 disease, no lymph node metastasis. -We discussed the role of adjuvant chemotherapy in rectal cancer, given the significant residual disease, I recommend adjuvant Xeloda 1062m/m2 bid for 14 days, then 7 days off, for 4 and a half months. -she agrees with the plan, will start on 11/27.   2. Iron deficient anemia secondary to GI bleeding -Her recent iron study was consistent with iron deficient anemia. -She has received blood transfusion and iv feraheme twice  -continue oral ferrous sulfate 1 tablet a  day  3. Anxiety  -Secondary to cancer diagnosis. -Improved lately.  4. HTN and DM -Continue medication. She'll follow-up with her primary care physician -I discussed the impact of chemotherapy on her blood pressure and diabetes, we'll monitor her blood pressure and glucose level closely, and is just the medication if needed. -Her blood pressure has been high lately, I encouraged her to monitor at home and follow-up with her primary care physician  5. Left breast mass -CT chest revealed a left breast mass, however her screening mammogram was negative in 09/2015 -Her diagnostic mammogram showed the left breast lesion has been stable for a few years, likely benign, no biopsy was recommended.  Plan -She will start adjvant Xeloda 2051m in am, 15036min evening, 2 weeks on, 1 week off, starting on 11/27  -I plan to see her back the week before her second cycle Xeloda   All questions were answered. The patient knows to call the clinic with any problems, questions or concerns.  I spent 20 minutes counseling the patient face to face. The total time spent in the appointment was 25 minutes and more than 50% was on counseling.     FeTruitt MerleMD 02/22/2016 4:09 PM

## 2016-02-23 ENCOUNTER — Telehealth: Payer: Self-pay | Admitting: *Deleted

## 2016-02-23 LAB — CEA (IN HOUSE-CHCC): CEA (CHCC-IN HOUSE): 1.05 ng/mL (ref 0.00–5.00)

## 2016-02-23 LAB — FERRITIN: Ferritin: 79 ng/ml (ref 9–269)

## 2016-02-23 MED ORDER — CAPECITABINE 500 MG PO TABS: ORAL_TABLET | ORAL | 1 refills | Status: DC

## 2016-02-23 NOTE — Telephone Encounter (Signed)
Oncology Nurse Navigator Documentation  Oncology Nurse Navigator Flowsheets 02/23/2016  Navigator Location [facility]  Referral date to RadOnc/MedOnc -  Navigator Encounter Type Telephone  Telephone Outgoing Call;Patient Update  Abnormal Finding Date -  Confirmed Diagnosis Date -  Treatment Initiated Date -  Patient Visit Type -  Treatment Phase -  Barriers/Navigation Needs Family concerns--confirmed her start date for Xeloda is 11/27 and she last received the drug from [place].  Interventions Coordination of Care--message to MD to order Xeloda  Acuity Level 1  Time Spent with Patient 15  Provided contact # for her to reach navigator if she has not heard from pharmacy about her script within a week of strt date.

## 2016-02-25 ENCOUNTER — Encounter: Payer: Self-pay | Admitting: Hematology

## 2016-03-11 ENCOUNTER — Other Ambulatory Visit: Payer: Self-pay | Admitting: Hematology

## 2016-03-11 DIAGNOSIS — C20 Malignant neoplasm of rectum: Secondary | ICD-10-CM

## 2016-03-11 DIAGNOSIS — E876 Hypokalemia: Secondary | ICD-10-CM

## 2016-03-16 ENCOUNTER — Other Ambulatory Visit: Payer: Self-pay

## 2016-03-16 NOTE — Telephone Encounter (Signed)
Pt rq rf of Hydrocodone.   Does pt need labs before next appt. Please advise.

## 2016-03-18 NOTE — Telephone Encounter (Signed)
Left msg on triage requesting refill on her Hydrocodone. MD out of office today will hold until MD returns tomorrow...Johny Chess

## 2016-03-19 ENCOUNTER — Telehealth: Payer: Self-pay

## 2016-03-19 MED ORDER — HYDROCODONE-ACETAMINOPHEN 5-325 MG PO TABS: 1.0000 | ORAL_TABLET | Freq: Four times a day (QID) | ORAL | 0 refills | Status: DC | PRN

## 2016-03-19 NOTE — Telephone Encounter (Signed)
Pt has insurance forms for coverage if she is out of work it will cover bills. She is asking how to get them to Dr Burr Medico. Instructed to give them to [facility] and she will get them to Dr Burr Medico.

## 2016-03-19 NOTE — Telephone Encounter (Signed)
Patient aware.

## 2016-03-19 NOTE — Telephone Encounter (Signed)
Done hardcopy to Corinne  

## 2016-03-20 ENCOUNTER — Other Ambulatory Visit (INDEPENDENT_AMBULATORY_CARE_PROVIDER_SITE_OTHER): Payer: 59

## 2016-03-20 ENCOUNTER — Encounter: Payer: Self-pay | Admitting: Internal Medicine

## 2016-03-20 DIAGNOSIS — E119 Type 2 diabetes mellitus without complications: Secondary | ICD-10-CM | POA: Diagnosis not present

## 2016-03-20 LAB — BASIC METABOLIC PANEL
BUN: 14 mg/dL (ref 6–23)
CHLORIDE: 104 meq/L (ref 96–112)
CO2: 28 meq/L (ref 19–32)
CREATININE: 0.52 mg/dL (ref 0.40–1.20)
Calcium: 9.3 mg/dL (ref 8.4–10.5)
GFR: 126.81 mL/min (ref >60.00)
Glucose, Bld: 117 mg/dL — ABNORMAL HIGH (ref 70–99)
POTASSIUM: 3.4 meq/L — AB (ref 3.5–5.1)
Sodium: 140 mEq/L (ref 135–145)

## 2016-03-20 LAB — HEPATIC FUNCTION PANEL
ALBUMIN: 4.1 g/dL (ref 3.5–5.2)
ALK PHOS: 58 U/L (ref 39–117)
ALT: 9 U/L (ref 0–35)
AST: 10 U/L (ref 0–37)
BILIRUBIN TOTAL: 0.8 mg/dL (ref 0.2–1.2)
Bilirubin, Direct: 0.2 mg/dL (ref 0.0–0.3)
Total Protein: 6.8 g/dL (ref 6.0–8.3)

## 2016-03-20 LAB — LIPID PANEL
Cholesterol: 133 mg/dL (ref 0–200)
HDL: 48.7 mg/dL (ref >39.00)
LDL Cholesterol: 68 mg/dL (ref 0–99)
NONHDL: 83.87
TRIGLYCERIDES: 80 mg/dL (ref 0.0–149.0)
Total CHOL/HDL Ratio: 3
VLDL: 16 mg/dL (ref 0.0–40.0)

## 2016-03-20 LAB — HEMOGLOBIN A1C: HEMOGLOBIN A1C: 6.3 % (ref 4.6–6.5)

## 2016-03-22 ENCOUNTER — Ambulatory Visit (INDEPENDENT_AMBULATORY_CARE_PROVIDER_SITE_OTHER): Payer: 59 | Admitting: Internal Medicine

## 2016-03-22 ENCOUNTER — Encounter: Payer: Self-pay | Admitting: Internal Medicine

## 2016-03-22 VITALS — BP 138/70 | HR 100 | Temp 98.4°F | Resp 20 | Wt 163.0 lb

## 2016-03-22 DIAGNOSIS — Z0001 Encounter for general adult medical examination with abnormal findings: Secondary | ICD-10-CM

## 2016-03-22 DIAGNOSIS — E119 Type 2 diabetes mellitus without complications: Secondary | ICD-10-CM | POA: Diagnosis not present

## 2016-03-22 DIAGNOSIS — I10 Essential (primary) hypertension: Secondary | ICD-10-CM

## 2016-03-22 DIAGNOSIS — E785 Hyperlipidemia, unspecified: Secondary | ICD-10-CM | POA: Diagnosis not present

## 2016-03-22 NOTE — Progress Notes (Signed)
Subjective:    Patient ID: Karen Good, female    DOB: Jan 29, 1956, 60 y.o.   MRN: [id number]  HPI   Here to f/u; overall doing ok,  Pt denies chest pain, increasing sob or doe, wheezing, orthopnea, PND, [name], palpitations, dizziness or syncope.  Pt denies new neurological symptoms such as new headache, or facial or extremity weakness or numbness.  Pt denies polydipsia, polyuria, or low sugar episode.   Pt denies new neurological symptoms such as new headache, or facial or extremity weakness or numbness.   Pt states overall good compliance with meds, mostly trying to follow appropriate diet, with wt overall stable,  but little exercise however.  No other new history Wt Readings from Last 3 Encounters:  03/22/16 163 lb (73.9 kg)  02/22/16 163 lb (73.9 kg)  01/31/16 164 lb (74.4 kg)   Past Medical History:  Diagnosis Date  . Anemia   . BACK PAIN 04/13/2010  . BACK STRAIN, LUMBAR 04/13/2010  . Cancer (HCC)    rectal  . DIABETES MELLITUS, TYPE II 06/02/2008  . History of blood transfusion   . History of chemotherapy   . History of radiation therapy   . HYPERLIPIDEMIA 06/02/2008  . HYPERTENSION 06/02/2008   Past Surgical History:  Procedure Laterality Date  . COLONOSCOPY Left 09/24/2015   Procedure: COLONOSCOPY;  [place]: Juanita Craver, MD;  Location: [facility] ENDOSCOPY;  Service: Endoscopy;  Laterality: Left;  . ESOPHAGOGASTRODUODENOSCOPY N/A 09/24/2015   Procedure: ESOPHAGOGASTRODUODENOSCOPY (EGD);  [place]: Juanita Craver, MD;  Location: [facility] ENDOSCOPY;  Service: Endoscopy;  Laterality: N/A;  . EUS N/A 10/05/2015   Procedure: LOWER ENDOSCOPIC ULTRASOUND (EUS);  [place]: Milus Banister, MD;  Location: [facility] ENDOSCOPY;  Service: Endoscopy;  Laterality: N/A;  . s/p breast biopsy  4/07   benign  . TUBAL LIGATION      reports that she has never smoked. She has never used smokeless tobacco. She reports that she drinks alcohol. She reports that she does not use drugs. family history  includes Diabetes in her brother, father, mother, other, and sister; Heart disease in her father; Hypertension in her father. No Known Allergies' Current Outpatient Prescriptions on File Prior to Visit  Medication Sig Dispense Refill  . amLODipine (NORVASC) 10 MG tablet Take 1 tablet (10 mg total) by mouth daily. 30 tablet 10  . atenolol (TENORMIN) 25 MG tablet Take 0.5 tablets (12.5 mg total) by mouth 2 (two) times daily. 60 tablet 2  . atorvastatin (LIPITOR) 20 MG tablet Take 1 tablet (20 mg total) by mouth daily. (Patient taking differently: Take 20 mg by mouth daily at 6 PM. ) 90 tablet 3  . capecitabine (XELODA) 500 MG tablet Take 4 tab in morning and 3 tab in evening, for 2 weeks on and one week off 98 tablet 1  . clonazePAM (KLONOPIN) 0.5 MG tablet Take 1 tablet (0.5 mg total) by mouth 2 (two) times daily as needed (anxiety). 30 tablet 0  . GLIPIZIDE XL 5 MG 24 hr tablet TAKE TWO TABLETS BY MOUTH DAILY 180 tablet 2  . hydrochlorothiazide (HYDRODIURIL) 25 MG tablet Take 1 tablet (25 mg total) by mouth daily. 30 tablet 5  . HYDROcodone-acetaminophen (NORCO) 5-325 MG tablet Take 1 tablet by mouth every 6 (six) hours as needed for moderate pain. To last 30 days 30 tablet 0  . ibuprofen (ADVIL,MOTRIN) 200 MG tablet Take 400 mg by mouth every 6 (six) hours as needed for mild pain.    Marland Kitchen irbesartan (  AVAPRO) 300 MG tablet Take 1 tablet (300 mg total) by mouth at bedtime. 90 tablet 3  . KLOR-CON M20 20 MEQ tablet TAKE 1 TABLET EVERY DAY 60 tablet 0  . pioglitazone (ACTOS) 45 MG tablet TAKE 1 TABLET (45 MG TOTAL) BY MOUTH DAILY.  3  . sitaGLIPtin-metformin (JANUMET) 50-1000 MG tablet Take 1 tablet by mouth 2 (two) times daily. 180 tablet 3  . [DISCONTINUED] nebivolol (BYSTOLIC) 10 MG tablet Take 1 tablet (10 mg total) by mouth daily. 90 tablet 3  . [DISCONTINUED] valsartan (DIOVAN) 320 MG tablet Take 1 tablet (320 mg total) by mouth daily. 90 tablet 3   No current facility-administered medications on  file prior to visit.    Review of Systems  Constitutional: Negative for unusual diaphoresis or night sweats HENT: Negative for ear swelling or discharge Eyes: Negative for worsening visual haziness  Respiratory: Negative for choking and stridor.   Gastrointestinal: Negative for distension or worsening eructation Genitourinary: Negative for retention or change in urine volume.  Musculoskeletal: Negative for other MSK pain or swelling Skin: Negative for color change and worsening wound Neurological: Negative for tremors and numbness other than noted  Psychiatric/Behavioral: Negative for decreased concentration or agitation other than above   All other system neg per pt    Objective:   Physical Exam BP 138/70   Pulse 100   Temp 98.4 F (36.9 C) (Oral)   Resp 20   Wt 163 lb (73.9 kg)   SpO2 97%   BMI 27.12 kg/m  VS noted,  Constitutional: Pt appears in no apparent distress HENT: Head: NCAT.  Right Ear: External ear normal.  Left Ear: External ear normal.  Eyes: . Pupils are equal, round, and reactive to light. Conjunctivae and EOM are normal Neck: Normal range of motion. Neck supple.  Cardiovascular: Normal rate and regular rhythm.   Pulmonary/Chest: Effort normal and breath sounds without rales or wheezing.  Neurological: Pt is alert. Not confused , motor grossly intact Skin: Skin is warm. No rash, no LE edema Psychiatric: Pt behavior is normal. No agitation. No other enw exam findings     Assessment & Plan:

## 2016-03-22 NOTE — Patient Instructions (Signed)
Please continue all other medications as before, and refills have been done if requested.  Please have the pharmacy call with any other refills you may need.  Please continue your efforts at being more active, low cholesterol diet, and weight control..  Please keep your appointments with your specialists as you may have planned  Please return in 6 months, or sooner if needed, with Lab testing done 3-5 days before  

## 2016-03-22 NOTE — Progress Notes (Signed)
Pre visit review using our clinic review tool, if applicable. No additional management support is needed unless otherwise documented below in the visit note. 

## 2016-03-23 NOTE — Assessment & Plan Note (Signed)
stable overall by history and exam, recent data reviewed with pt, and pt to continue medical treatment as before,  to f/u any worsening symptoms or concerns / Lab Results  Component Value Date   LDLCALC 68 03/20/2016

## 2016-03-23 NOTE — Assessment & Plan Note (Signed)
stable overall by history and exam, recent data reviewed with pt, and pt to continue medical treatment as before,  to f/u any worsening symptoms or concerns BP Readings from Last 3 Encounters:  03/22/16 138/70  02/22/16 (!) 152/66  02/04/16 138/60

## 2016-03-23 NOTE — Assessment & Plan Note (Signed)
stable overall by history and exam, recent data reviewed with pt, and pt to continue medical treatment as before,  to f/u any worsening symptoms or concerns , Lab Results  Component Value Date   HGBA1C 6.3 03/20/2016

## 2016-03-25 ENCOUNTER — Encounter: Payer: Self-pay | Admitting: Hematology

## 2016-03-25 NOTE — Progress Notes (Signed)
Faxed disability paperwork to [facility].

## 2016-03-27 NOTE — Progress Notes (Signed)
[place]  [phone] Fax:([phone]) [phone]  Clinic Follow up Note   Patient Care Team: Karen Borg, MD as PCP - General Karen Rudd, MD as Consulting Physician (Radiation Oncology) Karen Ruff, MD as Consulting Physician (General Surgery) Karen Banister, MD as Attending Physician (Gastroenterology) Karen Ade, RN as Registered Nurse 03/28/2016   CHIEF COMPLAINTS:  Follow up rectal cancer  Oncology History   Rectal cancer [facility])   Staging form: Colon and Rectum, AJCC 7th Edition   - Clinical: Stage IIA (T3, N0, M0) - Signed by Karen Merle, MD on 10/10/2015   - Pathologic stage from 01/31/2016: Stage IIA (T3, N0, cM0) - Signed by Karen Merle, MD on 02/22/2016       Rectal cancer ([place])   09/22/2015 Imaging    CT ABD/PELVIS: Soft tissue fullness at the rectosigmoid junction. Otherwise, no acute process in the abdomen or pelvis      09/24/2015 Pathology Results    Adenocarcinoma      09/24/2015 Initial Diagnosis    Rectal cancer ([place])      09/24/2015 Procedure    COLONOSCOPY: Nonobsructive mass in rectosigmoid colon from 10-20 cum, circumferential (Karen Good)      09/24/2015 Tumor Marker    CEA=4.1      09/25/2015 Imaging    CT CHEST: Mass within the upper-outer quadrant of the left breast warranting further evaluation. No suspicious pulmonary abnormality      10/25/2015 - 12/01/2015 Radiation Therapy    adjuvant irradiation to rectal cancer      10/25/2015 - 12/01/2015 Chemotherapy    Xeloda 1500 mg twice daily, with concurrent irradiation      01/31/2016 Surgery    Robot assisted low anterior resection of rectal cancer       01/31/2016 Pathology Results    Invasive colorectal adenocarcinoma, 3.5cm, G2, LVI(-), peri-neural invasion (-), ypT3, margins negative, 17 nodes all negative      ' HISTORY OF PRESENTING ILLNESS:  Karen Good File 60 y.o. female is here because of her newly diagnosed rectal cancer. She presents to my clinic with  her husband.  She had had intermittent bloody stool for one month, somewhat mount, mixed with stool, with worsening fatigue, and 6 lbs weight loss, no significant abdominal pain, nausea, dyspnea, or other symptoms. She was seen by her PCP Karen Good and lab work showed anemia. She was sent to ED on 6/9 and received blood transfusion for Hb 6.9. She underwent a colonoscopy which showed a partially obstructive proximal rectal mass, biopsy showed adenocarcinoma. She was subsequently referred to Dr. Ardis Good and underwent EUS which showed a T3 N0 rectal mass. She was seen by Karen Good last week, and is also scheduled to see radiation oncologist Dr. Lisbeth Good tomorrow.  She has felt much better overall after blood transfusion. She did receive IV ferric gluconate 125 mg in the hospital, but is not on oral iron supplements. She feels well overall, has good appetite and energy level, but does feel anxious since her cancer diagnosis.  CURRENT THERAPY: Adjvant Xeloda '2000mg'$  in am, '1500mg'$  in evening, 2 weeks on, 1 week off; started on 03/11/2016  INTERIM HISTORY: Karen Good returns for follow-up. The patient reports she is doing well after her first cycle of Xeloda. The patient denies nausea. Reports a good appetite. A fine energy level. She is not back to work yet and plans to go back May 06, 2016. The patient brought a form regarding short term disability benefits, but it  is directed to Dr. Marcello Good.  MEDICAL HISTORY:  Past Medical History:  Diagnosis Date  . Anemia   . BACK PAIN 04/13/2010  . BACK STRAIN, LUMBAR 04/13/2010  . Cancer (HCC)    rectal  . DIABETES MELLITUS, TYPE II 06/02/2008  . History of blood transfusion   . History of chemotherapy   . History of radiation therapy   . HYPERLIPIDEMIA 06/02/2008  . HYPERTENSION 06/02/2008    SURGICAL HISTORY: Past Surgical History:  Procedure Laterality Date  . COLONOSCOPY Left 09/24/2015   Procedure: COLONOSCOPY;  [place]: Karen Craver, MD;  Location:  [facility] ENDOSCOPY;  Service: Endoscopy;  Laterality: Left;  . ESOPHAGOGASTRODUODENOSCOPY N/A 09/24/2015   Procedure: ESOPHAGOGASTRODUODENOSCOPY (EGD);  Karen Good: Karen Craver, MD;  Location: [facility] ENDOSCOPY;  Service: Endoscopy;  Laterality: N/A;  . EUS N/A 10/05/2015   Procedure: LOWER ENDOSCOPIC ULTRASOUND (EUS);  Karen Good: Karen Banister, MD;  Location: [facility] ENDOSCOPY;  Service: Endoscopy;  Laterality: N/A;  . s/p breast biopsy  4/07   benign  . TUBAL LIGATION      SOCIAL HISTORY: Social History   Social History  . Marital status: Single    Spouse name: N/A  . Number of children: 2  . Years of education: N/A   Occupational History  .  [employer]   Social History Main Topics  . Smoking status: Never Smoker  . Smokeless tobacco: Never Used  . Alcohol use Yes     Comment: socially  . Drug use: No  . Sexual activity: Yes    Birth control/ protection: Post-menopausal   Other Topics Concern  . Not on file   Social History Narrative  . No narrative on file    FAMILY HISTORY: Family History  Problem Relation Age of Onset  . Diabetes Mother   . Heart disease Father   . Diabetes Father   . Hypertension Father   . Diabetes Sister   . Diabetes Other   . Diabetes Brother     ALLERGIES:  has No Known Allergies.  MEDICATIONS:  Current Outpatient Prescriptions  Medication Sig Dispense Refill  . amLODipine (NORVASC) 10 MG tablet Take 1 tablet (10 mg total) by mouth daily. 30 tablet 10  . atenolol (TENORMIN) 25 MG tablet Take 0.5 tablets (12.5 mg total) by mouth 2 (two) times daily. 60 tablet 2  . atorvastatin (LIPITOR) 20 MG tablet Take 1 tablet (20 mg total) by mouth daily. (Patient taking differently: Take 20 mg by mouth daily at 6 PM. ) 90 tablet 3  . clonazePAM (KLONOPIN) 0.5 MG tablet Take 1 tablet (0.5 mg total) by mouth 2 (two) times daily as needed (anxiety). 30 tablet 0  . GLIPIZIDE XL 5 MG 24 hr tablet TAKE TWO TABLETS BY MOUTH DAILY 180 tablet 2  . hydrochlorothiazide  (HYDRODIURIL) 25 MG tablet Take 1 tablet (25 mg total) by mouth daily. 30 tablet 5  . HYDROcodone-acetaminophen (NORCO) 5-325 MG tablet Take 1 tablet by mouth every 6 (six) hours as needed for moderate pain. To last 30 days 30 tablet 0  . ibuprofen (ADVIL,MOTRIN) 200 MG tablet Take 400 mg by mouth every 6 (six) hours as needed for mild pain.    Marland Kitchen irbesartan (AVAPRO) 300 MG tablet Take 1 tablet (300 mg total) by mouth at bedtime. 90 tablet 3  . KLOR-CON M20 20 MEQ tablet TAKE 1 TABLET EVERY DAY 60 tablet 0  . pioglitazone (ACTOS) 45 MG tablet TAKE 1 TABLET (45 MG TOTAL) BY MOUTH DAILY.  3  .  sitaGLIPtin-metformin (JANUMET) 50-1000 MG tablet Take 1 tablet by mouth 2 (two) times daily. 180 tablet 3  . capecitabine (XELODA) 500 MG tablet Take 4 tab in morning and 3 tab in evening, for 2 weeks on and one week off (Patient not taking: Reported on 03/28/2016) 98 tablet 1   No current facility-administered medications for this visit.     REVIEW OF SYSTEMS: Constitutional: Denies fevers, chills or abnormal night sweats Eyes: Denies blurriness of vision, double vision or watery eyes Ears, nose, mouth, throat, and face: Denies mucositis or sore throat Respiratory: Denies cough, dyspnea or wheezes Cardiovascular: Denies palpitation, chest discomfort or lower extremity swelling Gastrointestinal:  Denies nausea, heartburn or change in bowel habits Skin: Denies abnormal skin rashes Lymphatics: Denies new lymphadenopathy or easy bruising Neurological:Denies numbness, tingling or new weaknesses Behavioral/Psych: Mood is stable, no new changes  All other systems were reviewed with the patient and are negative.  PHYSICAL EXAMINATION: ECOG PERFORMANCE STATUS: 0 - Asymptomatic  Vitals:   03/28/16 1507 03/28/16 1528  BP: (!) 172/76 (!) 158/70  Pulse: (!) 107 (!) 105  Resp: 18   Temp: 97.9 F (36.6 C)    Filed Weights   03/28/16 1507  Weight: 161 lb 14.4 oz (73.4 kg)    GENERAL:alert, no distress  and comfortable SKIN: skin color, texture, turgor are normal, no rashes or significant lesions EYES: normal, conjunctiva are pink and non-injected, sclera clear OROPHARYNX:no exudate, no erythema and lips, buccal mucosa, and tongue normal  NECK: supple, thyroid normal size, non-tender, without nodularity LYMPH:  no palpable lymphadenopathy in the cervical, axillary or inguinal LUNGS: clear to auscultation and percussion with normal breathing effort HEART: regular rate & rhythm, 2/6 systolic murmurs, no lower extremity edema ABDOMEN:abdomen soft, non-tender and normal bowel sounds, no organomegaly. Surgical incisions have healed well. Rectal exam was not performed. Musculoskeletal:no cyanosis of digits and no clubbing  PSYCH: alert & oriented x 3 with fluent speech NEURO: no focal motor/sensory deficits Breast exam: deferred   LABORATORY DATA:  I have reviewed the data as listed CBC Latest Ref Rng & Units 03/28/2016 02/22/2016 02/03/2016  WBC 3.9 - 10.3 10e3/uL 4.2 5.8 3.4(L)  Hemoglobin 11.6 - 15.9 g/dL 12.0 10.8(L) 10.1(L)  Hematocrit 34.8 - 46.6 % 37.7 33.7(L) 31.2(L)  Platelets 145 - 400 10e3/uL 304 338 241   CMP Latest Ref Rng & Units 03/28/2016 03/20/2016 02/22/2016  Glucose 70 - 140 mg/dl 109 117(H) 81  BUN 7.0 - 26.0 mg/dL 14.7 14 12.5  Creatinine 0.6 - 1.1 mg/dL 0.7 0.52 0.7  Sodium 136 - 145 mEq/L 142 140 142  Potassium 3.5 - 5.1 mEq/L 3.4(L) 3.4(L) 3.2(L)  Chloride 96 - 112 mEq/L - 104 -  CO2 22 - 29 mEq/L '26 28 25  '$ Calcium 8.4 - 10.4 mg/dL 9.9 9.3 9.6  Total Protein 6.4 - 8.3 g/dL 7.6 6.8 7.2  Total Bilirubin 0.20 - 1.20 mg/dL 0.89 0.8 0.53  Alkaline Phos 40 - 150 U/L 81 58 89  AST 5 - 34 U/L '15 10 10  '$ ALT 0 - 55 U/L '17 9 9   '$ Results for HEAVIN, SEBREE (MRN [id number]) as of 03/28/2016 16:06  Ref. Range 09/24/2015 14:49 02/22/2016 15:10  CEA Latest Ref Range: 0.0 - 4.7 ng/mL 4.1   CEA ([facility]) Latest Ref Range: 0.00 - 5.00 ng/mL  1.05    PATHOLOGY REPORT    Diagnosis 09/24/2015 Colon, biopsy, Rectal sigmoid mass - ADENOCARCINOMA. - SEE COMMENT. Microscopic Comment Dr. Orene Desanctis has reviewed the  case and concurs with this interpretation. Dr. Loreta Ave was paged on 09/26/15. (JBK:gt, 09/26/15)  Diagnosis 01/31/2016 Colon, segmental resection for tumor, rectosigmoid - INVASIVE COLORECTAL ADENOCARCINOMA, 3.5 CM EXTENDING INTO PERIRECTAL CONNECTIVE TISSUE. - MARGINS NOT INVOLVED. - SEVENTEEN BENIGN LYMPH NODE (0/17). Microscopic Comment COLON AND RECTUM (INCLUDING TRANS-ANAL RESECTION): Specimen: Rectosigmoid colon. Procedure: Resection. Tumor site: Proximal rectum. Specimen integrity: Intact. Macroscopic intactness of mesorectum: Complete. Macroscopic tumor perforation: No. Invasive tumor: Maximum size: 3.5 cm. Histologic type(s): Colorectal adenocarcinoma. Histologic grade and differentiation: G2: moderately differentiated/low grade Type of polyp in which invasive carcinoma arose: No residual polyp. Microscopic extension of invasive tumor: Into perirectal connective tissue. Lymph-Vascular invasion: Not identified. Peri-neural invasion: No. Tumor deposit(s) (discontinuous extramural extension): No Resection margins: Proximal margin: Free of tumor. Distal margin: Free of tumor. Circumferential (radial) (posterior ascending, posterior descending; lateral and posterior mid-rectum; and entire lower 1/3 rectum): Free of tumor. Mesenteric margin (sigmoid and transverse): N/A 1 of 5 Amended copy Amended FINAL for Amendola, Alpha N [phone]) Microscopic Comment(continued) Distance closest margin (if all above margins negative): 3 cm from circumferential radial margin. Treatment effect (neo-adjuvant therapy): Yes. Additional polyp(s): Tubular adenoma with high grade dysplasia. Non-neoplastic findings: N/A Lymph nodes: number examined - 17; number positive: 0 Pathologic Staging: ypT3, ypN0, ypMX Ancillary studies: Microsatellite instability by  PCR and mismatch repair protein by immunohistochemistry. (JDP:gt, 02/01/16)   RADIOGRAPHIC STUDIES: I have personally reviewed the radiological images as listed and agreed with the findings in the report. No results found.   Diagnostic mammogram of left breast 12/12/2015 IMPRESSION: The asymmetry in the upper-outer quadrant of the left breast corresponds with the finding on the patient's CT. This has been stable well over 2 years (since 2011), consistent with a benign finding.  RECOMMENDATION: Screening mammogram in one year.(Code:SM-B-01Y)  I have discussed the findings and recommendations with the patient. Results were also provided in writing at the conclusion of the visit. If applicable, a reminder letter will be sent to the patient regarding the next appointment.  BI-RADS CATEGORY  2: Benign.  EUS 10/05/2015 Dr. Christella Hartigan  IMPRESSION:  Partially circumferential, 6cm long uT3N0 rectosigmoid adenocarcinoma with distal edge located 9cm from the anal verge. Submucosal injections of [place] Ink, at proximal and distal edges of the mass.  Colonoscopy and EGD 09/24/2015 Dr. Loreta Ave  - Large circumferental polypoid mass in the rectosigmoid colon from 10-20 cm-biopsies Done. - Normal appearing, widely patent esophagus and GEJ. - Small hiatal hernia noted on retroflexion. - Mild antral gastritis-otherwise normal appearing stomach. - Normal examined duodenum. - No specimens collected.  ASSESSMENT & PLAN: 60 y.o. female with past medical history of diabetes and hypertension, presented with rectal bleeding. Colonoscopy showed a large proximal rectal mass.  1. Rectal adenocarcinoma, proximal rectum, uT3N0M0, stage IIA, ypT3N0M0  -I have previously reviewed her colonoscopy, EUS, and CT of abdomen pelvis finding with pt in details -The EUS showed a T3 lesion, no suspicious lymphadenopathy. The CT scan showed no evidence of adenopathy or distant metastasis. She has stage IIA disease -We  previously reviewed the natural history of rectal cancer and risk of recurrence after surgery. Given the stage II a disease, she likely has moderate risk of recurrence. We discussed the treatment option for stage IIA rectal cancer. The standard of care is neoadjuvant chemotherapy and radiation, followed by surgery, then adjuvant chemotherapy. -She completed neoadjuvant chemoradiation, tolerated it very well -I previously discussed her surgical pathology findings with patient and her husband in details, she had minimal response to neoadjuvant chemotherapy  and radiation, still has residual T3 disease, no lymph node metastasis. -We previously discussed the role of adjuvant chemotherapy in rectal cancer, given the significant residual disease, I recommend adjuvant Xeloda '1000mg'$ /m2 bid for 14 days, then 7 days off, for 5 cycles.  -she agreed with the plan, and started on 11/27. -We discussed the patient's labs and they are satisfactory. She may begin cycle 2 of Xeloda next Monday  2. Iron deficient anemia secondary to GI bleeding -Her iron studies on 09/22/15 and 10/11/15 were consistent with iron deficient anemia. -She has received blood transfusion and iv feraheme twice  -continue oral ferrous sulfate 1 tablet a day  3. Anxiety  -Secondary to cancer diagnosis. -Improved lately.  4. HTN and DM -Continue medication. She'll follow-up with her primary care physician -I discussed the impact of chemotherapy on her blood pressure and diabetes, we'll monitor her blood pressure and glucose level closely, and is just the medication if needed. -Her blood pressure has been high lately, I encouraged her to monitor at home and follow-up with her primary care physician  5. Left breast mass -CT chest revealed a left breast mass, however her screening mammogram was negative in 09/2015 -Her diagnostic mammogram showed the left breast lesion has been stable for a few years, likely benign, no biopsy was  recommended.  6. Hypokalemia  -I advised the patient to take a potassium supplement since it is low. K 3.4 today   PLAN -She will start  2nd cycle of adjvant Xeloda '2000mg'$  in am, '1500mg'$  in evening, 2 weeks on, 1 week off, on 04/01/16. -I plan to see her back the week before her third cycle of Xeloda. Therefore, I will see her back in 3 weeks. -I wrote a work note for the patient regarding the patient's health status and OK to return to work on 05/06/2016.  All questions were answered. The patient knows to call the clinic with any problems, questions or concerns.  I spent 20 minutes counseling the patient face to face. The total time spent in the appointment was 25 minutes and more than 50% was on counseling.     Karen Merle, MD 03/28/2016   This document serves as a record of services personally performed by Karen Merle, MD. It was created on her behalf by Darcus Austin, a trained medical scribe. The creation of this record is based on the scribe's personal observations and the provider's statements to them. This document has been checked and approved by the attending provider.

## 2016-03-28 ENCOUNTER — Encounter: Payer: Self-pay | Admitting: Hematology

## 2016-03-28 ENCOUNTER — Ambulatory Visit (HOSPITAL_BASED_OUTPATIENT_CLINIC_OR_DEPARTMENT_OTHER): Payer: 59 | Admitting: Hematology

## 2016-03-28 ENCOUNTER — Other Ambulatory Visit (HOSPITAL_BASED_OUTPATIENT_CLINIC_OR_DEPARTMENT_OTHER): Payer: 59

## 2016-03-28 VITALS — BP 158/70 | HR 105 | Temp 97.9°F | Resp 18 | Ht 65.0 in | Wt 161.9 lb

## 2016-03-28 DIAGNOSIS — D500 Iron deficiency anemia secondary to blood loss (chronic): Secondary | ICD-10-CM

## 2016-03-28 DIAGNOSIS — E876 Hypokalemia: Secondary | ICD-10-CM

## 2016-03-28 DIAGNOSIS — C20 Malignant neoplasm of rectum: Secondary | ICD-10-CM | POA: Diagnosis not present

## 2016-03-28 DIAGNOSIS — N63 Unspecified lump in unspecified breast: Secondary | ICD-10-CM | POA: Diagnosis not present

## 2016-03-28 DIAGNOSIS — I10 Essential (primary) hypertension: Secondary | ICD-10-CM

## 2016-03-28 DIAGNOSIS — E119 Type 2 diabetes mellitus without complications: Secondary | ICD-10-CM | POA: Diagnosis not present

## 2016-03-28 DIAGNOSIS — D509 Iron deficiency anemia, unspecified: Secondary | ICD-10-CM

## 2016-03-28 DIAGNOSIS — F419 Anxiety disorder, unspecified: Secondary | ICD-10-CM

## 2016-03-28 LAB — CBC WITH DIFFERENTIAL/PLATELET
BASO%: 0.3 % (ref 0.0–2.0)
Basophils Absolute: 0 10*3/uL (ref 0.0–0.1)
EOS ABS: 0 10*3/uL (ref 0.0–0.5)
EOS%: 0.6 % (ref 0.0–7.0)
HCT: 37.7 % (ref 34.8–46.6)
HGB: 12 g/dL (ref 11.6–15.9)
LYMPH%: 9.5 % — ABNORMAL LOW (ref 14.0–49.7)
MCH: 28 pg (ref 25.1–34.0)
MCHC: 31.8 g/dL (ref 31.5–36.0)
MCV: 88.1 fL (ref 79.5–101.0)
MONO#: 0.3 10*3/uL (ref 0.1–0.9)
MONO%: 6.5 % (ref 0.0–14.0)
NEUT%: 83.1 % — ABNORMAL HIGH (ref 38.4–76.8)
NEUTROS ABS: 3.5 10*3/uL (ref 1.5–6.5)
PLATELETS: 304 10*3/uL (ref 145–400)
RBC: 4.28 10*6/uL (ref 3.70–5.45)
RDW: 13.8 % (ref 11.2–14.5)
WBC: 4.2 10*3/uL (ref 3.9–10.3)
lymph#: 0.4 10*3/uL — ABNORMAL LOW (ref 0.9–3.3)

## 2016-03-28 LAB — COMPREHENSIVE METABOLIC PANEL
ALT: 17 U/L (ref 0–55)
ANION GAP: 11 meq/L (ref 3–11)
AST: 15 U/L (ref 5–34)
Albumin: 4.1 g/dL (ref 3.5–5.0)
Alkaline Phosphatase: 81 U/L (ref 40–150)
BUN: 14.7 mg/dL (ref 7.0–26.0)
CO2: 26 meq/L (ref 22–29)
CREATININE: 0.7 mg/dL (ref 0.6–1.1)
Calcium: 9.9 mg/dL (ref 8.4–10.4)
Chloride: 105 mEq/L (ref 98–109)
EGFR: 88 mL/min/{1.73_m2} — AB (ref >90)
GLUCOSE: 109 mg/dL (ref 70–140)
Potassium: 3.4 mEq/L — ABNORMAL LOW (ref 3.5–5.1)
SODIUM: 142 meq/L (ref 136–145)
TOTAL PROTEIN: 7.6 g/dL (ref 6.4–8.3)
Total Bilirubin: 0.89 mg/dL (ref 0.20–1.20)

## 2016-03-29 ENCOUNTER — Other Ambulatory Visit: Payer: Self-pay | Admitting: *Deleted

## 2016-03-29 DIAGNOSIS — C20 Malignant neoplasm of rectum: Secondary | ICD-10-CM

## 2016-03-29 LAB — FERRITIN: Ferritin: 88 ng/ml (ref 9–269)

## 2016-03-29 LAB — IRON AND TIBC
%SAT: 14 % — ABNORMAL LOW (ref 21–57)
Iron: 59 ug/dL (ref 41–142)
TIBC: 415 ug/dL (ref 236–444)
UIBC: 356 ug/dL (ref 120–384)

## 2016-03-29 MED ORDER — CAPECITABINE 500 MG PO TABS: ORAL_TABLET | ORAL | 3 refills | Status: DC

## 2016-04-01 ENCOUNTER — Encounter: Payer: Self-pay | Admitting: Hematology

## 2016-04-01 NOTE — Progress Notes (Signed)
Faxed disability papers to [facility] and advised patient's spouse.

## 2016-04-01 NOTE — Progress Notes (Signed)
Mailed [employer] disability papers to Mrs. Divirgilio.

## 2016-04-05 ENCOUNTER — Encounter: Payer: Self-pay | Admitting: Hematology

## 2016-04-05 NOTE — Progress Notes (Signed)
Per patient and patient's spouse faxed medical notes to [facility] claim# [id number].

## 2016-04-16 ENCOUNTER — Telehealth: Payer: Self-pay | Admitting: *Deleted

## 2016-04-16 NOTE — Telephone Encounter (Signed)
Oncology Nurse Navigator Documentation  Oncology Nurse Navigator Flowsheets 04/16/2016  Navigator Location [facility]  Referral date to RadOnc/MedOnc -  Navigator Encounter Type Telephone  Telephone Incoming Call;Medication Assistance  Abnormal Finding Date -  Confirmed Diagnosis Date -  Treatment Initiated Date -  Patient Visit Type -  Treatment Phase Active Tx--Xeloda (next fill will be due 05/09/16)  Barriers/Navigation Needs Coordination of Care  Interventions Referrals  Referrals Other--in basket to oral chemo pharmacist to f/u  Acuity Level 1  Time Spent with Patient 15  Patient called to report that [facility] has filled her Xeloda for the last time. Was informed that they are no longer her pharmacy for specialty drugs. Needs next script sent to new pharmacy, but she does not know which to use. Tells RN she still has her [place].

## 2016-04-17 ENCOUNTER — Telehealth: Payer: Self-pay | Admitting: *Deleted

## 2016-04-17 MED ORDER — HYDROCODONE-ACETAMINOPHEN 5-325 MG PO TABS: 1.0000 | ORAL_TABLET | Freq: Four times a day (QID) | ORAL | 0 refills | Status: DC | PRN

## 2016-04-17 NOTE — Telephone Encounter (Signed)
Notified pt rx ready for pick-up.../lmb 

## 2016-04-17 NOTE — Telephone Encounter (Signed)
Pt left msg on triage requesting refill on Hydrocodone.../lmb 

## 2016-04-17 NOTE — Telephone Encounter (Signed)
Done hardcopy to Corinne  

## 2016-04-18 NOTE — Progress Notes (Signed)
[place]  [phone] Fax:([phone]) [phone]  Clinic Follow up Note   Patient Care Team: Biagio Borg, MD as PCP - General Kyung Rudd, MD as Consulting Physician (Radiation Oncology) Leighton Ruff, MD as Consulting Physician (General Surgery) Milus Banister, MD as Attending Physician (Gastroenterology) Tania Ade, RN as Registered Nurse 04/22/2016   CHIEF COMPLAINTS:  Follow up rectal cancer  Oncology History   Rectal cancer [facility])   Staging form: Colon and Rectum, AJCC 7th Edition   - Clinical: Stage IIA (T3, N0, M0) - Signed by [name] Merle, MD on 10/10/2015   - Pathologic stage from 01/31/2016: Stage IIA (T3, N0, cM0) - Signed by [name] Merle, MD on 02/22/2016       Rectal cancer ([place])   09/22/2015 Imaging    CT ABD/PELVIS: Soft tissue fullness at the rectosigmoid junction. Otherwise, no acute process in the abdomen or pelvis      09/24/2015 Pathology Results    Adenocarcinoma      09/24/2015 Initial Diagnosis    Rectal cancer ([place])      09/24/2015 Procedure    COLONOSCOPY: Nonobsructive mass in rectosigmoid colon from 10-20 cum, circumferential (Dr. Collene Mares)      09/24/2015 Tumor Marker    CEA=4.1      09/25/2015 Imaging    CT CHEST: Mass within the upper-outer quadrant of the left breast warranting further evaluation. No suspicious pulmonary abnormality      10/25/2015 - 12/01/2015 Radiation Therapy    adjuvant irradiation to rectal cancer      10/25/2015 - 12/01/2015 Chemotherapy    Xeloda 1500 mg twice daily, with concurrent irradiation      01/31/2016 Surgery    Robot assisted low anterior resection of rectal cancer       01/31/2016 Pathology Results    Invasive colorectal adenocarcinoma, 3.5cm, G2, LVI(-), peri-neural invasion (-), ypT3, margins negative, 17 nodes all negative      ' HISTORY OF PRESENTING ILLNESS:  Karen Good 61 y.o. female is here because of her newly diagnosed rectal cancer. She presents to my clinic with  her husband.  She had had intermittent bloody stool for one month, somewhat mount, mixed with stool, with worsening fatigue, and 6 lbs weight loss, no significant abdominal pain, nausea, dyspnea, or other symptoms. She was seen by her PCP Dr. Jenny Reichmann and lab work showed anemia. She was sent to ED on 6/9 and received blood transfusion for Hb 6.9. She underwent a colonoscopy which showed a partially obstructive proximal rectal mass, biopsy showed adenocarcinoma. She was subsequently referred to Dr. Ardis Hughs and underwent EUS which showed a T3 N0 rectal mass. She was seen by [facility] Dr. Marcello Moores last week, and is also scheduled to see radiation oncologist Dr. Lisbeth Renshaw tomorrow.  She has felt much better overall after blood transfusion. She did receive IV ferric gluconate 125 mg in the hospital, but is not on oral iron supplements. She feels well overall, has good appetite and energy level, but does feel anxious since her cancer diagnosis.  CURRENT THERAPY: Adjvant Xeloda 2085m in am, 15055min evening, 2 weeks on, 1 week off; started on 03/11/2016  INTERIM HISTORY: BrSonal[place]urns for follow-up. The patient reports she is doing well after her second cycle of Xeloda. She has started her third cycle this morning. She reports dry skin of her hands. Denies nausea. Reports a good appetite. Denies fever. Reports good bowel movements. She plans to go back to work on May 06, 2016.  MEDICAL HISTORY:  Past Medical History:  Diagnosis Date  . Anemia   . BACK PAIN 04/13/2010  . BACK STRAIN, LUMBAR 04/13/2010  . Cancer ([facility])    rectal  . DIABETES MELLITUS, TYPE II 06/02/2008  . History of blood transfusion   . History of chemotherapy   . History of radiation therapy   . HYPERLIPIDEMIA 06/02/2008  . HYPERTENSION 06/02/2008    SURGICAL HISTORY: Past Surgical History:  Procedure Laterality Date  . COLONOSCOPY Left 09/24/2015   Procedure: COLONOSCOPY;  [place]: Juanita Craver, MD;  Location[facility] END[facility]COPY;  Service:  Endoscopy;  Laterality: Left;  . ESOPHAGOGASTRODUODENOSCOPY N/A 09/24/2015   Procedure: ESOPHAGOGASTRODUODENOSCOPY (EGD);  [place]: Juanita Craver, MD;  Location: [facility] ENDOSCOPY;  Service: Endoscopy;  Laterality: N/A;  . EUS N/A 10/05/2015   Procedure: LOWER ENDOSCOPIC ULTRASOUND (EUS);  [place]: Milus Banister, MD;  Location: [facility] ENDOSCOPY;  Service: Endoscopy;  Laterality: N/A;  . s/p breast biopsy  4/07   benign  . TUBAL LIGATION      SOCIAL HISTORY: Social History   Social History  . Marital status: Single    Spouse name: N/A  . Number of children: 2  . Years of education: N/A   Occupational History  .  [employer]   Social History Main Topics  . Smoking status: Never Smoker  . Smokeless tobacco: Never Used  . Alcohol use Yes     Comment: socially  . Drug use: No  . Sexual activity: Yes    Birth control/ protection: Post-menopausal   Other Topics Concern  . Not on file   Social History Narrative  . No narrative on file    FAMILY HISTORY: Family History  Problem Relation Age of Onset  . Diabetes Mother   . Heart disease Father   . Diabetes Father   . Hypertension Father   . Diabetes Sister   . Diabetes Other   . Diabetes Brother     ALLERGIES:  has No Known Allergies.  MEDICATIONS:  Current Outpatient Prescriptions  Medication Sig Dispense Refill  . amLODipine (NORVASC) 10 MG tablet Take 1 tablet (10 mg total) by mouth daily. 30 tablet 10  . atenolol (TENORMIN) 25 MG tablet Take 0.5 tablets (12.5 mg total) by mouth 2 (two) times daily. 60 tablet 2  . atorvastatin (LIPITOR) 20 MG tablet Take 1 tablet (20 mg total) by mouth daily. (Patient taking differently: Take 20 mg by mouth daily at 6 PM. ) 90 tablet 3  . capecitabine (XELODA) 500 MG tablet Take 4 tab in morning and 3 tab in evening, for 2 weeks on and one week off 98 tablet 3  . clonazePAM (KLONOPIN) 0.5 MG tablet Take 1 tablet (0.5 mg total) by mouth 2 (two) times daily as needed (anxiety). 30 tablet 0    . GLIPIZIDE XL 5 MG 24 hr tablet TAKE TWO TABLETS BY MOUTH DAILY 180 tablet 2  . hydrochlorothiazide (HYDRODIURIL) 25 MG tablet Take 1 tablet (25 mg total) by mouth daily. 30 tablet 5  . HYDROcodone-acetaminophen (NORCO) 5-325 MG tablet Take 1 tablet by mouth every 6 (six) hours as needed for moderate pain. To last 30 days 30 tablet 0  . ibuprofen (ADVIL,MOTRIN) 200 MG tablet Take 400 mg by mouth every 6 (six) hours as needed for mild pain.    Marland Kitchen irbesartan (AVAPRO) 300 MG tablet Take 1 tablet (300 mg total) by mouth at bedtime. 90 tablet 3  . KLOR-CON M20 20 MEQ tablet TAKE 1 TABLET EVERY  DAY 60 tablet 0  . pioglitazone (ACTOS) 45 MG tablet TAKE 1 TABLET (45 MG TOTAL) BY MOUTH DAILY.  3  . sitaGLIPtin-metformin (JANUMET) 50-1000 MG tablet Take 1 tablet by mouth 2 (two) times daily. 180 tablet 3   No current facility-administered medications for this visit.     REVIEW OF SYSTEMS: Constitutional: Denies fevers, chills or abnormal night sweats Eyes: Denies blurriness of vision, double vision or watery eyes Ears, nose, mouth, throat, and face: Denies mucositis or sore throat Respiratory: Denies cough, dyspnea or wheezes Cardiovascular: Denies palpitation, chest discomfort or lower extremity swelling Gastrointestinal:  Denies nausea, heartburn or change in bowel habits Skin: Denies abnormal skin rashes (+) Dry skin Lymphatics: Denies new lymphadenopathy or easy bruising Neurological:Denies numbness, tingling or new weaknesses Behavioral/Psych: Mood is stable, no new changes  All other systems were reviewed with the patient and are negative.  PHYSICAL EXAMINATION: ECOG PERFORMANCE STATUS: 0 - Asymptomatic  Vitals:   04/22/16 1501  BP: (!) 159/64  Pulse: 99  Resp: 18  Temp: 97.9 F (36.6 C)   Filed Weights   04/22/16 1501  Weight: 168 lb 6.4 oz (76.4 kg)    GENERAL:alert, no distress and comfortable SKIN: skin color, texture, turgor are normal, no rashes or significant  lesions EYES: normal, conjunctiva are pink and non-injected, sclera clear OROPHARYNX:no exudate, no erythema and lips, buccal mucosa, and tongue normal  NECK: supple, thyroid normal size, non-tender, without nodularity LYMPH:  no palpable lymphadenopathy in the cervical, axillary or inguinal LUNGS: clear to auscultation and percussion with normal breathing effort HEART: regular rate & rhythm, 2/6 systolic murmurs, no lower extremity edema ABDOMEN:abdomen soft, non-tender and normal bowel sounds, no organomegaly. Surgical incisions have healed well. Rectal exam was not performed. Musculoskeletal:no cyanosis of digits and no clubbing  PSYCH: alert & oriented x 3 with fluent speech NEURO: no focal motor/sensory deficits Breast exam: deferred   LABORATORY DATA:  I have reviewed the data as listed CBC Latest Ref Rng & Units 04/22/2016 03/28/2016 02/22/2016  WBC 3.9 - 10.3 10e3/uL 3.0(L) 4.2 5.8  Hemoglobin 11.6 - 15.9 g/dL 11.5(L) 12.0 10.8(L)  Hematocrit 34.8 - 46.6 % 34.8 37.7 33.7(L)  Platelets 145 - 400 10e3/uL 279 304 338   CMP Latest Ref Rng & Units 04/22/2016 03/28/2016 03/20/2016  Glucose 70 - 140 mg/dl 107 109 117(H)  BUN 7.0 - 26.0 mg/dL 14.1 14.7 14  Creatinine 0.6 - 1.1 mg/dL 0.7 0.7 0.52  Sodium 136 - 145 mEq/L 140 142 140  Potassium 3.5 - 5.1 mEq/L 3.4(L) 3.4(L) 3.4(L)  Chloride 96 - 112 mEq/L - - 104  CO2 22 - 29 mEq/L _0 Calcium 8.4 - 10.4 mg/dL 10.0 9.9 9.3  Total Protein 6.4 - 8.3 g/dL 7.3 7.6 6.8  Total Bilirubin 0.20 - 1.20 mg/dL 1.58(H) 0.89 0.8  Alkaline Phos 40 - 150 U/L 77 81 58  AST 5 - 34 U/L _1 ALT 0 - 55 U/L _2 Results for TYNISHA, OGAN (MRN [id number]) as of 03/28/2016 16:06  Ref. Range 09/24/2015 14:49 02/22/2016 15:10  CEA Latest Ref Range: 0.0 - 4.7 ng/mL 4.1   CEA ([facility]) Latest Ref Range: 0.00 - 5.00 ng/mL  1.05    PATHOLOGY REPORT  Diagnosis 09/24/2015 Colon, biopsy, Rectal sigmoid mass - ADENOCARCINOMA. - SEE  COMMENT. Microscopic Comment Dr. Orene Desanctis has reviewed the case and concurs with this interpretation. Dr. Collene Mares was paged on 09/26/15. (JBK:gt, 09/26/15)  Diagnosis 01/31/2016  Colon, segmental resection for tumor, rectosigmoid - INVASIVE COLORECTAL ADENOCARCINOMA, 3.5 CM EXTENDING INTO PERIRECTAL CONNECTIVE TISSUE. - MARGINS NOT INVOLVED. - SEVENTEEN BENIGN LYMPH NODE (0/17). Microscopic Comment COLON AND RECTUM (INCLUDING TRANS-ANAL RESECTION): Specimen: Rectosigmoid colon. Procedure: Resection. Tumor site: Proximal rectum. Specimen integrity: Intact. Macroscopic intactness of mesorectum: Complete. Macroscopic tumor perforation: No. Invasive tumor: Maximum size: 3.5 cm. Histologic type(s): Colorectal adenocarcinoma. Histologic grade and differentiation: G2: moderately differentiated/low grade Type of polyp in which invasive carcinoma arose: No residual polyp. Microscopic extension of invasive tumor: Into perirectal connective tissue. Lymph-Vascular invasion: Not identified. Peri-neural invasion: No. Tumor deposit(s) (discontinuous extramural extension): No Resection margins: Proximal margin: Free of tumor. Distal margin: Free of tumor. Circumferential (radial) (posterior ascending, posterior descending; lateral and posterior mid-rectum; and entire lower 1/3 rectum): Free of tumor. Mesenteric margin (sigmoid and transverse): N/A 1 of 5 Amended copy Amended FINAL for Maddix, Dajuana N [phone]) Microscopic Comment(continued) Distance closest margin (if all above margins negative): 3 cm from circumferential radial margin. Treatment effect (neo-adjuvant therapy): Yes. Additional polyp(s): Tubular adenoma with high grade dysplasia. Non-neoplastic findings: N/A Lymph nodes: number examined - 17; number positive: 0 Pathologic Staging: ypT3, ypN0, ypMX Ancillary studies: Microsatellite instability by PCR and mismatch repair protein by immunohistochemistry.  (JDP:gt, 02/01/16)   RADIOGRAPHIC STUDIES: I have personally reviewed the radiological images as listed and agreed with the findings in the report. No results found.   Diagnostic mammogram of left breast 12/12/2015 IMPRESSION: The asymmetry in the upper-outer quadrant of the left breast corresponds with the finding on the patient's CT. This has been stable well over 2 years (since 2011), consistent with a benign finding.  RECOMMENDATION: Screening mammogram in one year.(Code:SM-B-01Y)  I have discussed the findings and recommendations with the patient. Results were also provided in writing at the conclusion of the visit. If applicable, a reminder letter will be sent to the patient regarding the next appointment.  BI-RADS CATEGORY  2: Benign.  EUS 10/05/2015 Dr. Ardis Hughs  IMPRESSION:  Partially circumferential, 6cm long uT3N0 rectosigmoid adenocarcinoma with distal edge located 9cm from the anal verge. Submucosal injections of [place] Ink, at proximal and distal edges of the mass.  Colonoscopy and EGD 09/24/2015 Dr. Collene Mares  - Large circumferental polypoid mass in the rectosigmoid colon from 10-20 cm-biopsies Done. - Normal appearing, widely patent esophagus and GEJ. - Small hiatal hernia noted on retroflexion. - Mild antral gastritis-otherwise normal appearing stomach. - Normal examined duodenum. - No specimens collected.  ASSESSMENT & PLAN: 61 y.o. female with past medical history of diabetes and hypertension, presented with rectal bleeding. Colonoscopy showed a large proximal rectal mass.  1. Rectal adenocarcinoma, proximal rectum, uT3N0M0, stage IIA, ypT3N0M0  -I have previously reviewed her colonoscopy, EUS, and CT of abdomen pelvis finding with pt in details -The EUS showed a T3 lesion, no suspicious lymphadenopathy. The CT scan showed no evidence of adenopathy or distant metastasis. She has stage IIA disease -We previously reviewed the natural history of rectal cancer and  risk of recurrence after surgery. Given the stage II a disease, she likely has moderate risk of recurrence. We discussed the treatment option for stage IIA rectal cancer. The standard of care is neoadjuvant chemotherapy and radiation, followed by surgery, then adjuvant chemotherapy. -She completed neoadjuvant chemoradiation, tolerated it very well -I previously discussed her surgical pathology findings with patient and her husband in details, she had minimal response to neoadjuvant chemotherapy and radiation, still has residual T3 disease, no lymph node metastasis. -We previously discussed the role of  adjuvant chemotherapy in rectal cancer, given the significant residual disease, I recommended adjuvant Xeloda 2048m/m2 in the morning and 15035mm2 in the evening for 14 days, then 7 days off, for 5 cycles. -she agreed with the plan, and started on 11/27. -She has been tolerating Xeloda very well. -Lab reviewed, CBC and CMP are within normal limits, except mild elevated total bilirubin, which could be related to Xeloda. She has started cycle 3 yesterday, will continue   2. Iron deficient anemia secondary to GI bleeding -Her iron studies on 09/22/15 and 10/11/15 were consistent with iron deficient anemia. -She has received blood transfusion and iv feraheme twice  -continue oral ferrous sulfate 1 tablet a day  3. Anxiety  -Secondary to cancer diagnosis. -Improved lately.  4. HTN and DM -Continue medication. She'll follow-up with her primary care physician -I discussed the impact of chemotherapy on her blood pressure and diabetes, we'll monitor her blood pressure and glucose level closely, and is just the medication if needed. -Her blood pressure has been high lately, I encouraged her to monitor at home and follow-up with her primary care physician  5. Left breast mass -CT chest revealed a left breast mass, however her screening mammogram was negative in 09/2015 -Her diagnostic mammogram showed the  left breast lesion has been stable for a few years, likely benign, no biopsy was recommended.  6. Hypokalemia  -I advised the patient to take a potassium supplement since it is low. -K 3.4 TODAY  7. Hyperbilirubinemia -Total Bilirubin is elevated at 1.58 TODAY, new from before, possible related to Xeloda. -I advised the patient to watch the color of her urine (if it turns dark yellow/tea colored) and if the whites of her eyes turn yellow. She is to let usKoreanow immediately if she develops jaundice.  PLAN -She has started 3rd cycle of adjvant Xeloda 200050mn am, 1500m9m evening, 2 weeks on, 1 week off, on 04/22/2016. -Lab and f/u on 05/10/16.  All questions were answered. The patient knows to call the clinic with any problems, questions or concerns.  I spent 20 minutes counseling the patient face to face. The total time spent in the appointment was 25 minutes and more than 50% was on counseling.     FengTruitt Merle 04/22/2016   This document serves as a record of services personally performed by Lavone Barrientes [name] Merle. It was created on her behalf by JalaDarcus Austintrained medical scribe. The creation of this record is based on the scribe's personal observations and the provider's statements to them. This document has been checked and approved by the attending provider.

## 2016-04-22 ENCOUNTER — Telehealth: Payer: Self-pay | Admitting: Hematology

## 2016-04-22 ENCOUNTER — Ambulatory Visit (HOSPITAL_BASED_OUTPATIENT_CLINIC_OR_DEPARTMENT_OTHER): Payer: 59 | Admitting: Hematology

## 2016-04-22 ENCOUNTER — Other Ambulatory Visit (HOSPITAL_BASED_OUTPATIENT_CLINIC_OR_DEPARTMENT_OTHER): Payer: 59

## 2016-04-22 ENCOUNTER — Encounter: Payer: Self-pay | Admitting: Hematology

## 2016-04-22 VITALS — BP 159/64 | HR 99 | Temp 97.9°F | Resp 18 | Wt 168.4 lb

## 2016-04-22 DIAGNOSIS — E876 Hypokalemia: Secondary | ICD-10-CM

## 2016-04-22 DIAGNOSIS — D500 Iron deficiency anemia secondary to blood loss (chronic): Secondary | ICD-10-CM | POA: Diagnosis not present

## 2016-04-22 DIAGNOSIS — C20 Malignant neoplasm of rectum: Secondary | ICD-10-CM

## 2016-04-22 DIAGNOSIS — I10 Essential (primary) hypertension: Secondary | ICD-10-CM

## 2016-04-22 DIAGNOSIS — E119 Type 2 diabetes mellitus without complications: Secondary | ICD-10-CM | POA: Diagnosis not present

## 2016-04-22 DIAGNOSIS — N649 Disorder of breast, unspecified: Secondary | ICD-10-CM

## 2016-04-22 DIAGNOSIS — D509 Iron deficiency anemia, unspecified: Secondary | ICD-10-CM

## 2016-04-22 DIAGNOSIS — F419 Anxiety disorder, unspecified: Secondary | ICD-10-CM

## 2016-04-22 LAB — CBC WITH DIFFERENTIAL/PLATELET
BASO%: 0.2 % (ref 0.0–2.0)
BASOS ABS: 0 10*3/uL (ref 0.0–0.1)
EOS%: 0.5 % (ref 0.0–7.0)
Eosinophils Absolute: 0 10*3/uL (ref 0.0–0.5)
HCT: 34.8 % (ref 34.8–46.6)
HGB: 11.5 g/dL — ABNORMAL LOW (ref 11.6–15.9)
LYMPH%: 14.2 % (ref 14.0–49.7)
MCH: 29 pg (ref 25.1–34.0)
MCHC: 33.2 g/dL (ref 31.5–36.0)
MCV: 87.4 fL (ref 79.5–101.0)
MONO#: 0.3 10*3/uL (ref 0.1–0.9)
MONO%: 9.1 % (ref 0.0–14.0)
NEUT#: 2.3 10*3/uL (ref 1.5–6.5)
NEUT%: 76 % (ref 38.4–76.8)
Platelets: 279 10*3/uL (ref 145–400)
RBC: 3.98 10*6/uL (ref 3.70–5.45)
RDW: 17.7 % — AB (ref 11.2–14.5)
WBC: 3 10*3/uL — ABNORMAL LOW (ref 3.9–10.3)
lymph#: 0.4 10*3/uL — ABNORMAL LOW (ref 0.9–3.3)

## 2016-04-22 LAB — COMPREHENSIVE METABOLIC PANEL
ALT: 18 U/L (ref 0–55)
AST: 15 U/L (ref 5–34)
Albumin: 4.1 g/dL (ref 3.5–5.0)
Alkaline Phosphatase: 77 U/L (ref 40–150)
Anion Gap: 10 mEq/L (ref 3–11)
BUN: 14.1 mg/dL (ref 7.0–26.0)
CHLORIDE: 104 meq/L (ref 98–109)
CO2: 26 meq/L (ref 22–29)
Calcium: 10 mg/dL (ref 8.4–10.4)
Creatinine: 0.7 mg/dL (ref 0.6–1.1)
EGFR: >90 mL/min/{1.73_m2} (ref >90)
GLUCOSE: 107 mg/dL (ref 70–140)
POTASSIUM: 3.4 meq/L — AB (ref 3.5–5.1)
SODIUM: 140 meq/L (ref 136–145)
Total Bilirubin: 1.58 mg/dL — ABNORMAL HIGH (ref 0.20–1.20)
Total Protein: 7.3 g/dL (ref 6.4–8.3)

## 2016-04-22 NOTE — Telephone Encounter (Signed)
GAVE PATIENT AVS REPORT AND APPOINTMENTS FOR January  °

## 2016-04-23 ENCOUNTER — Other Ambulatory Visit: Payer: Self-pay | Admitting: Internal Medicine

## 2016-04-23 LAB — FERRITIN: Ferritin: 85 ng/ml (ref 9–269)

## 2016-05-07 ENCOUNTER — Telehealth: Payer: Self-pay | Admitting: *Deleted

## 2016-05-07 DIAGNOSIS — C20 Malignant neoplasm of rectum: Secondary | ICD-10-CM

## 2016-05-07 MED ORDER — CAPECITABINE 500 MG PO TABS: ORAL_TABLET | ORAL | 3 refills | Status: DC

## 2016-05-07 NOTE — Telephone Encounter (Signed)
Oncology Nurse Navigator Documentation  Oncology Nurse Navigator Flowsheets 05/07/2016  Navigator Location [facility]-  Referral date to RadOnc/MedOnc -  Navigator Encounter Type Telephone  Telephone Incoming Call;Outgoing Call;Medication Assistance  Abnormal Finding Date -  Confirmed Diagnosis Date -  Treatment Initiated Date -  Patient Visit Type -  Treatment Phase Active Tx--Xeloda  Barriers/Navigation Needs Coordination of Care--needs refill and [facility] is no longer filling her her script in 2018.  Interventions Coordination of Care--sent refill to [facility] at instruction of Denyse Amass, [place] and made her aware to follow up.  Referrals -  Coordination of Care Chemo--notified patient per voice mail.  Acuity Level 2  Time Spent with Patient 15

## 2016-05-09 ENCOUNTER — Telehealth: Payer: Self-pay | Admitting: Pharmacist

## 2016-05-09 ENCOUNTER — Other Ambulatory Visit: Payer: Self-pay | Admitting: *Deleted

## 2016-05-09 ENCOUNTER — Telehealth: Payer: Self-pay | Admitting: *Deleted

## 2016-05-09 DIAGNOSIS — C20 Malignant neoplasm of rectum: Secondary | ICD-10-CM

## 2016-05-09 NOTE — Telephone Encounter (Signed)
VM from Deidre Ala at [place] reporting her insurance requires her to fill her Xeloda at [place]. Provided #[phone] to call in the script. Patient had told navigator last week that she is required to have script filled at [place] this year. Forwarded message to oral chemotherapy pharmacist.

## 2016-05-09 NOTE — Progress Notes (Signed)
[place]  [phone] [phone]  Clinic Follow up Note   Patient Care Team: Biagio Borg, MD as PCP - General Kyung Rudd, MD as Consulting Physician (Radiation Oncology) Leighton Ruff, MD as Consulting Physician (General Surgery) Milus Banister, MD as Attending Physician (Gastroenterology) Tania Ade, RN as Registered Nurse 05/10/2016   CHIEF COMPLAINTS:  Follow up rectal cancer  Oncology History   Rectal cancer [facility])   Staging form: Colon and Rectum, AJCC 7th Edition   - Clinical: Stage IIA (T3, N0, M0) - Signed by [name] Merle, MD on 10/10/2015   - Pathologic stage from 01/31/2016: Stage IIA (T3, N0, cM0) - Signed by [name] Merle, MD on 02/22/2016       Rectal cancer ([place])   09/22/2015 Imaging    CT ABD/PELVIS: Soft tissue fullness at the rectosigmoid junction. Otherwise, no acute process in the abdomen or pelvis      09/24/2015 Pathology Results    Adenocarcinoma      09/24/2015 Initial Diagnosis    Rectal cancer ([place])      09/24/2015 Procedure    COLONOSCOPY: Nonobsructive mass in rectosigmoid colon from 10-20 cum, circumferential (Dr. Collene Mares)      09/24/2015 Tumor Marker    CEA=4.1      09/25/2015 Imaging    CT CHEST: Mass within the upper-outer quadrant of the left breast warranting further evaluation. No suspicious pulmonary abnormality      10/25/2015 - 12/01/2015 Radiation Therapy    adjuvant irradiation to rectal cancer      10/25/2015 - 12/01/2015 Chemotherapy    Xeloda 1500 mg twice daily, with concurrent irradiation      01/31/2016 Surgery    Robot assisted low anterior resection of rectal cancer       01/31/2016 Pathology Results    Invasive colorectal adenocarcinoma, 3.5cm, G2, LVI(-), peri-neural invasion (-), ypT3, margins negative, 17 nodes all negative      ' HISTORY OF PRESENTING ILLNESS:  Karen Good 61 y.o. female is here because of her newly diagnosed rectal cancer. She presents to my clinic with  her husband.  She had had intermittent bloody stool for one month, somewhat mount, mixed with stool, with worsening fatigue, and 6 lbs weight loss, no significant abdominal pain, nausea, dyspnea, or other symptoms. She was seen by her PCP Dr. Jenny Reichmann and lab work showed anemia. She was sent to ED on 6/9 and received blood transfusion for Hb 6.9. She underwent a colonoscopy which showed a partially obstructive proximal rectal mass, biopsy showed adenocarcinoma. She was subsequently referred to Dr. Ardis Hughs and underwent EUS which showed a T3 N0 rectal mass. She was seen by [place] Dr. Marcello Moores last week, and is also scheduled to see radiation oncologist Dr. Lisbeth Renshaw tomorrow.  She has felt much better overall after blood transfusion. She did receive IV ferric gluconate 125 mg in the hospital, but is not on oral iron supplements. She feels well overall, has good appetite and energy level, but does feel anxious since her cancer diagnosis.  CURRENT THERAPY: Adjvant Xeloda '2000mg'$  in am, '1500mg'$  in evening, 2 weeks on, 1 week off; started on 03/11/2016  INTERIM HISTORY: Karen Good returns for follow-up. She has been tolerating Xeloda very well, with no significant GI side effects, or skin toxicity. She has good appetite and energy level, denies any significant pain, nausea, or other symptoms. Her weight is stable.  MEDICAL HISTORY:  Past Medical History:  Diagnosis Date  . Anemia   . BACK  PAIN 04/13/2010  . BACK STRAIN, LUMBAR 04/13/2010  . Cancer ([facility])    rectal  . DIABETES MELLITUS, TYPE II 06/02/2008  . History of blood transfusion   . History of chemotherapy   . History of radiation therapy   . HYPERLIPIDEMIA 06/02/2008  . HYPERTENSION 06/02/2008    SURGICAL HISTORY: Past Surgical History:  Procedure Laterality Date  . COLONOSCOPY Left 09/24/2015   Procedure: COLONOSCOPY;  [place]: Juanita Craver, MD;  Location: [facility] ENDOSCOPY;  Service: Endoscopy;  Laterality: Left;  . ESOPHAGOGASTRODUODENOSCOPY N/A 09/24/2015     Procedure: ESOPHAGOGASTRODUODENOSCOPY (EGD);  [place]: Juanita Craver, MD;  Location: [facility] ENDOSCOPY;  Service: Endoscopy;  Laterality: N/A;  . EUS N/A 10/05/2015   Procedure: LOWER ENDOSCOPIC ULTRASOUND (EUS);  [place]: Milus Banister, MD;  Location: [facility] ENDOSCOPY;  Service: Endoscopy;  Laterality: N/A;  . s/p breast biopsy  4/07   benign  . TUBAL LIGATION      SOCIAL HISTORY: Social History   Social History  . Marital status: Single    Spouse name: N/A  . Number of children: 2  . Years of education: N/A   Occupational History  .  [employer]   Social History Main Topics  . Smoking status: Never Smoker  . Smokeless tobacco: Never Used  . Alcohol use Yes     Comment: socially  . Drug use: No  . Sexual activity: Yes    Birth control/ protection: Post-menopausal   Other Topics Concern  . Not on file   Social History Narrative  . No narrative on file    FAMILY HISTORY: Family History  Problem Relation Age of Onset  . Diabetes Mother   . Heart disease Father   . Diabetes Father   . Hypertension Father   . Diabetes Sister   . Diabetes Other   . Diabetes Brother     ALLERGIES:  has No Known Allergies.  MEDICATIONS:  Current Outpatient Prescriptions  Medication Sig Dispense Refill  . amLODipine (NORVASC) 10 MG tablet Take 1 tablet (10 mg total) by mouth daily. 30 tablet 10  . atenolol (TENORMIN) 25 MG tablet Take 0.5 tablets (12.5 mg total) by mouth 2 (two) times daily. 60 tablet 2  . atorvastatin (LIPITOR) 20 MG tablet Take 1 tablet (20 mg total) by mouth daily. (Patient taking differently: Take 20 mg by mouth daily at 6 PM. ) 90 tablet 3  . capecitabine (XELODA) 500 MG tablet Take 4 tab in morning and 3 tab in evening, for 2 weeks on and one week off 98 tablet 3  . clonazePAM (KLONOPIN) 0.5 MG tablet Take 1 tablet (0.5 mg total) by mouth 2 (two) times daily as needed (anxiety). 30 tablet 0  . GLIPIZIDE XL 5 MG 24 hr tablet TAKE TWO TABLETS BY MOUTH DAILY 180 tablet  2  . hydrochlorothiazide (HYDRODIURIL) 25 MG tablet TAKE 1 TABLET (25 MG TOTAL) BY MOUTH DAILY. 90 tablet 1  . HYDROcodone-acetaminophen (NORCO) 5-325 MG tablet Take 1 tablet by mouth every 6 (six) hours as needed for moderate pain. To last 30 days 30 tablet 0  . ibuprofen (ADVIL,MOTRIN) 200 MG tablet Take 400 mg by mouth every 6 (six) hours as needed for mild pain.    Marland Kitchen irbesartan (AVAPRO) 300 MG tablet Take 1 tablet (300 mg total) by mouth at bedtime. 90 tablet 3  . KLOR-CON M20 20 MEQ tablet TAKE 1 TABLET EVERY DAY 60 tablet 0  . pioglitazone (ACTOS) 45 MG tablet TAKE 1 TABLET (45 MG  TOTAL) BY MOUTH DAILY.  3  . sitaGLIPtin-metformin (JANUMET) 50-1000 MG tablet Take 1 tablet by mouth 2 (two) times daily. 180 tablet 3   No current facility-administered medications for this visit.     REVIEW OF SYSTEMS: Constitutional: Denies fevers, chills or abnormal night sweats Eyes: Denies blurriness of vision, double vision or watery eyes Ears, nose, mouth, throat, and face: Denies mucositis or sore throat Respiratory: Denies cough, dyspnea or wheezes Cardiovascular: Denies palpitation, chest discomfort or lower extremity swelling Gastrointestinal:  Denies nausea, heartburn or change in bowel habits Skin: Denies abnormal skin rashes (+) Dry skin Lymphatics: Denies new lymphadenopathy or easy bruising Neurological:Denies numbness, tingling or new weaknesses Behavioral/Psych: Mood is stable, no new changes  All other systems were reviewed with the patient and are negative.  PHYSICAL EXAMINATION: ECOG PERFORMANCE STATUS: 0 - Asymptomatic  Vitals:   05/10/16 1448  BP: (!) 155/67  Pulse: 96  Resp: 17  Temp: 98 F (36.7 C)   Filed Weights   05/10/16 1448  Weight: 167 lb 8 oz (76 kg)    GENERAL:alert, no distress and comfortable SKIN: skin color, texture, turgor are normal, no rashes or significant lesions EYES: normal, conjunctiva are pink and non-injected, sclera clear OROPHARYNX:no  exudate, no erythema and lips, buccal mucosa, and tongue normal  NECK: supple, thyroid normal size, non-tender, without nodularity LYMPH:  no palpable lymphadenopathy in the cervical, axillary or inguinal LUNGS: clear to auscultation and percussion with normal breathing effort HEART: regular rate & rhythm, 2/6 systolic murmurs, no lower extremity edema ABDOMEN:abdomen soft, non-tender and normal bowel sounds, no organomegaly. Surgical incisions have healed well. Rectal exam was not performed. Musculoskeletal:no cyanosis of digits and no clubbing  PSYCH: alert & oriented x 3 with fluent speech NEURO: no focal motor/sensory deficits Breast exam: deferred   LABORATORY DATA:  I have reviewed the data as listed CBC Latest Ref Rng & Units 05/10/2016 04/22/2016 03/28/2016  WBC 3.9 - 10.3 10e3/uL 4.3 3.0(L) 4.2  Hemoglobin 11.6 - 15.9 g/dL 10.8(L) 11.5(L) 12.0  Hematocrit 34.8 - 46.6 % 33.1(L) 34.8 37.7  Platelets 145 - 400 10e3/uL 262 279 304   CMP Latest Ref Rng & Units 05/10/2016 04/22/2016 03/28/2016  Glucose 70 - 140 mg/dl 100 107 109  BUN 7.0 - 26.0 mg/dL 14.7 14.1 14.7  Creatinine 0.6 - 1.1 mg/dL 0.7 0.7 0.7  Sodium 136 - 145 mEq/L 142 140 142  Potassium 3.5 - 5.1 mEq/L 3.3(L) 3.4(L) 3.4(L)  Chloride 96 - 112 mEq/L - - -  CO2 22 - 29 mEq/L '25 26 26  '$ Calcium 8.4 - 10.4 mg/dL 9.7 10.0 9.9  Total Protein 6.4 - 8.3 g/dL 7.1 7.3 7.6  Total Bilirubin 0.20 - 1.20 mg/dL 1.32(H) 1.58(H) 0.89  Alkaline Phos 40 - 150 U/L 74 77 81  AST 5 - 34 U/L '15 15 15  '$ ALT 0 - 55 U/L '17 18 17   '$ Results for NAZIFA, TRINKA (MRN [id number]) as of 03/28/2016 16:06  Ref. Range 09/24/2015 14:49 02/22/2016 15:10  CEA Latest Ref Range: 0.0 - 4.7 ng/mL 4.1   CEA ([facility]) Latest Ref Range: 0.00 - 5.00 ng/mL  1.05    PATHOLOGY REPORT  Diagnosis 09/24/2015 Colon, biopsy, Rectal sigmoid mass - ADENOCARCINOMA. - SEE COMMENT. Microscopic Comment Dr. Orene Desanctis has reviewed the case and concurs with this  interpretation. Dr. Collene Mares was paged on 09/26/15. (JBK:gt, 09/26/15)  Diagnosis 01/31/2016 Colon, segmental resection for tumor, rectosigmoid - INVASIVE COLORECTAL ADENOCARCINOMA, 3.5 CM EXTENDING INTO PERIRECTAL CONNECTIVE  TISSUE. - MARGINS NOT INVOLVED. - SEVENTEEN BENIGN LYMPH NODE (0/17). Microscopic Comment COLON AND RECTUM (INCLUDING TRANS-ANAL RESECTION): Specimen: Rectosigmoid colon. Procedure: Resection. Tumor site: Proximal rectum. Specimen integrity: Intact. Macroscopic intactness of mesorectum: Complete. Macroscopic tumor perforation: No. Invasive tumor: Maximum size: 3.5 cm. Histologic type(s): Colorectal adenocarcinoma. Histologic grade and differentiation: G2: moderately differentiated/low grade Type of polyp in which invasive carcinoma arose: No residual polyp. Microscopic extension of invasive tumor: Into perirectal connective tissue. Lymph-Vascular invasion: Not identified. Peri-neural invasion: No. Tumor deposit(s) (discontinuous extramural extension): No Resection margins: Proximal margin: Free of tumor. Distal margin: Free of tumor. Circumferential (radial) (posterior ascending, posterior descending; lateral and posterior mid-rectum; and entire lower 1/3 rectum): Free of tumor. Mesenteric margin (sigmoid and transverse): N/A 1 of 5 Amended copy Amended FINAL for Kneisel, Nessa N [phone]) Microscopic Comment(continued) Distance closest margin (if all above margins negative): 3 cm from circumferential radial margin. Treatment effect (neo-adjuvant therapy): Yes. Additional polyp(s): Tubular adenoma with high grade dysplasia. Non-neoplastic findings: N/A Lymph nodes: number examined - 17; number positive: 0 Pathologic Staging: ypT3, ypN0, ypMX Ancillary studies: Microsatellite instability by PCR and mismatch repair protein by immunohistochemistry. (JDP:gt, 02/01/16)   RADIOGRAPHIC STUDIES: I have personally reviewed the radiological images as listed  and agreed with the findings in the report. No results found.   Diagnostic mammogram of left breast 12/12/2015 IMPRESSION: The asymmetry in the upper-outer quadrant of the left breast corresponds with the finding on the patient's CT. This has been stable well over 2 years (since 2011), consistent with a benign finding.  RECOMMENDATION: Screening mammogram in one year.(Code:SM-B-01Y)  I have discussed the findings and recommendations with the patient. Results were also provided in writing at the conclusion of the visit. If applicable, a reminder letter will be sent to the patient regarding the next appointment.  BI-RADS CATEGORY  2: Benign.  EUS 10/05/2015 Dr. Ardis Hughs  IMPRESSION:  Partially circumferential, 6cm long uT3N0 rectosigmoid adenocarcinoma with distal edge located 9cm from the anal verge. Submucosal injections of [place] Ink, at proximal and distal edges of the mass.  Colonoscopy and EGD 09/24/2015 Dr. Collene Mares  - Large circumferental polypoid mass in the rectosigmoid colon from 10-20 cm-biopsies Done. - Normal appearing, widely patent esophagus and GEJ. - Small hiatal hernia noted on retroflexion. - Mild antral gastritis-otherwise normal appearing stomach. - Normal examined duodenum. - No specimens collected.  ASSESSMENT & PLAN: 61 y.o. female with past medical history of diabetes and hypertension, presented with rectal bleeding. Colonoscopy showed a large proximal rectal mass.  1. Rectal adenocarcinoma, proximal rectum, uT3N0M0, stage IIA, ypT3N0M0  -I have previously reviewed her colonoscopy, EUS, and CT of abdomen pelvis finding with pt in details -The EUS showed a T3 lesion, no suspicious lymphadenopathy. The CT scan showed no evidence of adenopathy or distant metastasis. She has stage IIA disease -We previously reviewed the natural history of rectal cancer and risk of recurrence after surgery. Given the stage II a disease, she likely has moderate risk of recurrence.  We discussed the treatment option for stage IIA rectal cancer. The standard of care is neoadjuvant chemotherapy and radiation, followed by surgery, then adjuvant chemotherapy. -She completed neoadjuvant chemoradiation, tolerated it very well -I previously discussed her surgical pathology findings with patient and her husband in details, she had minimal response to neoadjuvant chemotherapy and radiation, still has residual T3 disease, no lymph node metastasis. -We previously discussed the role of adjuvant chemotherapy in rectal cancer, given the significant residual disease, I recommended adjuvant Xeloda '2000mg'$ /m2 in  the morning and '1500mg'$ /m2 in the evening for 14 days, then 7 days off, for 5 cycles. -she agreed with the plan, and started on 03/11/16 -She has been tolerating Xeloda very well. -Lab reviewed, she has mild anemia, slightly worse than before, and slightly elevated total bilirubin, that is on 3 weeks ago,  which are likely related to Xeloda. She will start cycle Xeloda on 1/29  2. Iron deficient anemia secondary to GI bleeding -Her iron studies on 09/22/15 and 10/11/15 were consistent with iron deficient anemia. -She has received blood transfusion and iv feraheme twice  -Repeat study has been normal -continue oral ferrous sulfate 1 tablet a day  3. Anxiety  -Secondary to cancer diagnosis. -Improved lately.  4. HTN and DM -Continue medication. She'll follow-up with her primary care physician -I discussed the impact of chemotherapy on her blood pressure and diabetes, we'll monitor her blood pressure and glucose level closely, and is just the medication if needed. -Her blood pressure has been high lately, I encouraged her to monitor at home and follow-up with her primary care physician  5. Left breast mass -CT chest revealed a left breast mass, however her screening mammogram was negative in 09/2015 -Her diagnostic mammogram showed the left breast lesion has been stable for a few years,  likely benign, no biopsy was recommended.  6. Hypokalemia  -She will continue to take a potassium supplement, and eat potassium rich food  -K 3.3 TODAY  7. Hyperbilirubinemia -Total Bilirubin 1.32 today, improved, likely related to Xeloda. -I advised the patient to watch the color of her urine (if it turns dark yellow/tea colored) and if the whites of her eyes turn yellow. She is to let us know immediately if she develops jaundice.  PLAN -She has started 4rd cycle of adjvant Xeloda '2000mg'$  in am, '1500mg'$  in evening, 2 weeks on, 1 week off, on 05/13/2016. -She will return in 3 weeks for lab test. I will be out of that week, she will call us after lab test to review the results, if adequate (no significant neutropenia or thrombocytopenia), and no worsening liver function, then she will start cycle 5 (last cycle) on 2/19.  -Lab and follow-up in 7-8 weeks.  All questions were answered. The patient knows to call the clinic with any problems, questions or concerns.  I spent 20 minutes counseling the patient face to face. The total time spent in the appointment was 25 minutes and more than 50% was on counseling.     [name] Merle, MD 05/10/2016

## 2016-05-09 NOTE — Telephone Encounter (Signed)
[facility] shipped pts Capecitabine on 05/08/16 for delivery today (05/09/16). Copay not available but was set up w/ [facility].  Kennith Center, Pharm.D., CPP 05/09/2016@1 :39 PM Oral Chemo clinic

## 2016-05-10 ENCOUNTER — Other Ambulatory Visit (HOSPITAL_BASED_OUTPATIENT_CLINIC_OR_DEPARTMENT_OTHER): Payer: 59

## 2016-05-10 ENCOUNTER — Ambulatory Visit (HOSPITAL_BASED_OUTPATIENT_CLINIC_OR_DEPARTMENT_OTHER): Payer: 59 | Admitting: Hematology

## 2016-05-10 ENCOUNTER — Encounter: Payer: Self-pay | Admitting: Hematology

## 2016-05-10 ENCOUNTER — Telehealth: Payer: Self-pay | Admitting: Hematology

## 2016-05-10 VITALS — BP 155/67 | HR 96 | Temp 98.0°F | Resp 17 | Ht 65.0 in | Wt 167.5 lb

## 2016-05-10 DIAGNOSIS — C20 Malignant neoplasm of rectum: Secondary | ICD-10-CM

## 2016-05-10 DIAGNOSIS — I10 Essential (primary) hypertension: Secondary | ICD-10-CM | POA: Diagnosis not present

## 2016-05-10 DIAGNOSIS — E119 Type 2 diabetes mellitus without complications: Secondary | ICD-10-CM | POA: Diagnosis not present

## 2016-05-10 DIAGNOSIS — D500 Iron deficiency anemia secondary to blood loss (chronic): Secondary | ICD-10-CM | POA: Diagnosis not present

## 2016-05-10 DIAGNOSIS — F419 Anxiety disorder, unspecified: Secondary | ICD-10-CM

## 2016-05-10 LAB — CBC WITH DIFFERENTIAL/PLATELET
BASO%: 0.2 % (ref 0.0–2.0)
Basophils Absolute: 0 10*3/uL (ref 0.0–0.1)
EOS%: 0.5 % (ref 0.0–7.0)
Eosinophils Absolute: 0 10*3/uL (ref 0.0–0.5)
HCT: 33.1 % — ABNORMAL LOW (ref 34.8–46.6)
HGB: 10.8 g/dL — ABNORMAL LOW (ref 11.6–15.9)
LYMPH%: 13.2 % — AB (ref 14.0–49.7)
MCH: 28.7 pg (ref 25.1–34.0)
MCHC: 32.6 g/dL (ref 31.5–36.0)
MCV: 88 fL (ref 79.5–101.0)
MONO#: 0.4 10*3/uL (ref 0.1–0.9)
MONO%: 8.3 % (ref 0.0–14.0)
NEUT#: 3.4 10*3/uL (ref 1.5–6.5)
NEUT%: 77.8 % — AB (ref 38.4–76.8)
PLATELETS: 262 10*3/uL (ref 145–400)
RBC: 3.76 10*6/uL (ref 3.70–5.45)
RDW: 19.4 % — ABNORMAL HIGH (ref 11.2–14.5)
WBC: 4.3 10*3/uL (ref 3.9–10.3)
lymph#: 0.6 10*3/uL — ABNORMAL LOW (ref 0.9–3.3)

## 2016-05-10 LAB — COMPREHENSIVE METABOLIC PANEL
ALT: 17 U/L (ref 0–55)
ANION GAP: 11 meq/L (ref 3–11)
AST: 15 U/L (ref 5–34)
Albumin: 4.1 g/dL (ref 3.5–5.0)
Alkaline Phosphatase: 74 U/L (ref 40–150)
BUN: 14.7 mg/dL (ref 7.0–26.0)
CHLORIDE: 105 meq/L (ref 98–109)
CO2: 25 meq/L (ref 22–29)
Calcium: 9.7 mg/dL (ref 8.4–10.4)
Creatinine: 0.7 mg/dL (ref 0.6–1.1)
Glucose: 100 mg/dl (ref 70–140)
POTASSIUM: 3.3 meq/L — AB (ref 3.5–5.1)
Sodium: 142 mEq/L (ref 136–145)
Total Bilirubin: 1.32 mg/dL — ABNORMAL HIGH (ref 0.20–1.20)
Total Protein: 7.1 g/dL (ref 6.4–8.3)

## 2016-05-10 NOTE — Telephone Encounter (Signed)
Appointments scheduled per 12/6 LOS. Patient given AVS report and calendars with future scheduled appointments. °

## 2016-05-15 ENCOUNTER — Other Ambulatory Visit: Payer: Self-pay | Admitting: Hematology

## 2016-05-15 DIAGNOSIS — E876 Hypokalemia: Secondary | ICD-10-CM

## 2016-05-15 DIAGNOSIS — C20 Malignant neoplasm of rectum: Secondary | ICD-10-CM

## 2016-05-22 ENCOUNTER — Other Ambulatory Visit: Payer: Self-pay | Admitting: Internal Medicine

## 2016-05-31 ENCOUNTER — Other Ambulatory Visit (HOSPITAL_BASED_OUTPATIENT_CLINIC_OR_DEPARTMENT_OTHER): Payer: 59

## 2016-05-31 DIAGNOSIS — D500 Iron deficiency anemia secondary to blood loss (chronic): Secondary | ICD-10-CM | POA: Diagnosis not present

## 2016-05-31 DIAGNOSIS — D509 Iron deficiency anemia, unspecified: Secondary | ICD-10-CM

## 2016-05-31 DIAGNOSIS — C20 Malignant neoplasm of rectum: Secondary | ICD-10-CM | POA: Diagnosis not present

## 2016-05-31 LAB — CBC WITH DIFFERENTIAL/PLATELET
BASO%: 0.2 % (ref 0.0–2.0)
BASOS ABS: 0 10*3/uL (ref 0.0–0.1)
EOS%: 0.5 % (ref 0.0–7.0)
Eosinophils Absolute: 0 10*3/uL (ref 0.0–0.5)
HEMATOCRIT: 32.9 % — AB (ref 34.8–46.6)
HGB: 10.7 g/dL — ABNORMAL LOW (ref 11.6–15.9)
LYMPH#: 0.5 10*3/uL — AB (ref 0.9–3.3)
LYMPH%: 11 % — AB (ref 14.0–49.7)
MCH: 29.4 pg (ref 25.1–34.0)
MCHC: 32.5 g/dL (ref 31.5–36.0)
MCV: 90.4 fL (ref 79.5–101.0)
MONO#: 0.3 10*3/uL (ref 0.1–0.9)
MONO%: 7.6 % (ref 0.0–14.0)
NEUT#: 3.3 10*3/uL (ref 1.5–6.5)
NEUT%: 80.7 % — AB (ref 38.4–76.8)
Platelets: 271 10*3/uL (ref 145–400)
RBC: 3.64 10*6/uL — AB (ref 3.70–5.45)
RDW: 20.9 % — ABNORMAL HIGH (ref 11.2–14.5)
WBC: 4.1 10*3/uL (ref 3.9–10.3)

## 2016-05-31 LAB — COMPREHENSIVE METABOLIC PANEL
ALT: 21 U/L (ref 0–55)
AST: 16 U/L (ref 5–34)
Albumin: 4.2 g/dL (ref 3.5–5.0)
Alkaline Phosphatase: 77 U/L (ref 40–150)
Anion Gap: 12 mEq/L — ABNORMAL HIGH (ref 3–11)
BUN: 15.1 mg/dL (ref 7.0–26.0)
CALCIUM: 9.7 mg/dL (ref 8.4–10.4)
CO2: 24 mEq/L (ref 22–29)
Chloride: 106 mEq/L (ref 98–109)
Creatinine: 0.7 mg/dL (ref 0.6–1.1)
EGFR: >90 mL/min/{1.73_m2} (ref >90)
Glucose: 79 mg/dl (ref 70–140)
POTASSIUM: 3.2 meq/L — AB (ref 3.5–5.1)
Sodium: 142 mEq/L (ref 136–145)
Total Bilirubin: 1.27 mg/dL — ABNORMAL HIGH (ref 0.20–1.20)
Total Protein: 7.3 g/dL (ref 6.4–8.3)

## 2016-06-03 LAB — CEA (IN HOUSE-CHCC): CEA (CHCC-In House): 1.19 ng/mL (ref 0.00–5.00)

## 2016-06-03 LAB — FERRITIN: Ferritin: 67 ng/ml (ref 9–269)

## 2016-06-05 ENCOUNTER — Other Ambulatory Visit: Payer: Self-pay | Admitting: Internal Medicine

## 2016-06-06 ENCOUNTER — Telehealth: Payer: Self-pay | Admitting: Internal Medicine

## 2016-06-06 MED ORDER — LANCETS MISC. MISC: 11 refills | Status: AC

## 2016-06-06 MED ORDER — ONETOUCH ULTRA 2 W/DEVICE KIT
PACK | 0 refills | Status: AC
Start: 2016-06-06 — End: ?

## 2016-06-06 MED ORDER — HYDROCODONE-ACETAMINOPHEN 5-325 MG PO TABS: 1.0000 | ORAL_TABLET | Freq: Four times a day (QID) | ORAL | 0 refills | Status: DC | PRN

## 2016-06-06 MED ORDER — GLUCOSE BLOOD VI STRP: ORAL_STRIP | 12 refills | Status: DC

## 2016-06-06 NOTE — Telephone Encounter (Signed)
Left detail massage for pt to pick up written rx before 5 pm and the glucose meter sent in to [facility] already.

## 2016-06-06 NOTE — Telephone Encounter (Signed)
Pt called request refill for Hydrocodone 5-325mg   Pt also request new glucose meter and test strips send into [facility] on [address].   Please call pt.

## 2016-06-06 NOTE — Telephone Encounter (Signed)
Done hardcopy to [facility]  Also meter and strips and lancets sent to pharmacy

## 2016-06-24 NOTE — Progress Notes (Signed)
[place]  [phone]) [phone] [phone]  Clinic Follow up Note   Patient Care Team: Biagio Borg, MD as PCP - General Kyung Rudd, MD as Consulting Physician (Radiation Oncology) Leighton Ruff, MD as Consulting Physician (General Surgery) Milus Banister, MD as Attending Physician (Gastroenterology) Tania Ade, RN as Registered Nurse 06/28/2016   CHIEF COMPLAINTS:  Follow up rectal cancer  Oncology History   Rectal cancer [facility])   Staging form: Colon and Rectum, AJCC 7th Edition   - Clinical: Stage IIA (T3, N0, M0) - Signed by [name] Merle, MD on 10/10/2015   - Pathologic stage from 01/31/2016: Stage IIA (T3, N0, cM0) - Signed by [name] Merle, MD on 02/22/2016       Rectal cancer ([place])   09/22/2015 Imaging    CT ABD/PELVIS: Soft tissue fullness at the rectosigmoid junction. Otherwise, no acute process in the abdomen or pelvis      09/24/2015 Pathology Results    Adenocarcinoma      09/24/2015 Initial Diagnosis    Rectal cancer ([place])      09/24/2015 Procedure    COLONOSCOPY: Nonobsructive mass in rectosigmoid colon from 10-20 cum, circumferential (Dr. Collene Mares)      09/24/2015 Tumor Marker    CEA=4.1      09/25/2015 Imaging    CT CHEST: Mass within the upper-outer quadrant of the left breast warranting further evaluation. No suspicious pulmonary abnormality      10/25/2015 - 12/01/2015 Radiation Therapy    adjuvant irradiation to rectal cancer      10/25/2015 - 12/01/2015 Chemotherapy    Xeloda 1500 mg twice daily, with concurrent irradiation      01/31/2016 Surgery    Robot assisted low anterior resection of rectal cancer       01/31/2016 Pathology Results    Invasive colorectal adenocarcinoma, 3.5cm, G2, LVI(-), peri-neural invasion (-), ypT3, margins negative, 17 nodes all negative       03/11/2016 - 06/14/2016 Chemotherapy    Adjuvant Xeloda 2000 mg in a.m., 1500 mg a evening, 2 weeks on and one-week off, a total 4-5 cycles.       ' HISTORY OF PRESENTING ILLNESS:  Karen Good 61 y.o. female is here because of her newly diagnosed rectal cancer. She presents to my clinic with her husband.  She had had intermittent bloody stool for one month, somewhat mount, mixed with stool, with worsening fatigue, and 6 lbs weight loss, no significant abdominal pain, nausea, dyspnea, or other symptoms. She was seen by her PCP Dr. Jenny Reichmann and lab work showed anemia. She was sent to ED on 6/9 and received blood transfusion for Hb 6.9. She underwent a colonoscopy which showed a partially obstructive proximal rectal mass, biopsy showed adenocarcinoma. She was subsequently referred to Dr. Ardis Hughs and underwent EUS which showed a T3 N0 rectal mass. She was seen by [place] Dr. Marcello Moores last week, and is also scheduled to see radiation oncologist Dr. Lisbeth Renshaw tomorrow.  She has felt much better overall after blood transfusion. She did receive IV ferric gluconate 125 mg in the hospital, but is not on oral iron supplements. She feels well overall, has good appetite and energy level, but does feel anxious since her cancer diagnosis.  CURRENT THERAPY: Surveillance  INTERIM HISTORY:  Karen Good returns for follow-up. She is doing well today. She finished her Xeloda on 06/14/2016. Denies problems with it. Denies loss of appetite, numbness, tingling, color change in nails, or any other concerns.   MEDICAL HISTORY:  Past Medical History:  Diagnosis Date  . Anemia   . BACK PAIN 04/13/2010  . BACK STRAIN, LUMBAR 04/13/2010  . Cancer (HCC)    rectal  . DIABETES MELLITUS, TYPE II 06/02/2008  . History of blood transfusion   . History of chemotherapy   . History of radiation therapy   . HYPERLIPIDEMIA 06/02/2008  . HYPERTENSION 06/02/2008    SURGICAL HISTORY: Past Surgical History:  Procedure Laterality Date  . COLONOSCOPY Left 09/24/2015   Procedure: COLONOSCOPY;  Reuss: Juanita Craver, MD;  Location: [facility] ENDOSCOPY;  Service: Endoscopy;  Laterality: Left;  .  ESOPHAGOGASTRODUODENOSCOPY N/A 09/24/2015   Procedure: ESOPHAGOGASTRODUODENOSCOPY (EGD);  Raby: Juanita Craver, MD;  Location: [facility] ENDOSCOPY;  Service: Endoscopy;  Laterality: N/A;  . EUS N/A 10/05/2015   Procedure: LOWER ENDOSCOPIC ULTRASOUND (EUS);  Blanford: Milus Banister, MD;  Location: [facility] ENDOSCOPY;  Service: Endoscopy;  Laterality: N/A;  . s/p breast biopsy  4/07   benign  . TUBAL LIGATION      SOCIAL HISTORY: Social History   Social History  . Marital status: Single    Spouse name: N/A  . Number of children: 2  . Years of education: N/A   Occupational History  .  [employer]   Social History Main Topics  . Smoking status: Never Smoker  . Smokeless tobacco: Never Used  . Alcohol use Yes     Comment: socially  . Drug use: No  . Sexual activity: Yes    Birth control/ protection: Post-menopausal   Other Topics Concern  . Not on file   Social History Narrative  . No narrative on file    FAMILY HISTORY: Family History  Problem Relation Age of Onset  . Diabetes Mother   . Heart disease Father   . Diabetes Father   . Hypertension Father   . Diabetes Sister   . Diabetes Other   . Diabetes Brother     ALLERGIES:  has No Known Allergies.  MEDICATIONS:  Current Outpatient Prescriptions  Medication Sig Dispense Refill  . amLODipine (NORVASC) 10 MG tablet Take 1 tablet (10 mg total) by mouth daily. 30 tablet 10  . atenolol (TENORMIN) 25 MG tablet Take 0.5 tablets (12.5 mg total) by mouth 2 (two) times daily. 60 tablet 2  . atorvastatin (LIPITOR) 20 MG tablet Take 1 tablet (20 mg total) by mouth daily. (Patient taking differently: Take 20 mg by mouth daily at 6 PM. ) 90 tablet 3  . Blood Glucose Monitoring Suppl (ONE TOUCH ULTRA 2) w/Device KIT Use as directed daily 1 each 0  . glipiZIDE (GLUCOTROL XL) 5 MG 24 hr tablet TAKE TWO TABLETS BY MOUTH DAILY 180 tablet 1  . glucose blood (ONE TOUCH ULTRA TEST) test strip Use as instructed once daily 100 each 12  .  hydrochlorothiazide (HYDRODIURIL) 25 MG tablet TAKE 1 TABLET (25 MG TOTAL) BY MOUTH DAILY. 90 tablet 1  . HYDROcodone-acetaminophen (NORCO) 5-325 MG tablet Take 1 tablet by mouth every 6 (six) hours as needed for moderate pain. To last 30 days 30 tablet 0  . ibuprofen (ADVIL,MOTRIN) 200 MG tablet Take 400 mg by mouth every 6 (six) hours as needed for mild pain.    Marland Kitchen irbesartan (AVAPRO) 300 MG tablet Take 1 tablet (300 mg total) by mouth at bedtime. 90 tablet 3  . KLOR-CON M20 20 MEQ tablet TAKE 1 TABLET EVERY DAY 60 tablet 0  . Lancets Misc. MISC Use as directed once daily 100 each 11  .  pioglitazone (ACTOS) 45 MG tablet TAKE 1 TABLET (45 MG TOTAL) BY MOUTH DAILY. 90 tablet 2  . sitaGLIPtin-metformin (JANUMET) 50-1000 MG tablet Take 1 tablet by mouth 2 (two) times daily. 180 tablet 3   No current facility-administered medications for this visit.     REVIEW OF SYSTEMS: Constitutional: Denies fevers, chills or abnormal night sweats Eyes: Denies blurriness of vision, double vision or watery eyes Ears, nose, mouth, throat, and face: Denies mucositis or sore throat Respiratory: Denies cough, dyspnea or wheezes Cardiovascular: Denies palpitation, chest discomfort or lower extremity swelling Gastrointestinal:  Denies nausea, heartburn or change in bowel habits Skin: Denies abnormal skin rashes (+) Dry skin Lymphatics: Denies new lymphadenopathy or easy bruising Neurological:Denies numbness, tingling or new weaknesses Behavioral/Psych: Mood is stable, no new changes  All other systems were reviewed with the patient and are negative.  PHYSICAL EXAMINATION: ECOG PERFORMANCE STATUS: 0 - Asymptomatic  Vitals:   06/28/16 1418 06/28/16 1501  BP: (!) 162/72 136/79  Pulse: (!) 103 100  Resp: 18 19  Temp: 98.2 F (36.8 C) 97.8 F (36.6 C)   Filed Weights   06/28/16 1418 06/28/16 1501  Weight: 171 lb 14.4 oz (78 kg) 201 lb 9.6 oz (91.4 kg)   GENERAL:alert, no distress and comfortable SKIN:  skin color, texture, turgor are normal, no rashes or significant lesions EYES: normal, conjunctiva are pink and non-injected, sclera clear OROPHARYNX:no exudate, no erythema and lips, buccal mucosa, and tongue normal  NECK: supple, thyroid normal size, non-tender, without nodularity LYMPH:  no palpable lymphadenopathy in the cervical, axillary or inguinal LUNGS: clear to auscultation and percussion with normal breathing effort HEART: regular rate & rhythm, 2/6 systolic murmurs, no lower extremity edema ABDOMEN:abdomen soft, non-tender and normal bowel sounds, no organomegaly. Surgical incisions have healed well. Rectal exam was not performed. Musculoskeletal:no cyanosis of digits and no clubbing  PSYCH: alert & oriented x 3 with fluent speech NEURO: no focal motor/sensory deficits Breast exam: deferred   LABORATORY DATA:  I have reviewed the data as listed CBC Latest Ref Rng & Units 06/28/2016 05/31/2016 05/10/2016  WBC 3.9 - 10.3 10e3/uL 4.5 4.1 4.3  Hemoglobin 11.6 - 15.9 g/dL 11.4(L) 10.7(L) 10.8(L)  Hematocrit 34.8 - 46.6 % 35.1 32.9(L) 33.1(L)  Platelets 145 - 400 10e3/uL 250 271 262   CMP Latest Ref Rng & Units 06/28/2016 05/31/2016 05/10/2016  Glucose 70 - 140 mg/dl 86 79 100  BUN 7.0 - 26.0 mg/dL 17.6 15.1 14.7  Creatinine 0.6 - 1.1 mg/dL 0.7 0.7 0.7  Sodium 136 - 145 mEq/L 142 142 142  Potassium 3.5 - 5.1 mEq/L 3.4(L) 3.2(L) 3.3(L)  Chloride 96 - 112 mEq/L - - -  CO2 22 - 29 mEq/L '24 24 25  '$ Calcium 8.4 - 10.4 mg/dL 10.0 9.7 9.7  Total Protein 6.4 - 8.3 g/dL 7.7 7.3 7.1  Total Bilirubin 0.20 - 1.20 mg/dL 1.41(H) 1.27(H) 1.32(H)  Alkaline Phos 40 - 150 U/L 83 77 74  AST 5 - 34 U/L '21 16 15  '$ ALT 0 - 55 U/L '29 21 17   '$ Results for MONTIE, GELARDI (MRN [id number]) as of 06/24/2016 10:02  Ref. Range 09/24/2015 14:49 02/22/2016 15:10 05/31/2016 14:44  CEA ([facility]) Latest Ref Range: 0.00 - 5.00 ng/mL  1.05 1.19   PATHOLOGY REPORT  Diagnosis 09/24/2015 Colon, biopsy, Rectal  sigmoid mass - ADENOCARCINOMA. - SEE COMMENT. Microscopic Comment Dr. Orene Desanctis has reviewed the case and concurs with this interpretation. Dr. Collene Mares was paged on 09/26/15. (JBK:gt, 09/26/15)  Diagnosis 01/31/2016 Colon, segmental resection for tumor, rectosigmoid - INVASIVE COLORECTAL ADENOCARCINOMA, 3.5 CM EXTENDING INTO PERIRECTAL CONNECTIVE TISSUE. - MARGINS NOT INVOLVED. - SEVENTEEN BENIGN LYMPH NODE (0/17). Microscopic Comment COLON AND RECTUM (INCLUDING TRANS-ANAL RESECTION): Specimen: Rectosigmoid colon. Procedure: Resection. Tumor site: Proximal rectum. Specimen integrity: Intact. Macroscopic intactness of mesorectum: Complete. Macroscopic tumor perforation: No. Invasive tumor: Maximum size: 3.5 cm. Histologic type(s): Colorectal adenocarcinoma. Histologic grade and differentiation: G2: moderately differentiated/low grade Type of polyp in which invasive carcinoma arose: No residual polyp. Microscopic extension of invasive tumor: Into perirectal connective tissue. Lymph-Vascular invasion: Not identified. Peri-neural invasion: No. Tumor deposit(s) (discontinuous extramural extension): No Resection margins: Proximal margin: Free of tumor. Distal margin: Free of tumor. Circumferential (radial) (posterior ascending, posterior descending; lateral and posterior mid-rectum; and entire lower 1/3 rectum): Free of tumor. Mesenteric margin (sigmoid and transverse): N/A 1 of 5 Amended copy Amended FINAL for Cassata, Kruti N [id number]) Microscopic Comment(continued) Distance closest margin (if all above margins negative): 3 cm from circumferential radial margin. Treatment effect (neo-adjuvant therapy): Yes. Additional polyp(s): Tubular adenoma with high grade dysplasia. Non-neoplastic findings: N/A Lymph nodes: number examined - 17; number positive: 0 Pathologic Staging: ypT3, ypN0, ypMX Ancillary studies: Microsatellite instability by PCR and mismatch repair protein by  immunohistochemistry. (JDP:gt, 02/01/16)   RADIOGRAPHIC STUDIES: I have personally reviewed the radiological images as listed and agreed with the findings in the report. No results found.   Diagnostic mammogram of left breast 12/12/2015 IMPRESSION: The asymmetry in the upper-outer quadrant of the left breast corresponds with the finding on the patient's CT. This has been stable well over 2 years (since 2011), consistent with a benign finding.  RECOMMENDATION: Screening mammogram in one year.(Code:SM-B-01Y)  I have discussed the findings and recommendations with the patient. Results were also provided in writing at the conclusion of the visit. If applicable, a reminder letter will be sent to the patient regarding the next appointment.  BI-RADS CATEGORY  2: Benign.  EUS 10/05/2015 Dr. Ardis Hughs  IMPRESSION:  Partially circumferential, 6cm long uT3N0 rectosigmoid adenocarcinoma with distal edge located 9cm from the anal verge. Submucosal injections of [place] Ink, at proximal and distal edges of the mass.  Colonoscopy and EGD 09/24/2015 Dr. Collene Mares  - Large circumferental polypoid mass in the rectosigmoid colon from 10-20 cm-biopsies Done. - Normal appearing, widely patent esophagus and GEJ. - Small hiatal hernia noted on retroflexion. - Mild antral gastritis-otherwise normal appearing stomach. - Normal examined duodenum. - No specimens collected.  ASSESSMENT & PLAN: 61 y.o. female with past medical history of diabetes and hypertension, presented with rectal bleeding. Colonoscopy showed a large proximal rectal mass.  1. Rectal adenocarcinoma, proximal rectum, uT3N0M0, stage IIA, ypT3N0M0  -I have previously reviewed her colonoscopy, EUS, and CT of abdomen pelvis finding with pt in details -The EUS showed a T3 lesion, no suspicious lymphadenopathy. The CT scan showed no evidence of adenopathy or distant metastasis. She has stage IIA disease -We previously reviewed the natural history  of rectal cancer and risk of recurrence after surgery. Given the stage II a disease, she likely has moderate risk of recurrence. We discussed the treatment option for stage IIA rectal cancer. The standard of care is neoadjuvant chemotherapy and radiation, followed by surgery, then adjuvant chemotherapy. -She completed neoadjuvant chemoradiation, tolerated it very well -I previously discussed her surgical pathology findings with patient and her husband in details, she had minimal response to neoadjuvant chemotherapy and radiation, still has residual T3 disease, no lymph node metastasis. -We previously discussed the  role of adjuvant chemotherapy in rectal cancer, given the significant residual disease, I recommended adjuvant Xeloda '2000mg'$ /m2 in the morning and '1500mg'$ /m2 in the evening for 14 days, then 7 days off, for 5 cycles. -she has completed adjuvant Xeloda  -She has an appointment with Dr. Marcello Moores within the next month or two.  -She is clinically doing well, exam is unremarkable. Lab reviewed Her anemia has improved, mild elevated total bilirubin, overall stable, likely secondary to Xeloda. No clinical concern for recurrence. We'll continue surveillance  2. Iron deficient anemia secondary to GI bleeding -Her iron studies on 09/22/15 and 10/11/15 were consistent with iron deficient anemia. -She has received blood transfusion and iv feraheme twice  -Repeat study has been normal -continue oral ferrous sulfate 1 tablet a day  3. Anxiety  -Secondary to cancer diagnosis. -Improved lately.  4. HTN and DM -Continue medication. She'll follow-up with her primary care physician -I again discussed the impact of chemotherapy on her blood pressure and diabetes, we'll monitor her blood pressure and glucose level closely, and is just the medication if needed. -Her blood pressure has been high lately, I encouraged her to monitor at home and follow-up with her primary care physician  5. Left breast  mass -Previous CT chest revealed a left breast mass, however her screening mammogram was negative in 09/2015 -Her diagnostic mammogram showed the left breast lesion has been stable for a few years, likely benign, no biopsy was recommended.  6. Hypokalemia  -mild and stable, K 3.4 today. She will continue to take a potassium supplement, and eat potassium rich food   7. Hyperbilirubinemia -Total Bilirubin 1.41 today, improved, likely related to Xeloda. -I advised the patient to watch the color of her urine (if it turns dark yellow/tea colored) and if the whites of her eyes turn yellow. She is to let us know immediately if she develops jaundice.  PLAN -Continue cancer surveillance  -Repeat CT scan before next visit.  -Another colonoscopy before next visit. She will call Dr. Lorie Apley office  -I will write a letter to his insurance company for a claim. She will let me know if she needs anything else -Lab and follow-up in 4 months, about 3 months after her visit with Dr. Marcello Moores.    All questions were answered. The patient knows to call the clinic with any problems, questions or concerns.  I spent 20 minutes counseling the patient face to face. The total time spent in the appointment was 25 minutes and more than 50% was on counseling.  This document serves as a record of services personally performed by [name] Merle, MD. It was created on her behalf by Martinique Casey, a trained medical scribe. The creation of this record is based on the scribe's personal observations and the provider's statements to them. This document has been checked and approved by the attending provider.  I have reviewed the above documentation for accuracy and completeness and I agree with the above.   [name] Merle, MD 06/28/2016

## 2016-06-28 ENCOUNTER — Ambulatory Visit (HOSPITAL_BASED_OUTPATIENT_CLINIC_OR_DEPARTMENT_OTHER): Payer: 59 | Admitting: Hematology

## 2016-06-28 ENCOUNTER — Encounter: Payer: Self-pay | Admitting: Hematology

## 2016-06-28 ENCOUNTER — Other Ambulatory Visit (HOSPITAL_BASED_OUTPATIENT_CLINIC_OR_DEPARTMENT_OTHER): Payer: 59

## 2016-06-28 ENCOUNTER — Telehealth: Payer: Self-pay | Admitting: Hematology

## 2016-06-28 VITALS — BP 136/79 | HR 100 | Temp 97.8°F | Resp 19 | Ht 65.0 in | Wt 201.6 lb

## 2016-06-28 DIAGNOSIS — E119 Type 2 diabetes mellitus without complications: Secondary | ICD-10-CM

## 2016-06-28 DIAGNOSIS — E876 Hypokalemia: Secondary | ICD-10-CM

## 2016-06-28 DIAGNOSIS — C20 Malignant neoplasm of rectum: Secondary | ICD-10-CM

## 2016-06-28 DIAGNOSIS — F419 Anxiety disorder, unspecified: Secondary | ICD-10-CM

## 2016-06-28 DIAGNOSIS — N63 Unspecified lump in unspecified breast: Secondary | ICD-10-CM

## 2016-06-28 DIAGNOSIS — D509 Iron deficiency anemia, unspecified: Secondary | ICD-10-CM

## 2016-06-28 DIAGNOSIS — I10 Essential (primary) hypertension: Secondary | ICD-10-CM

## 2016-06-28 DIAGNOSIS — D500 Iron deficiency anemia secondary to blood loss (chronic): Secondary | ICD-10-CM | POA: Diagnosis not present

## 2016-06-28 LAB — COMPREHENSIVE METABOLIC PANEL
ALBUMIN: 4.4 g/dL (ref 3.5–5.0)
ALK PHOS: 83 U/L (ref 40–150)
ALT: 29 U/L (ref 0–55)
AST: 21 U/L (ref 5–34)
Anion Gap: 12 mEq/L — ABNORMAL HIGH (ref 3–11)
BILIRUBIN TOTAL: 1.41 mg/dL — AB (ref 0.20–1.20)
BUN: 17.6 mg/dL (ref 7.0–26.0)
CO2: 24 mEq/L (ref 22–29)
Calcium: 10 mg/dL (ref 8.4–10.4)
Chloride: 105 mEq/L (ref 98–109)
Creatinine: 0.7 mg/dL (ref 0.6–1.1)
EGFR: >90 mL/min/{1.73_m2} (ref >90)
Glucose: 86 mg/dl (ref 70–140)
POTASSIUM: 3.4 meq/L — AB (ref 3.5–5.1)
SODIUM: 142 meq/L (ref 136–145)
TOTAL PROTEIN: 7.7 g/dL (ref 6.4–8.3)

## 2016-06-28 LAB — CBC WITH DIFFERENTIAL/PLATELET
BASO%: 0.3 % (ref 0.0–2.0)
BASOS ABS: 0 10*3/uL (ref 0.0–0.1)
EOS ABS: 0 10*3/uL (ref 0.0–0.5)
EOS%: 0.7 % (ref 0.0–7.0)
HCT: 35.1 % (ref 34.8–46.6)
HEMOGLOBIN: 11.4 g/dL — AB (ref 11.6–15.9)
LYMPH%: 13.3 % — ABNORMAL LOW (ref 14.0–49.7)
MCH: 30.4 pg (ref 25.1–34.0)
MCHC: 32.5 g/dL (ref 31.5–36.0)
MCV: 93.6 fL (ref 79.5–101.0)
MONO#: 0.3 10*3/uL (ref 0.1–0.9)
MONO%: 6.9 % (ref 0.0–14.0)
NEUT#: 3.6 10*3/uL (ref 1.5–6.5)
NEUT%: 78.8 % — AB (ref 38.4–76.8)
Platelets: 250 10*3/uL (ref 145–400)
RBC: 3.76 10*6/uL (ref 3.70–5.45)
RDW: 20.6 % — AB (ref 11.2–14.5)
WBC: 4.5 10*3/uL (ref 3.9–10.3)
lymph#: 0.6 10*3/uL — ABNORMAL LOW (ref 0.9–3.3)

## 2016-06-28 LAB — IRON AND TIBC
%SAT: 14 % — ABNORMAL LOW (ref 21–57)
Iron: 66 ug/dL (ref 41–142)
TIBC: 473 ug/dL — ABNORMAL HIGH (ref 236–444)
UIBC: 406 ug/dL — AB (ref 120–384)

## 2016-06-28 LAB — FERRITIN: Ferritin: 73 ng/ml (ref 9–269)

## 2016-06-28 NOTE — Telephone Encounter (Signed)
Gave patient AVS and calender per 3/16 los. [facility] to contact patient once Order is put in for CT

## 2016-07-08 ENCOUNTER — Telehealth: Payer: Self-pay | Admitting: Internal Medicine

## 2016-07-08 MED ORDER — SITAGLIPTIN PHOS-METFORMIN HCL 50-1000 MG PO TABS: 1.0000 | ORAL_TABLET | Freq: Two times a day (BID) | ORAL | 3 refills | Status: DC

## 2016-07-08 NOTE — Telephone Encounter (Signed)
Pt is requesting a refill of Janumet. Please send to [facility] on [address]. Please advise.

## 2016-07-08 NOTE — Telephone Encounter (Signed)
Medication has been sent.  

## 2016-07-10 ENCOUNTER — Telehealth: Payer: Self-pay | Admitting: Internal Medicine

## 2016-07-10 MED ORDER — SITAGLIPTIN PHOS-METFORMIN HCL 50-1000 MG PO TABS: 1.0000 | ORAL_TABLET | Freq: Two times a day (BID) | ORAL | 3 refills | Status: DC

## 2016-07-10 NOTE — Addendum Note (Signed)
Addended by: Juliet Rude on: 07/10/2016 03:01 PM   Modules accepted: Orders

## 2016-07-10 NOTE — Telephone Encounter (Signed)
Pt called and stated that she need this med [facility] to [facility] (phone # [phone]). Please check and call her back

## 2016-07-10 NOTE — Telephone Encounter (Signed)
Called pt and informed her that medication has been sent to the [facility] mail order.

## 2016-07-10 NOTE — Telephone Encounter (Signed)
Error

## 2016-07-11 ENCOUNTER — Telehealth: Payer: Self-pay | Admitting: Internal Medicine

## 2016-07-11 MED ORDER — HYDROCODONE-ACETAMINOPHEN 5-325 MG PO TABS: 1.0000 | ORAL_TABLET | Freq: Four times a day (QID) | ORAL | 0 refills | Status: DC | PRN

## 2016-07-11 NOTE — Telephone Encounter (Signed)
Done hardcopy to [place]  

## 2016-07-11 NOTE — Telephone Encounter (Signed)
Pt called wanting refill on hydrocodone.

## 2016-07-15 NOTE — Telephone Encounter (Signed)
Called pt, script at front desk.

## 2016-07-23 ENCOUNTER — Other Ambulatory Visit: Payer: Self-pay | Admitting: Internal Medicine

## 2016-07-25 ENCOUNTER — Other Ambulatory Visit: Payer: Self-pay | Admitting: *Deleted

## 2016-07-25 DIAGNOSIS — E876 Hypokalemia: Secondary | ICD-10-CM

## 2016-07-25 DIAGNOSIS — C20 Malignant neoplasm of rectum: Secondary | ICD-10-CM

## 2016-07-25 MED ORDER — POTASSIUM CHLORIDE CRYS ER 20 MEQ PO TBCR: 20.0000 meq | EXTENDED_RELEASE_TABLET | Freq: Every day | ORAL | 1 refills | Status: DC

## 2016-07-31 ENCOUNTER — Telehealth: Payer: Self-pay | Admitting: *Deleted

## 2016-08-01 NOTE — Telephone Encounter (Signed)
Spoke with pt regarding Klor-con refill. Pt has received it through mail order and did not need it filled at local pharmacy.

## 2016-08-15 ENCOUNTER — Other Ambulatory Visit: Payer: Self-pay | Admitting: Internal Medicine

## 2016-08-28 ENCOUNTER — Telehealth: Payer: Self-pay | Admitting: *Deleted

## 2016-08-28 MED ORDER — HYDROCODONE-ACETAMINOPHEN 5-325 MG PO TABS: 1.0000 | ORAL_TABLET | Freq: Four times a day (QID) | ORAL | 0 refills | Status: DC | PRN

## 2016-08-28 NOTE — Telephone Encounter (Signed)
Done hardcopy to [place]  

## 2016-08-28 NOTE — Telephone Encounter (Signed)
Left msg on triage requesting refill on her hydrocodone.../lmb 

## 2016-08-29 NOTE — Telephone Encounter (Signed)
Called pt, informed pt that script was ready for pick up. [place] desk.

## 2016-09-20 ENCOUNTER — Encounter: Payer: 59 | Admitting: Internal Medicine

## 2016-09-26 ENCOUNTER — Telehealth: Payer: Self-pay | Admitting: *Deleted

## 2016-09-26 MED ORDER — HYDROCODONE-ACETAMINOPHEN 5-325 MG PO TABS: 1.0000 | ORAL_TABLET | Freq: Four times a day (QID) | ORAL | 0 refills | Status: DC | PRN

## 2016-09-26 NOTE — Telephone Encounter (Signed)
Done hardcopy to [place]  

## 2016-09-26 NOTE — Telephone Encounter (Signed)
Pt left msg on triage requesting refill on Hydrocodone.../lmb 

## 2016-09-27 NOTE — Telephone Encounter (Signed)
Called pt, LVM Script at front desk

## 2016-10-04 DIAGNOSIS — Z85048 Personal history of other malignant neoplasm of rectum, rectosigmoid junction, and anus: Secondary | ICD-10-CM | POA: Diagnosis not present

## 2016-10-11 ENCOUNTER — Other Ambulatory Visit: Payer: 59

## 2016-10-17 ENCOUNTER — Other Ambulatory Visit: Payer: 59

## 2016-10-18 ENCOUNTER — Other Ambulatory Visit (HOSPITAL_BASED_OUTPATIENT_CLINIC_OR_DEPARTMENT_OTHER): Payer: 59

## 2016-10-18 ENCOUNTER — Ambulatory Visit
Admission: RE | Admit: 2016-10-18 | Discharge: 2016-10-18 | Disposition: A | Discharge status: Home / Self Care | Payer: 59 | Source: Ambulatory Visit | Attending: Hematology | Admitting: Hematology

## 2016-10-18 DIAGNOSIS — C20 Malignant neoplasm of rectum: Secondary | ICD-10-CM

## 2016-10-18 DIAGNOSIS — D500 Iron deficiency anemia secondary to blood loss (chronic): Secondary | ICD-10-CM | POA: Diagnosis not present

## 2016-10-18 DIAGNOSIS — D509 Iron deficiency anemia, unspecified: Secondary | ICD-10-CM

## 2016-10-18 LAB — CBC WITH DIFFERENTIAL/PLATELET
BASO%: 0.2 % (ref 0.0–2.0)
Basophils Absolute: 0 10*3/uL (ref 0.0–0.1)
EOS%: 0.1 % (ref 0.0–7.0)
Eosinophils Absolute: 0 10*3/uL (ref 0.0–0.5)
HCT: 37.5 % (ref 34.8–46.6)
HGB: 12.3 g/dL (ref 11.6–15.9)
LYMPH%: 12.6 % — AB (ref 14.0–49.7)
MCH: 28.1 pg (ref 25.1–34.0)
MCHC: 32.9 g/dL (ref 31.5–36.0)
MCV: 85.2 fL (ref 79.5–101.0)
MONO#: 0.4 10*3/uL (ref 0.1–0.9)
MONO%: 6.3 % (ref 0.0–14.0)
NEUT%: 80.8 % — AB (ref 38.4–76.8)
NEUTROS ABS: 4.6 10*3/uL (ref 1.5–6.5)
Platelets: 273 10*3/uL (ref 145–400)
RBC: 4.4 10*6/uL (ref 3.70–5.45)
RDW: 13.6 % (ref 11.2–14.5)
WBC: 5.6 10*3/uL (ref 3.9–10.3)
lymph#: 0.7 10*3/uL — ABNORMAL LOW (ref 0.9–3.3)

## 2016-10-18 LAB — COMPREHENSIVE METABOLIC PANEL
ALT: 16 U/L (ref 0–55)
AST: 14 U/L (ref 5–34)
Albumin: 4.1 g/dL (ref 3.5–5.0)
Alkaline Phosphatase: 74 U/L (ref 40–150)
Anion Gap: 9 mEq/L (ref 3–11)
BUN: 11.2 mg/dL (ref 7.0–26.0)
CHLORIDE: 103 meq/L (ref 98–109)
CO2: 26 meq/L (ref 22–29)
CREATININE: 0.7 mg/dL (ref 0.6–1.1)
Calcium: 10.2 mg/dL (ref 8.4–10.4)
EGFR: >90 mL/min/{1.73_m2} (ref >90)
GLUCOSE: 101 mg/dL (ref 70–140)
Potassium: 3.1 mEq/L — ABNORMAL LOW (ref 3.5–5.1)
SODIUM: 139 meq/L (ref 136–145)
Total Bilirubin: 1.06 mg/dL (ref 0.20–1.20)
Total Protein: 7.4 g/dL (ref 6.4–8.3)

## 2016-10-18 LAB — CEA (IN HOUSE-CHCC): CEA (CHCC-IN HOUSE): 1.71 ng/mL (ref 0.00–5.00)

## 2016-10-18 LAB — IRON AND TIBC
%SAT: 18 % — AB (ref 21–57)
IRON: 76 ug/dL (ref 41–142)
TIBC: 420 ug/dL (ref 236–444)
UIBC: 344 ug/dL (ref 120–384)

## 2016-10-18 LAB — FERRITIN: Ferritin: 72 ng/ml (ref 9–269)

## 2016-10-18 MED ORDER — IOPAMIDOL (ISOVUE-300) INJECTION 61%
100.0000 mL | Freq: Once | INTRAVENOUS | Status: AC | PRN
  Administered 2016-10-18: 100 mL via INTRAVENOUS

## 2016-10-24 NOTE — Progress Notes (Signed)
[place]  [phone] [phone]  Clinic Follow up Note   Patient Care Team: Biagio Borg, MD as PCP - Eber Jones, MD as Consulting Physician (Radiation Oncology) Leighton Ruff, MD as Consulting Physician (General Surgery) Milus Banister, MD as Attending Physician (Gastroenterology) Tania Ade, RN as Registered Nurse 10/25/2016   CHIEF COMPLAINTS:  Follow up rectal cancer  Oncology History   Rectal cancer [facility])   Staging form: Colon and Rectum, AJCC 7th Edition   - Clinical: Stage IIA (T3, N0, M0) - Signed by [name] Merle, MD on 10/10/2015   - Pathologic stage from 01/31/2016: Stage IIA (T3, N0, cM0) - Signed by [name] Merle, MD on 02/22/2016       Rectal cancer ([place])   09/22/2015 Imaging    CT ABD/PELVIS: Soft tissue fullness at the rectosigmoid junction. Otherwise, no acute process in the abdomen or pelvis      09/24/2015 Pathology Results    Adenocarcinoma      09/24/2015 Initial Diagnosis    Rectal cancer ([place])      09/24/2015 Procedure    COLONOSCOPY: Nonobsructive mass in rectosigmoid colon from 10-20 cum, circumferential (Dr. Collene Mares)      09/24/2015 Tumor Marker    CEA=4.1      09/25/2015 Imaging    CT CHEST: Mass within the upper-outer quadrant of the left breast warranting further evaluation. No suspicious pulmonary abnormality      10/25/2015 - 12/01/2015 Radiation Therapy    adjuvant irradiation to rectal cancer      10/25/2015 - 12/01/2015 Chemotherapy    Xeloda 1500 mg twice daily, with concurrent irradiation      01/31/2016 Surgery    Robot assisted low anterior resection of rectal cancer       01/31/2016 Pathology Results    Invasive colorectal adenocarcinoma, 3.5cm, G2, LVI(-), peri-neural invasion (-), ypT3, margins negative, 17 nodes all negative       03/11/2016 - 06/14/2016 Chemotherapy    Adjuvant Xeloda 2000 mg in a.m., 1500 mg a evening, 2 weeks on and one-week off, a total 4-5 cycles.         10/18/2016 Imaging    CT CAP 10/18/16 IMPRESSION: Status post low anterior resection with radiation changes in the presacral region.  No findings suspicious for recurrent or metastatic disease.     ' HISTORY OF PRESENTING ILLNESS:  Karen Good 61 y.o. female is here because of her newly diagnosed rectal cancer. She presents to my clinic with her husband.  She had had intermittent bloody stool for one month, somewhat mount, mixed with stool, with worsening fatigue, and 6 lbs weight loss, no significant abdominal pain, nausea, dyspnea, or other symptoms. She was seen by her PCP Dr. Jenny Reichmann and lab work showed anemia. She was sent to ED on 6/9 and received blood transfusion for Hb 6.9. She underwent a colonoscopy which showed a partially obstructive proximal rectal mass, biopsy showed adenocarcinoma. She was subsequently referred to Dr. Ardis Hughs and underwent EUS which showed a T3 N0 rectal mass. She was seen by [facility] Dr. Marcello Moores last week, and is also scheduled to see radiation oncologist Dr. Lisbeth Renshaw tomorrow.  She has felt much better overall after blood transfusion. She did receive IV ferric gluconate 125 mg in the hospital, but is not on oral iron supplements. She feels well overall, has good appetite and energy level, but does feel anxious since her cancer diagnosis.  CURRENT THERAPY: Surveillance  INTERIM HISTORY:  Karen Good returns  for follow-up. She presents to the clinci today with her husband. She reports to feeling better being off chemo. She reports nothing new since last visit.  She is back to work and she has been back to her normal activities.  Dr. Marcello Moores will do her colonoscopy in October.  She denies nausea, pain or bloating.  She is still taking Potassium '20mg'$  once a day.  Her BM are regular but certain things she eats can cause diarrhea.    MEDICAL HISTORY:  Past Medical History:  Diagnosis Date  . Anemia   . BACK PAIN 04/13/2010  . BACK STRAIN, LUMBAR 04/13/2010  .  Cancer ([facility])    rectal  . DIABETES MELLITUS, TYPE II 06/02/2008  . History of blood transfusion   . History of chemotherapy   . History of radiation therapy   . HYPERLIPIDEMIA 06/02/2008  . HYPERTENSION 06/02/2008    SURGICAL HISTORY: Past Surgical History:  Procedure Laterality Date  . COLONOSCOPY Left 09/24/2015   Procedure: COLONOSCOPY;  Co: Juanita Craver, MD;  Location: [facility] ENDOSCOPY;  Service: Endoscopy;  Laterality: Left;  . ESOPHAGOGASTRODUODENOSCOPY Good/A 09/24/2015   Procedure: ESOPHAGOGASTRODUODENOSCOPY (EGD);  [place]: Juanita Craver, MD;  Location: [facility] ENDOSCOPY;  Service: Endoscopy;  Laterality: Good/A;  . EUS Good/A 10/05/2015   Procedure: LOWER ENDOSCOPIC ULTRASOUND (EUS);  [place]: Milus Banister, MD;  Location: [facility] ENDOSCOPY;  Service: Endoscopy;  Laterality: Good/A;  . s/p breast biopsy  4/07   benign  . TUBAL LIGATION      SOCIAL HISTORY: Social History   Social History  . Marital status: Single    Spouse name: Good/A  . Number of children: 2  . Years of education: Good/A   Occupational History  .  [employer]   Social History Main Topics  . Smoking status: Never Smoker  . Smokeless tobacco: Never Used  . Alcohol use Yes     Comment: socially  . Drug use: No  . Sexual activity: Yes    Birth control/ protection: Post-menopausal   Other Topics Concern  . Not on file   Social History Narrative  . No narrative on file    FAMILY HISTORY: Family History  Problem Relation Age of Onset  . Diabetes Mother   . Heart disease Father   . Diabetes Father   . Hypertension Father   . Diabetes Sister   . Diabetes Other   . Diabetes Brother     ALLERGIES:  has No Known Allergies.  MEDICATIONS:  Current Outpatient Prescriptions  Medication Sig Dispense Refill  . amLODipine (NORVASC) 10 MG tablet Take 1 tablet (10 mg total) by mouth daily. 30 tablet 10  . atenolol (TENORMIN) 25 MG tablet Take 0.5 tablets (12.5 mg total) by mouth 2 (two) times daily. 60 tablet 6  .  atorvastatin (LIPITOR) 20 MG tablet Take 1 tablet (20 mg total) by mouth daily. (Patient taking differently: Take 20 mg by mouth daily at 6 PM. ) 90 tablet 3  . atorvastatin (LIPITOR) 20 MG tablet TAKE 1 TABLET (20 MG TOTAL) BY MOUTH DAILY. 90 tablet 2  . Blood Glucose Monitoring Suppl (ONE TOUCH ULTRA 2) w/Device KIT Use as directed daily 1 each 0  . glipiZIDE (GLUCOTROL XL) 5 MG 24 hr tablet TAKE TWO TABLETS BY MOUTH DAILY 180 tablet 1  . glucose blood (ONE TOUCH ULTRA TEST) test strip Use as instructed once daily 100 each 12  . hydrochlorothiazide (HYDRODIURIL) 25 MG tablet Take 1 tablet (25 mg total) by mouth  daily. **PT NEEDS APPT FOR ADDITIONAL REFILLS** 90 tablet 1  . HYDROcodone-acetaminophen (NORCO) 5-325 MG tablet Take 1 tablet by mouth every 6 (six) hours as needed for moderate pain. To last 30 days 30 tablet 0  . ibuprofen (ADVIL,MOTRIN) 200 MG tablet Take 400 mg by mouth every 6 (six) hours as needed for mild pain.    Marland Kitchen irbesartan (AVAPRO) 300 MG tablet Take 1 tablet (300 mg total) by mouth at bedtime. 90 tablet 3  . irbesartan (AVAPRO) 300 MG tablet TAKE 1 TABLET (300 MG TOTAL) BY MOUTH AT BEDTIME. 90 tablet 2  . Lancets Misc. MISC Use as directed once daily 100 each 11  . pioglitazone (ACTOS) 45 MG tablet TAKE 1 TABLET (45 MG TOTAL) BY MOUTH DAILY. 90 tablet 2  . potassium chloride SA (KLOR-CON M20) 20 MEQ tablet Take 1 tablet (20 mEq total) by mouth daily. 60 tablet 1  . sitaGLIPtin-metformin (JANUMET) 50-1000 MG tablet Take 1 tablet by mouth 2 (two) times daily. 180 tablet 3   No current facility-administered medications for this visit.     REVIEW OF SYSTEMS:  Constitutional: Denies fevers, chills or abnormal night sweats Eyes: Denies blurriness of vision, double vision or watery eyes Ears, nose, mouth, throat, and face: Denies mucositis or sore throat Respiratory: Denies cough, dyspnea or wheezes Cardiovascular: Denies palpitation, chest discomfort or lower extremity  swelling Gastrointestinal:  Denies nausea, heartburn or change in bowel habits Skin: Denies abnormal skin rashes (+) Dry skin Lymphatics: Denies new lymphadenopathy or easy bruising Neurological:Denies numbness, tingling or new weaknesses Behavioral/Psych: Mood is stable, no new changes  All other systems were reviewed with the patient and are negative.   PHYSICAL EXAMINATION: ECOG PERFORMANCE STATUS: 0 - Asymptomatic  Vitals:   10/25/16 1421  BP: (!) 182/71  Pulse: (!) 113  Resp: 18  Temp: 98.3 F (36.8 C)   Filed Weights   10/25/16 1421  Weight: 174 lb 11.2 oz (79.2 kg)     GENERAL:alert, no distress and comfortable SKIN: skin color, texture, turgor are normal, no rashes or significant lesions EYES: normal, conjunctiva are pink and non-injected, sclera clear OROPHARYNX:no exudate, no erythema and lips, buccal mucosa, and tongue normal  NECK: supple, thyroid normal size, non-tender, without nodularity LYMPH:  no palpable lymphadenopathy in the cervical, axillary or inguinal LUNGS: clear to auscultation and percussion with normal breathing effort HEART: regular rate & rhythm, 2/6 systolic murmurs, no lower extremity edema ABDOMEN:abdomen soft, non-tender and normal bowel sounds, no organomegaly. Surgical incisions have healed well. Rectal exam was not performed. Musculoskeletal:no cyanosis of digits and no clubbing  PSYCH: alert & oriented x 3 with fluent speech NEURO: no focal motor/sensory deficits Breast exam: deferred  Rectal exam: normal   LABORATORY DATA:  I have reviewed the data as listed CBC Latest Ref Rng & Units 10/18/2016 06/28/2016 05/31/2016  WBC 3.9 - 10.3 10e3/uL 5.6 4.5 4.1  Hemoglobin 11.6 - 15.9 g/dL 12.3 11.4(L) 10.7(L)  Hematocrit 34.8 - 46.6 % 37.5 35.1 32.9(L)  Platelets 145 - 400 10e3/uL 273 250 271   CMP Latest Ref Rng & Units 10/18/2016 06/28/2016 05/31/2016  Glucose 70 - 140 mg/dl 101 86 79  BUN 7.0 - 26.0 mg/dL 11.2 17.6 15.1  Creatinine 0.6 -  1.1 mg/dL 0.7 0.7 0.7  Sodium 136 - 145 mEq/L 139 142 142  Potassium 3.5 - 5.1 mEq/L 3.1(L) 3.4(L) 3.2(L)  Chloride 96 - 112 mEq/L - - -  CO2 22 - 29 mEq/L '26 24 24  '$ Calcium  8.4 - 10.4 mg/dL 10.2 10.0 9.7  Total Protein 6.4 - 8.3 g/dL 7.4 7.7 7.3  Total Bilirubin 0.20 - 1.20 mg/dL 1.06 1.41(H) 1.27(H)  Alkaline Phos 40 - 150 U/L 74 83 77  AST 5 - 34 U/L '14 21 16  '$ ALT 0 - 55 U/L '16 29 21    '$ Results for KAMEISHA, MALICKI (MRN [id number]) as of 10/24/2016 11:52  Ref. Range 02/22/2016 15:10 05/31/2016 14:44 10/18/2016 14:24  CEA ([facility]) Latest Ref Range: 0.00 - 5.00 ng/mL 1.05 1.19 1.71    PATHOLOGY REPORT  Diagnosis 09/24/2015 Colon, biopsy, Rectal sigmoid mass - ADENOCARCINOMA. - SEE COMMENT. Microscopic Comment Dr. Orene Desanctis has reviewed the case and concurs with this interpretation. Dr. Collene Mares was paged on 09/26/15. (JBK:gt, 09/26/15)  Diagnosis 01/31/2016 Colon, segmental resection for tumor, rectosigmoid - INVASIVE COLORECTAL ADENOCARCINOMA, 3.5 CM EXTENDING INTO PERIRECTAL CONNECTIVE TISSUE. - MARGINS NOT INVOLVED. - SEVENTEEN BENIGN LYMPH NODE (0/17). Microscopic Comment COLON AND RECTUM (INCLUDING TRANS-ANAL RESECTION): Specimen: Rectosigmoid colon. Procedure: Resection. Tumor site: Proximal rectum. Specimen integrity: Intact. Macroscopic intactness of mesorectum: Complete. Macroscopic tumor perforation: No. Invasive tumor: Maximum size: 3.5 cm. Histologic type(s): Colorectal adenocarcinoma. Histologic grade and differentiation: G2: moderately differentiated/low grade Type of polyp in which invasive carcinoma arose: No residual polyp. Microscopic extension of invasive tumor: Into perirectal connective tissue. Lymph-Vascular invasion: Not identified. Peri-neural invasion: No. Tumor deposit(s) (discontinuous extramural extension): No Resection margins: Proximal margin: Free of tumor. Distal margin: Free of tumor. Circumferential (radial) (posterior ascending, posterior  descending; lateral and posterior mid-rectum; and entire lower 1/3 rectum): Free of tumor. Mesenteric margin (sigmoid and transverse): Good/A 1 of 5 Amended copy Amended FINAL for Staff, Karen Good [phone]) Microscopic Comment(continued) Distance closest margin (if all above margins negative): 3 cm from circumferential radial margin. Treatment effect (neo-adjuvant therapy): Yes. Additional polyp(s): Tubular adenoma with high grade dysplasia. Non-neoplastic findings: Good/A Lymph nodes: number examined - 17; number positive: 0 Pathologic Staging: ypT3, ypN0, ypMX Ancillary studies: Microsatellite instability by PCR and mismatch repair protein by immunohistochemistry. (JDP:gt, 02/01/16)  PROCEDURES EUS 10/05/2015 Dr. Ardis Hughs  IMPRESSION:  Partially circumferential, 6cm long uT3N0 rectosigmoid adenocarcinoma with distal edge located 9cm from the anal verge. Submucosal injections of [place]k, at proximal and distal edges of the mass.  Colonoscopy and EGD 09/24/2015 Dr. Collene Mares  - Large circumferental polypoid mass in the rectosigmoid colon from 10-20 cm-biopsies Done. - Normal appearing, widely patent esophagus and GEJ. - Small hiatal hernia noted on retroflexion. - Mild antral gastritis-otherwise normal appearing stomach. - Normal examined duodenum. - No specimens collected.   RADIOGRAPHIC STUDIES: I have personally reviewed the radiological images as listed and agreed with the findings in the report. Ct Chest W Contrast  Result Date: 10/18/2016 CLINICAL DATA:  Rectal cancer, status post resection, follow-up EXAM: CT CHEST, ABDOMEN, AND PELVIS WITH CONTRAST TECHNIQUE: Multidetector CT imaging of the chest, abdomen and pelvis was performed following the standard protocol during bolus administration of intravenous contrast. CONTRAST:  187m ISOVUE-300 IOPAMIDOL (ISOVUE-300) INJECTION 61% [facility] [address] Results: Creatinine 0.5  mg/dL. COMPARISON:  CT chest dated 09/25/2015. CT abdomen/ pelvis dated 09/22/2015. FINDINGS: CT CHEST FINDINGS Cardiovascular: Heart is normal in size.  No pericardial effusion. No evidence of thoracic aortic aneurysm. Atherosclerotic calcifications of the aortic arch. Mediastinum/Nodes: No suspicious mediastinal, hilar, or axillary lymphadenopathy. Visualized thyroid is unremarkable. Lungs/Pleura: Lungs are clear. No suspicious pulmonary nodules.  No focal consolidation. No pleural effusion or pneumothorax. Musculoskeletal:  Mild degenerative changes of the lower thoracic spine. CT ABDOMEN PELVIS FINDINGS Hepatobiliary: Liver is within normal limits. No suspicious enhancing hepatic lesions. Gallbladder is unremarkable. No intrahepatic or extrahepatic ductal dilatation. Pancreas: Within normal limits. Spleen: Within normal limits. Adrenals/Urinary Tract: Adrenal glands within normal limits. Kidneys are within normal limits.  No hydronephrosis. Bladder is within normal limits. Stomach/Bowel: Stomach is within normal limits. No evidence of bowel obstruction. Normal appendix (coronal image 46). Status post low anterior resection with anastomosis in the lower pelvis (series 3/ image 107). Vascular/Lymphatic: No evidence of abdominal aortic aneurysm. Atherosclerotic calcifications of the abdominal aorta and branch vessels. No suspicious abdominopelvic lymphadenopathy. Reproductive: Heterogeneous appearance of the uterus is likely related to uterine fibroids, including a dominant 3.4 cm subserosal fibroid along the left uterine body (series 3/image 98. Right ovary is within normal limits. Left ovary is poorly visualized. Other: No abdominopelvic ascites. Mild presacral soft tissue stranding (series 3/ image 103), likely reflecting radiation changes. Musculoskeletal: Visualized osseous structures are within normal limits. IMPRESSION: Status post low anterior resection with radiation changes in the presacral region. No  findings suspicious for recurrent or metastatic disease. Electronically Signed   By: Julian Hy M.D.   On: 10/18/2016 11:05   Ct Abdomen Pelvis W Contrast  Result Date: 10/18/2016 CLINICAL DATA:  Rectal cancer, status post resection, follow-up EXAM: CT CHEST, ABDOMEN, AND PELVIS WITH CONTRAST TECHNIQUE: Multidetector CT imaging of the chest, abdomen and pelvis was performed following the standard protocol during bolus administration of intravenous contrast. CONTRAST:  153m ISOVUE-300 IOPAMIDOL (ISOVUE-300) INJECTION 61% [facility] [address] Results: Creatinine 0.5 mg/dL. COMPARISON:  CT chest dated 09/25/2015. CT abdomen/ pelvis dated 09/22/2015. FINDINGS: CT CHEST FINDINGS Cardiovascular: Heart is normal in size.  No pericardial effusion. No evidence of thoracic aortic aneurysm. Atherosclerotic calcifications of the aortic arch. Mediastinum/Nodes: No suspicious mediastinal, hilar, or axillary lymphadenopathy. Visualized thyroid is unremarkable. Lungs/Pleura: Lungs are clear. No suspicious pulmonary nodules.  No focal consolidation. No pleural effusion or pneumothorax. Musculoskeletal: Mild degenerative changes of the lower thoracic spine. CT ABDOMEN PELVIS FINDINGS Hepatobiliary: Liver is within normal limits. No suspicious enhancing hepatic lesions. Gallbladder is unremarkable. No intrahepatic or extrahepatic ductal dilatation. Pancreas: Within normal limits. Spleen: Within normal limits. Adrenals/Urinary Tract: Adrenal glands within normal limits. Kidneys are within normal limits.  No hydronephrosis. Bladder is within normal limits. Stomach/Bowel: Stomach is within normal limits. No evidence of bowel obstruction. Normal appendix (coronal image 46). Status post low anterior resection with anastomosis in the lower pelvis (series 3/ image 107). Vascular/Lymphatic: No evidence of abdominal aortic aneurysm. Atherosclerotic calcifications of the  abdominal aorta and branch vessels. No suspicious abdominopelvic lymphadenopathy. Reproductive: Heterogeneous appearance of the uterus is likely related to uterine fibroids, including a dominant 3.4 cm subserosal fibroid along the left uterine body (series 3/image 98. Right ovary is within normal limits. Left ovary is poorly visualized. Other: No abdominopelvic ascites. Mild presacral soft tissue stranding (series 3/ image 103), likely reflecting radiation changes. Musculoskeletal: Visualized osseous structures are within normal limits. IMPRESSION: Status post low anterior resection with radiation changes in the presacral region. No findings suspicious for recurrent or metastatic disease. Electronically Signed   By: SJulian HyM.D.   On: 10/18/2016 11:05     Diagnostic mammogram of left breast 12/12/2015 IMPRESSION: The asymmetry in the upper-outer quadrant of the left breast corresponds with the finding on the patient's CT. This has been stable well over 2 years (since  2011), consistent with a benign finding. RECOMMENDATION: Screening mammogram in one year.(Code:SM-B-01Y) I have discussed the findings and recommendations with the patient. Results were also provided in writing at the conclusion of the visit. If applicable, a reminder letter will be sent to the patient regarding the next appointment. BI-RADS CATEGORY  2: Benign.    ASSESSMENT & PLAN: 61 y.o. female with past medical history of diabetes and hypertension, presented with rectal bleeding. Colonoscopy showed a large proximal rectal mass.  1. Rectal adenocarcinoma, proximal rectum, uT3N0M0, stage IIA, ypT3N0M0  -I have previously reviewed her colonoscopy, EUS, and CT of abdomen pelvis finding with pt in details -The EUS showed a T3 lesion, no suspicious lymphadenopathy. The CT scan showed no evidence of adenopathy or distant metastasis. She has stage IIA disease -We previously reviewed the natural history of rectal cancer  and risk of recurrence after surgery. Given the stage II a disease, she likely has moderate risk of recurrence. We discussed the treatment option for stage IIA rectal cancer. The standard of care is neoadjuvant chemotherapy and radiation, followed by surgery, then adjuvant chemotherapy. -She completed neoadjuvant chemoradiation, tolerated it very well -I previously discussed her surgical pathology findings with patient and her husband in details, she had minimal response to neoadjuvant chemotherapy and radiation, still has residual T3 disease, no lymph node metastasis. -We previously discussed the role of adjuvant chemotherapy in rectal cancer, given the significant residual disease, I recommended adjuvant Xeloda '2000mg'$ /m2 in the morning and '1500mg'$ /m2 in the evening for 14 days, then 7 days off, for 5 cycles. -she has completed adjuvant Xeloda  -we discussed her surveillance CT scan from 10/18/2016, which showed no evidence of recurrence. -She is clinically doing well, exam is unremarkable. Lab reviewed. Her anemia has resolved. No clinical concern for recurrence. We'll continue surveillance -we again discussed surveillance and will f/u with her in 6 months and her next colonoscopy is 01/2017 and another scan in 1 year.  -She will also follow-up with Dr. Marcello Moores -I encouraged her to exercise, walk and go to the gym 1-2 times a week.    2. Iron deficient anemia secondary to GI bleeding -Her iron studies on 09/22/15 and 10/11/15 were consistent with iron deficient anemia. -She has received blood transfusion and iv feraheme twice  -Repeat study has been normal -continue oral ferrous sulfate 1 tablet a day -anemia resolved now.   3. Anxiety  -Secondary to cancer diagnosis. -Improved lately.  4. HTN and DM -Continue medication. She'll follow-up with her primary care physician -I again discussed the impact of chemotherapy on her blood pressure and diabetes, we'll monitor her blood pressure and glucose  level closely, and is just the medication if needed. -Her blood pressure has been high lately, I encouraged her to monitor at home and follow-up with her primary care physician  5. Left breast mass -Previous CT chest revealed a left breast mass, however her screening mammogram was negative in 09/2015 -Her diagnostic mammogram showed the left breast lesion has been stable for a few years, likely benign, no biopsy was recommended. -Continue annual screening mammogram, she is due next month  6. Hypokalemia  -mild and stable, K 3.4 previously. She will continue to take a potassium supplement, and eat potassium rich food  -I encourage her to continue potassium due to her continuous low levels. I encouraged her to eat bananas and nuts.   7. Hyperbilirubinemia - likely related to Xeloda. -resolved now   PLAN -Continue cancer surveillance  -Colonoscopy in 01/2017 -labs  and f/u in 6 months  -Next scan in 1 year -She is due for mammogram next month  All questions were answered. The patient knows to call the clinic with any problems, questions or concerns.  I spent 20 minutes counseling the patient face to face. The total time spent in the appointment was 25 minutes and more than 50% was on counseling.  This document serves as a record of services personally performed by [name] Merle, MD. It was created on her behalf by Karen Good, a trained medical scribe. The creation of this record is based on the scribe's personal observations and the provider's statements to them. This document has been checked and approved by the attending provider.   I have reviewed the above documentation for accuracy and completeness and I agree with the above.   [name] Merle, MD 10/25/2016

## 2016-10-25 ENCOUNTER — Other Ambulatory Visit: Payer: Self-pay | Admitting: Internal Medicine

## 2016-10-25 ENCOUNTER — Telehealth: Payer: Self-pay | Admitting: Hematology

## 2016-10-25 ENCOUNTER — Telehealth: Payer: Self-pay | Admitting: *Deleted

## 2016-10-25 ENCOUNTER — Ambulatory Visit (HOSPITAL_BASED_OUTPATIENT_CLINIC_OR_DEPARTMENT_OTHER): Payer: 59 | Admitting: Hematology

## 2016-10-25 VITALS — BP 182/71 | HR 113 | Temp 98.3°F | Resp 18 | Ht 65.0 in | Wt 174.7 lb

## 2016-10-25 DIAGNOSIS — E119 Type 2 diabetes mellitus without complications: Secondary | ICD-10-CM | POA: Diagnosis not present

## 2016-10-25 DIAGNOSIS — F419 Anxiety disorder, unspecified: Secondary | ICD-10-CM

## 2016-10-25 DIAGNOSIS — C20 Malignant neoplasm of rectum: Secondary | ICD-10-CM | POA: Diagnosis not present

## 2016-10-25 DIAGNOSIS — N63 Unspecified lump in unspecified breast: Secondary | ICD-10-CM

## 2016-10-25 DIAGNOSIS — I10 Essential (primary) hypertension: Secondary | ICD-10-CM | POA: Diagnosis not present

## 2016-10-25 DIAGNOSIS — E876 Hypokalemia: Secondary | ICD-10-CM

## 2016-10-25 DIAGNOSIS — D500 Iron deficiency anemia secondary to blood loss (chronic): Secondary | ICD-10-CM

## 2016-10-25 NOTE — Telephone Encounter (Signed)
Spoke with pt and husband in scheduling today.  Per pt, she is taking Kdur 20 meq daily.  Instructed pt to take an extra  20 meq Kdur daily - total of 40 meq daily for 1 - 2 weeks, then go back to 20 meq daily.  Gave pt recommendations on dietary foods  high in potassium. Both pt and husband voiced understanding.

## 2016-10-25 NOTE — Telephone Encounter (Signed)
Gave patient avs report and appointments for January 2019.  °

## 2016-10-27 ENCOUNTER — Encounter: Payer: Self-pay | Admitting: Hematology

## 2016-11-05 ENCOUNTER — Other Ambulatory Visit: Payer: Self-pay | Admitting: Hematology

## 2016-11-05 DIAGNOSIS — E876 Hypokalemia: Secondary | ICD-10-CM

## 2016-11-05 DIAGNOSIS — C20 Malignant neoplasm of rectum: Secondary | ICD-10-CM

## 2016-11-26 ENCOUNTER — Telehealth: Payer: Self-pay | Admitting: Internal Medicine

## 2016-11-26 MED ORDER — HYDROCODONE-ACETAMINOPHEN 5-325 MG PO TABS: 1.0000 | ORAL_TABLET | Freq: Four times a day (QID) | ORAL | 0 refills | Status: DC | PRN

## 2016-11-26 NOTE — Telephone Encounter (Signed)
Check [place] registry last filled 09/27/2016 30 day script...lmb

## 2016-11-26 NOTE — Telephone Encounter (Signed)
Done hardcopy to [place]  

## 2016-11-26 NOTE — Telephone Encounter (Signed)
Pt called requesting a refill on her HYDROcodone-acetaminophen (NORCO) 5-325 MG tablet.

## 2016-11-27 NOTE — Telephone Encounter (Signed)
Pt has been informed Script at front desk  

## 2016-12-14 ENCOUNTER — Other Ambulatory Visit: Payer: Self-pay | Admitting: Internal Medicine

## 2017-01-10 ENCOUNTER — Other Ambulatory Visit: Payer: Self-pay | Admitting: Student in an Organized Health Care Education/Training Program

## 2017-01-10 ENCOUNTER — Other Ambulatory Visit: Payer: Self-pay | Admitting: Hematology

## 2017-01-10 DIAGNOSIS — Z1231 Encounter for screening mammogram for malignant neoplasm of breast: Secondary | ICD-10-CM

## 2017-01-28 ENCOUNTER — Telehealth: Payer: Self-pay | Admitting: Internal Medicine

## 2017-01-31 ENCOUNTER — Ambulatory Visit
Admission: RE | Admit: 2017-01-31 | Discharge: 2017-01-31 | Disposition: A | Discharge status: Home / Self Care | Payer: 59 | Source: Ambulatory Visit | Attending: Hematology | Admitting: Hematology

## 2017-01-31 DIAGNOSIS — Z1231 Encounter for screening mammogram for malignant neoplasm of breast: Secondary | ICD-10-CM

## 2017-02-07 MED ORDER — AMLODIPINE BESYLATE 10 MG PO TABS: 10.0000 mg | ORAL_TABLET | Freq: Every day | ORAL | 2 refills | Status: DC

## 2017-02-07 NOTE — Telephone Encounter (Signed)
The pharmacy did not receive this refill. Can it be resent?

## 2017-02-07 NOTE — Addendum Note (Signed)
Addended by: Juliet Rude on: 02/07/2017 04:22 PM   Modules accepted: Orders

## 2017-02-10 DIAGNOSIS — Z85048 Personal history of other malignant neoplasm of rectum, rectosigmoid junction, and anus: Secondary | ICD-10-CM | POA: Diagnosis not present

## 2017-02-17 ENCOUNTER — Encounter: Payer: Self-pay | Admitting: Gastroenterology

## 2017-02-24 ENCOUNTER — Encounter: Payer: Self-pay | Admitting: Gastroenterology

## 2017-03-02 ENCOUNTER — Other Ambulatory Visit: Payer: Self-pay | Admitting: Internal Medicine

## 2017-03-09 ENCOUNTER — Other Ambulatory Visit: Payer: Self-pay | Admitting: Hematology

## 2017-03-09 DIAGNOSIS — C20 Malignant neoplasm of rectum: Secondary | ICD-10-CM

## 2017-03-09 DIAGNOSIS — E876 Hypokalemia: Secondary | ICD-10-CM

## 2017-03-27 ENCOUNTER — Other Ambulatory Visit: Payer: Self-pay | Admitting: Internal Medicine

## 2017-03-31 ENCOUNTER — Other Ambulatory Visit: Payer: Self-pay

## 2017-03-31 ENCOUNTER — Ambulatory Visit (AMBULATORY_SURGERY_CENTER): Payer: Self-pay | Admitting: *Deleted

## 2017-03-31 VITALS — Ht 65.0 in | Wt 184.2 lb

## 2017-03-31 DIAGNOSIS — Z85038 Personal history of other malignant neoplasm of large intestine: Secondary | ICD-10-CM

## 2017-03-31 MED ORDER — SUPREP BOWEL PREP KIT 17.5-3.13-1.6 GM/177ML PO SOLN: 1.0000 | Freq: Once | ORAL | 0 refills | Status: AC

## 2017-03-31 NOTE — Progress Notes (Signed)
Patient denies any allergies to egg or soy products. Patient denies complications with anesthesia/sedation.  Patient denies oxygen use at home and denies diet medications. Colonoscopy pamphlet given to patient.   

## 2017-04-11 ENCOUNTER — Encounter: Payer: 59 | Admitting: Gastroenterology

## 2017-04-25 ENCOUNTER — Telehealth: Payer: Self-pay | Admitting: Hematology

## 2017-04-25 ENCOUNTER — Inpatient Hospital Stay: Payer: 59 | Attending: Hematology | Admitting: Hematology

## 2017-04-25 ENCOUNTER — Encounter: Payer: Self-pay | Admitting: Hematology

## 2017-04-25 ENCOUNTER — Inpatient Hospital Stay: Payer: 59

## 2017-04-25 DIAGNOSIS — D509 Iron deficiency anemia, unspecified: Secondary | ICD-10-CM

## 2017-04-25 DIAGNOSIS — D500 Iron deficiency anemia secondary to blood loss (chronic): Secondary | ICD-10-CM | POA: Insufficient documentation

## 2017-04-25 DIAGNOSIS — F419 Anxiety disorder, unspecified: Secondary | ICD-10-CM | POA: Diagnosis not present

## 2017-04-25 DIAGNOSIS — C20 Malignant neoplasm of rectum: Secondary | ICD-10-CM | POA: Insufficient documentation

## 2017-04-25 DIAGNOSIS — E119 Type 2 diabetes mellitus without complications: Secondary | ICD-10-CM | POA: Diagnosis not present

## 2017-04-25 DIAGNOSIS — I10 Essential (primary) hypertension: Secondary | ICD-10-CM | POA: Diagnosis not present

## 2017-04-25 DIAGNOSIS — F064 Anxiety disorder due to known physiological condition: Secondary | ICD-10-CM

## 2017-04-25 DIAGNOSIS — E876 Hypokalemia: Secondary | ICD-10-CM | POA: Insufficient documentation

## 2017-04-25 LAB — CBC WITH DIFFERENTIAL/PLATELET
BASOS PCT: 0 %
Basophils Absolute: 0 10*3/uL (ref 0.0–0.1)
EOS ABS: 0 10*3/uL (ref 0.0–0.5)
Eosinophils Relative: 0 %
HEMATOCRIT: 36.7 % (ref 34.8–46.6)
HEMOGLOBIN: 11.7 g/dL (ref 11.6–15.9)
LYMPHS ABS: 1.2 10*3/uL (ref 0.9–3.3)
Lymphocytes Relative: 22 %
MCH: 27.1 pg (ref 25.1–34.0)
MCHC: 31.9 g/dL (ref 31.5–36.0)
MCV: 85 fL (ref 79.5–101.0)
MONOS PCT: 6 %
Monocytes Absolute: 0.3 10*3/uL (ref 0.1–0.9)
NEUTROS PCT: 72 %
Neutro Abs: 4 10*3/uL (ref 1.5–6.5)
Platelets: 295 10*3/uL (ref 145–400)
RBC: 4.32 MIL/uL (ref 3.70–5.45)
RDW: 15.5 % (ref 11.2–16.1)
WBC: 5.6 10*3/uL (ref 3.9–10.3)

## 2017-04-25 LAB — COMPREHENSIVE METABOLIC PANEL
ALBUMIN: 4.4 g/dL (ref 3.5–5.0)
ALK PHOS: 85 U/L (ref 40–150)
ALT: 15 U/L (ref 0–55)
ANION GAP: 14 — AB (ref 3–11)
AST: 14 U/L (ref 5–34)
BUN: 14 mg/dL (ref 7–26)
CALCIUM: 10 mg/dL (ref 8.4–10.4)
CO2: 25 mmol/L (ref 22–29)
CREATININE: 0.73 mg/dL (ref 0.60–1.10)
Chloride: 103 mmol/L (ref 98–109)
GFR calc Af Amer: >60 mL/min (ref >60)
GFR calc non Af Amer: >60 mL/min (ref >60)
GLUCOSE: 84 mg/dL (ref 70–140)
Potassium: 3 mmol/L — CL (ref 3.3–4.7)
SODIUM: 142 mmol/L (ref 136–145)
TOTAL PROTEIN: 7.7 g/dL (ref 6.4–8.3)
Total Bilirubin: 1 mg/dL (ref 0.2–1.2)

## 2017-04-25 MED ORDER — POTASSIUM CHLORIDE CRYS ER 20 MEQ PO TBCR: 40.0000 meq | EXTENDED_RELEASE_TABLET | Freq: Every day | ORAL | 2 refills | Status: DC

## 2017-04-25 NOTE — Progress Notes (Signed)
[name]  [phone] [phone]  Clinic Follow up Note   Patient Care Team: Biagio Borg, MD as PCP - Eber Jones, MD as Consulting Physician (Radiation Oncology) Leighton Ruff, MD as Consulting Physician (General Surgery) Milus Banister, MD as Attending Physician (Gastroenterology) Tania Ade, RN as Registered Nurse 04/25/2017   CHIEF COMPLAINTS:  Follow up rectal cancer  Oncology History   Rectal cancer [facility])   Staging form: Colon and Rectum, AJCC 7th Edition   - Clinical: Stage IIA (T3, N0, M0) - Signed by [name] Merle, MD on 10/10/2015   - Pathologic stage from 01/31/2016: Stage IIA (T3, N0, cM0) - Signed by [name] Merle, MD on 02/22/2016       Rectal cancer ([place])   09/22/2015 Imaging    CT ABD/PELVIS: Soft tissue fullness at the rectosigmoid junction. Otherwise, no acute process in the abdomen or pelvis      09/24/2015 Pathology Results    Adenocarcinoma      09/24/2015 Initial Diagnosis    Rectal cancer ([place])      09/24/2015 Procedure    COLONOSCOPY: Nonobsructive mass in rectosigmoid colon from 10-20 cum, circumferential (Dr. Collene Mares)      09/24/2015 Tumor Marker    CEA=4.1      09/25/2015 Imaging    CT CHEST: Mass within the upper-outer quadrant of the left breast warranting further evaluation. No suspicious pulmonary abnormality      10/25/2015 - 12/01/2015 Radiation Therapy    adjuvant irradiation to rectal cancer      10/25/2015 - 12/01/2015 Chemotherapy    Xeloda 1500 mg twice daily, with concurrent irradiation      01/31/2016 Surgery    Robot assisted low anterior resection of rectal cancer       01/31/2016 Pathology Results    Invasive colorectal adenocarcinoma, 3.5cm, G2, LVI(-), peri-neural invasion (-), ypT3, margins negative, 17 nodes all negative       03/11/2016 - 06/14/2016 Chemotherapy    Adjuvant Xeloda 2000 mg in a.m., 1500 mg a evening, 2 weeks on and one-week off, a total 4-5 cycles.         10/18/2016 Imaging    CT CAP 10/18/16 IMPRESSION: Status post low anterior resection with radiation changes in the presacral region.  No findings suspicious for recurrent or metastatic disease.      01/31/2017 Mammogram    IMPRESSION: No mammographic evidence of malignancy. A result letter of this screening mammogram will be mailed directly to the patient.     ' HISTORY OF PRESENTING ILLNESS:  Karen Good 62 y.o. female is here because of her newly diagnosed rectal cancer. She presents to my clinic with her husband.  She had had intermittent bloody stool for one month, somewhat mount, mixed with stool, with worsening fatigue, and 6 lbs weight loss, no significant abdominal pain, nausea, dyspnea, or other symptoms. She was seen by her PCP Dr. Jenny Reichmann and lab work showed anemia. She was sent to ED on 6/9 and received blood transfusion for Hb 6.9. She underwent a colonoscopy which showed a partially obstructive proximal rectal mass, biopsy showed adenocarcinoma. She was subsequently referred to Dr. Ardis Hughs and underwent EUS which showed a T3 N0 rectal mass. She was seen by [place] Dr. Marcello Moores last week, and is also scheduled to see radiation oncologist Dr. Lisbeth Renshaw tomorrow.  She has felt much better overall after blood transfusion. She did receive IV ferric gluconate 125 mg in the hospital, but is not on oral  iron supplements. She feels well overall, has good appetite and energy level, but does feel anxious since her cancer diagnosis.  CURRENT THERAPY: Surveillance  INTERIM HISTORY:  Karen Good returns for follow-up. She presents to the clinic today with her husband. She reports that she is doing well overall. Of note since the patient last visit, she has had a diagnostic mammogram completed on 01/31/17 with results revealing no evidence of malignancy.   Pt checks BP routinely at home with a reading of 140s/80s. Pt's activity includes walking at work.   On review of systems, pt reports  normal bowel movements (2 times a day), good appetite, good energy level. Denies abdominal pain, diarrhea, numbness/tingling in feet or any other complaints at this time. Marland Kitchen     MEDICAL HISTORY:  Past Medical History:  Diagnosis Date  . Anemia   . BACK PAIN 04/13/2010  . BACK STRAIN, LUMBAR 04/13/2010  . Cancer ([facility])    rectal  . DIABETES MELLITUS, TYPE II 06/02/2008  . History of blood transfusion   . History of chemotherapy   . History of radiation therapy   . Hx of transfusion of whole blood 09/2015   prior to colon surgery - [facility]  . HYPERLIPIDEMIA 06/02/2008  . HYPERTENSION 06/02/2008    SURGICAL HISTORY: Past Surgical History:  Procedure Laterality Date  . COLONOSCOPY Left 09/24/2015   Procedure: COLONOSCOPY;  [place]: Juanita Craver, MD;  Location: [facility] ENDOSCOPY;  Service: Endoscopy;  Laterality: Left;  . COLONOSCOPY  09/2015   Dr Collene Mares  . ESOPHAGOGASTRODUODENOSCOPY N/A 09/24/2015   Procedure: ESOPHAGOGASTRODUODENOSCOPY (EGD);  Pri[place]: Juanita Craver, MD;  Location: [facility] ENDOSCOPY;  Service: Endoscopy;  Laterality: N/A;  . EUS N/A 10/05/2015   Procedure: LOWER ENDOSCOPIC ULTRASOUND (EUS);  [place]: Milus Banister, MD;  Location: [facility] ENDOSCOPY;  Service: Endoscopy;  Laterality: N/A;  . s/p breast biopsy Left 07/2005   benign  . TUBAL LIGATION    . TUBAL LIGATION      SOCIAL HISTORY: Social History   Socioeconomic History  . Marital status: Single    Spouse name: Not on file  . Number of children: 2  . Years of education: Not on file  . Highest education level: Not on file  Social Needs  . Financial resource strain: Not on file  . Food insecurity - worry: Not on file  . Food insecurity - inability: Not on file  . Transportation needs - medical: Not on file  . Transportation needs - non-medical: Not on file  Occupational History    Employer: [employer]  Tobacco Use  . Smoking status: Never Smoker  . Smokeless tobacco: Never Used  Substance and Sexual Activity  .  Alcohol use: Yes    Comment: socially  . Drug use: No  . Sexual activity: Yes    Birth control/protection: Post-menopausal  Other Topics Concern  . Not on file  Social History Narrative  . Not on file    FAMILY HISTORY: Family History  Problem Relation Age of Onset  . Diabetes Mother   . Heart disease Father   . Diabetes Father   . Hypertension Father   . Diabetes Sister   . Diabetes Other   . Diabetes Brother   . Colon cancer Neg Hx   . Rectal cancer Neg Hx   . Stomach cancer Neg Hx     ALLERGIES:  has No Known Allergies.  MEDICATIONS:  Current Outpatient Medications  Medication Sig Dispense Refill  . amLODipine (NORVASC) 10  MG tablet Take 1 tablet (10 mg total) by mouth daily. 30 tablet 2  . atenolol (TENORMIN) 25 MG tablet Take 0.5 tablets (12.5 mg total) by mouth 2 (two) times daily. 60 tablet 6  . atorvastatin (LIPITOR) 20 MG tablet Take 1 tablet (20 mg total) by mouth daily. (Patient taking differently: Take 20 mg by mouth daily at 6 PM. ) 90 tablet 3  . Blood Glucose Monitoring Suppl (ONE TOUCH ULTRA 2) w/Device KIT Use as directed daily 1 each 0  . glipiZIDE (GLUCOTROL XL) 5 MG 24 hr tablet TAKE TWO TABLETS BY MOUTH DAILY 60 tablet 1  . glucose blood (ONE TOUCH ULTRA TEST) test strip Use as instructed once daily 100 each 12  . hydrochlorothiazide (HYDRODIURIL) 25 MG tablet Take 1 tablet (25 mg total) by mouth daily. **PT NEEDS APPT FOR ADDITIONAL REFILLS** 90 tablet 1  . HYDROcodone-acetaminophen (NORCO) 5-325 MG tablet Take 1 tablet by mouth every 6 (six) hours as needed for moderate pain. To last 30 days 30 tablet 0  . ibuprofen (ADVIL,MOTRIN) 200 MG tablet Take 400 mg by mouth every 6 (six) hours as needed for mild pain.    Marland Kitchen irbesartan (AVAPRO) 300 MG tablet Take 1 tablet (300 mg total) by mouth at bedtime. 90 tablet 3  . Lancets Misc. MISC Use as directed once daily 100 each 11  . pioglitazone (ACTOS) 45 MG tablet TAKE 1 TABLET (45 MG TOTAL) BY MOUTH DAILY. 90  tablet 2  . potassium chloride SA (KLOR-CON M20) 20 MEQ tablet Take 2 tablets (40 mEq total) by mouth daily. 60 tablet 2  . sitaGLIPtin-metformin (JANUMET) 50-1000 MG tablet Take 1 tablet by mouth 2 (two) times daily. 180 tablet 3   No current facility-administered medications for this visit.     REVIEW OF SYSTEMS:  Constitutional: Denies fevers, chills or abnormal night sweats Eyes: Denies blurriness of vision, double vision or watery eyes Ears, nose, mouth, throat, and face: Denies mucositis or sore throat Respiratory: Denies cough, dyspnea or wheezes Cardiovascular: Denies palpitation, chest discomfort or lower extremity swelling Gastrointestinal:  Denies nausea, heartburn or change in bowel habits Skin: Denies abnormal skin rashes (+) Dry skin Lymphatics: Denies new lymphadenopathy or easy bruising Neurological:Denies numbness, tingling or new weaknesses Behavioral/Psych: Mood is stable, no new changes  All other systems were reviewed with the patient and are negative.   PHYSICAL EXAMINATION: ECOG PERFORMANCE STATUS: 0 - Asymptomatic  Vitals:   04/25/17 1513  BP: (!) 196/82  Pulse: (!) 110  Resp: 18  Temp: 97.8 F (36.6 C)  SpO2: 100%   Filed Weights   04/25/17 1513  Weight: 182 lb 4.8 oz (82.7 kg)     GENERAL:alert, no distress and comfortable SKIN: skin color, texture, turgor are normal, no rashes or significant lesions EYES: normal, conjunctiva are pink and non-injected, sclera clear OROPHARYNX:no exudate, no erythema and lips, buccal mucosa, and tongue normal  NECK: supple, thyroid normal size, non-tender, without nodularity LYMPH:  no palpable lymphadenopathy in the cervical, axillary or inguinal LUNGS: clear to auscultation and percussion with normal breathing effort HEART: regular rate & rhythm, 2/6 systolic murmurs, no lower extremity edema ABDOMEN:abdomen soft, non-tender and normal bowel sounds, no organomegaly. Rectal exam was normal Musculoskeletal:no  cyanosis of digits and no clubbing  PSYCH: alert & oriented x 3 with fluent speech NEURO: no focal motor/sensory deficits Rectal exam: normal   LABORATORY DATA:  I have reviewed the data as listed CBC Latest Ref Rng & Units 04/25/2017 10/18/2016 06/28/2016  WBC 3.9 - 10.3 K/uL 5.6 5.6 4.5  Hemoglobin 11.6 - 15.9 g/dL 11.7 12.3 11.4(L)  Hematocrit 34.8 - 46.6 % 36.7 37.5 35.1  Platelets 145 - 400 K/uL 295 273 250   CMP Latest Ref Rng & Units 04/25/2017 10/18/2016 06/28/2016  Glucose 70 - 140 mg/dL 84 101 86  BUN 7 - 26 mg/dL 14 11.2 17.6  Creatinine 0.60 - 1.10 mg/dL 0.73 0.7 0.7  Sodium 136 - 145 mmol/L 142 139 142  Potassium 3.3 - 4.7 mmol/L 3.0(LL) 3.1(L) 3.4(L)  Chloride 98 - 109 mmol/L 103 - -  CO2 22 - 29 mmol/L _0 Calcium 8.4 - 10.4 mg/dL 10.0 10.2 10.0  Total Protein 6.4 - 8.3 g/dL 7.7 7.4 7.7  Total Bilirubin 0.2 - 1.2 mg/dL 1.0 1.06 1.41(H)  Alkaline Phos 40 - 150 U/L 85 74 83  AST 5 - 34 U/L _1 ALT 0 - 55 U/L _2 Results for VERITY, GILCREST (MRN [id number]) as of 10/24/2016 11:52  Ref. Range 02/22/2016 15:10 05/31/2016 14:44 10/18/2016 14:24  CEA ([facility]) Latest Ref Range: 0.00 - 5.00 ng/mL 1.05 1.19 1.71    PATHOLOGY REPORT  Diagnosis 09/24/2015 Colon, biopsy, Rectal sigmoid mass - ADENOCARCINOMA. - SEE COMMENT. Microscopic Comment Dr. Orene Desanctis has reviewed the case and concurs with this interpretation. Dr. Collene Mares was paged on 09/26/15. (JBK:gt, 09/26/15)  Diagnosis 01/31/2016 Colon, segmental resection for tumor, rectosigmoid - INVASIVE COLORECTAL ADENOCARCINOMA, 3.5 CM EXTENDING INTO PERIRECTAL CONNECTIVE TISSUE. - MARGINS NOT INVOLVED. - SEVENTEEN BENIGN LYMPH NODE (0/17). Microscopic Comment COLON AND RECTUM (INCLUDING TRANS-ANAL RESECTION): Specimen: Rectosigmoid colon. Procedure: Resection. Tumor site: Proximal rectum. Specimen integrity: Intact. Macroscopic intactness of mesorectum: Complete. Macroscopic tumor perforation: No. Invasive  tumor: Maximum size: 3.5 cm. Histologic type(s): Colorectal adenocarcinoma. Histologic grade and differentiation: G2: moderately differentiated/low grade Type of polyp in which invasive carcinoma arose: No residual polyp. Microscopic extension of invasive tumor: Into perirectal connective tissue. Lymph-Vascular invasion: Not identified. Peri-neural invasion: No. Tumor deposit(s) (discontinuous extramural extension): No Resection margins: Proximal margin: Free of tumor. Distal margin: Free of tumor. Circumferential (radial) (posterior ascending, posterior descending; lateral and posterior mid-rectum; and entire lower 1/3 rectum): Free of tumor. Mesenteric margin (sigmoid and transverse): N/A 1 of 5 Amended copy Amended FINAL for Politte, Shellie N [phone]) Microscopic Comment(continued) Distance closest margin (if all above margins negative): 3 cm from circumferential radial margin. Treatment effect (neo-adjuvant therapy): Yes. Additional polyp(s): Tubular adenoma with high grade dysplasia. Non-neoplastic findings: N/A Lymph nodes: number examined - 17; number positive: 0 Pathologic Staging: ypT3, ypN0, ypMX Ancillary studies: Microsatellite instability by PCR and mismatch repair protein by immunohistochemistry. (JDP:gt, 02/01/16)  PROCEDURES EUS 10/05/2015 Dr. Ardis Hughs  IMPRESSION:  Partially circumferential, 6cm long uT3N0 rectosigmoid adenocarcinoma with distal edge located 9cm from the anal verge. Submucosal injections of [place]k, at proximal and distal edges of the mass.  Colonoscopy and EGD 09/24/2015 Dr. Collene Mares  - Large circumferental polypoid mass in the rectosigmoid colon from 10-20 cm-biopsies Done. - Normal appearing, widely patent esophagus and GEJ. - Small hiatal hernia noted on retroflexion. - Mild antral gastritis-otherwise normal appearing stomach. - Normal examined duodenum. - No specimens collected.   RADIOGRAPHIC STUDIES: I have personally reviewed the  radiological images as listed and agreed with the findings in the report. No results found.   Diagnostic mammogram 01/31/17 IMPRESSION: No mammographic evidence of malignancy. A result letter of this screening mammogram will be mailed directly to the patient.  CT CAP w contrast 10/18/16 IMPRESSION: Status post low anterior resection with radiation changes in the presacral region. No findings suspicious for recurrent or metastatic disease.  Diagnostic mammogram of left breast 12/12/2015 IMPRESSION: The asymmetry in the upper-outer quadrant of the left breast corresponds with the finding on the patient's CT. This has been stable well over 2 years (since 2011), consistent with a benign finding. RECOMMENDATION: Screening mammogram in one year.(Code:SM-B-01Y) I have discussed the findings and recommendations with the patient. Results were also provided in writing at the conclusion of the visit. If applicable, a reminder letter will be sent to the patient regarding the next appointment. BI-RADS CATEGORY  2: Benign.    ASSESSMENT & PLAN: 62 y.o. female with past medical history of diabetes and hypertension, presented with rectal bleeding. Colonoscopy showed a large proximal rectal mass.  1. Rectal adenocarcinoma, proximal rectum, uT3N0M0, stage IIA, ypT3N0M0  -I have previously reviewed her colonoscopy, EUS, and CT of abdomen pelvis finding with pt in details -The EUS showed a T3 lesion, no suspicious lymphadenopathy. The CT scan showed no evidence of adenopathy or distant metastasis. She has stage IIA disease -We previously reviewed the natural history of rectal cancer and risk of recurrence after surgery. Given the stage II a disease, she likely has moderate risk of recurrence. We discussed the treatment option for stage IIA rectal cancer. The standard of care is neoadjuvant chemotherapy and radiation, followed by surgery, then adjuvant chemotherapy. -She completed neoadjuvant  chemoradiation, tolerated it very well -I previously discussed her surgical pathology findings with patient and her husband in details, she had minimal response to neoadjuvant chemotherapy and radiation, still has residual T3 disease, no lymph node metastasis. -We previously discussed the role of adjuvant chemotherapy in rectal cancer, given the significant residual disease, I recommended adjuvant Xeloda 2063m/m2 in the morning and 15080mm2 in the evening for 14 days, then 7 days off, for 5 cycles. -she has completed adjuvant Xeloda  -we discussed her surveillance CT scan from 10/18/2016, which showed no evidence of recurrence. -She is clinically doing well, exam is unremarkable. Lab reviewed. Her anemia has resolved. No clinical concern for recurrence. We'll continue surveillance -we again discussed surveillance and  will f/u with her in 6 months with a new scan.   -She will also follow-up with Dr. ThMarcello Mooresnd has a colonoscopy scheduled for 05/23/17 -I continued to encourage her to exercise, walk and go to the gym 1-2 times a week.  - Follow up in 6 months  2. Iron deficient anemia secondary to GI bleeding -Her iron studies on 09/22/15 and 10/11/15 were consistent with iron deficient anemia. -She has received blood transfusion and iv feraheme twice  -Repeat study has been normal -continue oral ferrous sulfate 1 tablet a day -anemia resolved now.   3. Anxiety  -Secondary to cancer diagnosis. -Improved lately.  4. HTN and DM -Continue medication. She'll follow-up with her primary care physician -I again discussed the impact of chemotherapy on her blood pressure and diabetes, we'll monitor her blood pressure and glucose level closely, and is just the medication if needed. -Her blood pressure has been high lately, but close to normal at home. I encouraged her to monitor at home and follow-up with her primary care physician -Continued to recommend low salt diet and exercise not just walking but  being more active on the weekends.   5. Left breast mass -Previous CT chest revealed a left breast mass, however her screening mammogram was negative in 09/2015 -Her diagnostic  mammogram showed the left breast lesion has been stable for a few years, likely benign, no biopsy was recommended. -Mammogram on 01/31/17 revealed no mammographic evidence of malignancy. Continue annual screening mammogram.  6. Hypokalemia  -persistent, likely related to her HTN meds HCTZ  -potassium today is 3. I recommend her to increase her K supplement to twice daily. I will refill today.      PLAN F/u in 6 months with CT CAP w contrast before  Refilled KLOR-CON today, she will double her dose F/u with Dr. Marcello Moores and colonoscopy in 05/2017  All questions were answered. The patient knows to call the clinic with any problems, questions or concerns.  I spent 20 minutes counseling the patient face to face. The total time spent in the appointment was 25 minutes and more than 50% was on counseling.  This document serves as a record of services personally performed by [name] Merle, MD. It was created on her behalf by Theresia Bough, a trained medical scribe. The creation of this record is based on the scribe's personal observations and the provider's statements to them.   I have reviewed the above documentation for accuracy and completeness, and I agree with the above.     [name] Merle, MD 04/25/2017

## 2017-04-25 NOTE — Telephone Encounter (Signed)
Gave avs and calendar for july °

## 2017-04-26 ENCOUNTER — Encounter: Payer: Self-pay | Admitting: Hematology

## 2017-04-28 LAB — FERRITIN: FERRITIN: 55 ng/mL (ref 9–269)

## 2017-04-28 LAB — IRON AND TIBC
IRON: 67 ug/dL (ref 41–142)
Saturation Ratios: 15 % — ABNORMAL LOW (ref 21–57)
TIBC: 435 ug/dL (ref 236–444)
UIBC: 368 ug/dL

## 2017-04-28 LAB — CEA (IN HOUSE-CHCC): CEA (CHCC-In House): 1.39 ng/mL (ref 0.00–5.00)

## 2017-04-30 ENCOUNTER — Telehealth: Payer: Self-pay | Admitting: *Deleted

## 2017-04-30 NOTE — Telephone Encounter (Signed)
TCT patient regarding labs results from 04/25/17. Spoke with patient and informed her of results of labs done 04/25/17. Pt voiced understanding.  Pt's K+ was low on that day and Dr. Burr Medico had ordered KCL 40 meq daily which pt states she is taking.  No questions or concerns verbalized. Pt states she feels well.

## 2017-05-01 ENCOUNTER — Other Ambulatory Visit: Payer: Self-pay | Admitting: Internal Medicine

## 2017-05-04 ENCOUNTER — Other Ambulatory Visit: Payer: Self-pay | Admitting: Internal Medicine

## 2017-05-05 ENCOUNTER — Telehealth: Payer: Self-pay | Admitting: Internal Medicine

## 2017-05-05 MED ORDER — ATORVASTATIN CALCIUM 20 MG PO TABS: 20.0000 mg | ORAL_TABLET | Freq: Every day | ORAL | 0 refills | Status: DC

## 2017-05-05 MED ORDER — HYDROCHLOROTHIAZIDE 25 MG PO TABS: 25.0000 mg | ORAL_TABLET | Freq: Every day | ORAL | 0 refills | Status: DC

## 2017-05-05 MED ORDER — AMLODIPINE BESYLATE 10 MG PO TABS: 10.0000 mg | ORAL_TABLET | Freq: Every day | ORAL | 0 refills | Status: DC

## 2017-05-05 NOTE — Telephone Encounter (Signed)
30 day supply of these 3 medications will be filled. Controlled medication will be filled during OV if Dr. Jenny Reichmann sees a necessity.   Amlodipine 10mg , Atorvatstatin 20mg , Hydrochlorothiazide 25mg 

## 2017-05-05 NOTE — Addendum Note (Signed)
Addended by: Juliet Rude on: 05/05/2017 10:44 AM   Modules accepted: Orders

## 2017-05-05 NOTE — Telephone Encounter (Signed)
Appt made for CPE for 2-15, can Pt receive refill until then

## 2017-05-05 NOTE — Telephone Encounter (Signed)
Denied, Pt needs an appt, she has not been seen since 2017.

## 2017-05-05 NOTE — Telephone Encounter (Signed)
Copied from [place] [id number]. Topic: Quick Communication - See Telephone Encounter >> May 05, 2017 10:07 AM Conception Chancy, NT wrote: CRM for notification. See Telephone encounter for:  05/05/17.  Requesting a refill on following medications. Amlodipine 10mg , Atorvatstatin 20mg , Hydrochlorothiazide 25mg , Hydrocodone-acetaminophen 5-325mg .   Local [facility] pharmacy on file.

## 2017-05-09 ENCOUNTER — Encounter: Payer: Self-pay | Admitting: Gastroenterology

## 2017-05-12 ENCOUNTER — Telehealth: Payer: Self-pay

## 2017-05-12 DIAGNOSIS — Z85038 Personal history of other malignant neoplasm of large intestine: Secondary | ICD-10-CM

## 2017-05-12 MED ORDER — NA SULFATE-K SULFATE-MG SULF 17.5-3.13-1.6 GM/177ML PO SOLN: ORAL | 0 refills | Status: DC

## 2017-05-12 NOTE — Telephone Encounter (Signed)
[facility]ent [facility] with coupon information as requested by pharmacist. Spoke with patient she is aware that the [place] was resent and she will see how much this cost and then call us back if needed.

## 2017-05-23 ENCOUNTER — Encounter: Payer: Self-pay | Admitting: Gastroenterology

## 2017-05-23 ENCOUNTER — Ambulatory Visit (AMBULATORY_SURGERY_CENTER): Payer: 59 | Admitting: Gastroenterology

## 2017-05-23 ENCOUNTER — Other Ambulatory Visit: Payer: Self-pay

## 2017-05-23 ENCOUNTER — Encounter: Payer: 59 | Admitting: Gastroenterology

## 2017-05-23 VITALS — BP 138/73 | HR 96 | Temp 100.0°F | Resp 16 | Ht 65.0 in | Wt 184.0 lb

## 2017-05-23 DIAGNOSIS — Z1211 Encounter for screening for malignant neoplasm of colon: Secondary | ICD-10-CM | POA: Diagnosis not present

## 2017-05-23 DIAGNOSIS — D123 Benign neoplasm of transverse colon: Secondary | ICD-10-CM

## 2017-05-23 DIAGNOSIS — Z85038 Personal history of other malignant neoplasm of large intestine: Secondary | ICD-10-CM

## 2017-05-23 MED ORDER — SODIUM CHLORIDE 0.9 % IV SOLN: 500.0000 mL | Freq: Once | INTRAVENOUS | Status: DC

## 2017-05-23 NOTE — Progress Notes (Signed)
A and O x3. Report to RN. Tolerated MAC anesthesia well.

## 2017-05-23 NOTE — Progress Notes (Signed)
Pt. BP, pulse, and temp elevated.  Dr. Ardis Hughs advised.  BP 140/90 on recheck, pulse 120 and temp 99.5.

## 2017-05-23 NOTE — Patient Instructions (Signed)
YOU HAD AN ENDOSCOPIC PROCEDURE TODAY AT THE [facility]:   Refer to the procedure report that was given to you for any specific questions about what was found during the examination.  If the procedure report does not answer your questions, please call your gastroenterologist to clarify.  If you requested that your care partner not be given the details of your procedure findings, then the procedure report has been included in a sealed envelope for you to review at your convenience later.  YOU SHOULD EXPECT: Some feelings of bloating in the abdomen. Passage of more gas than usual.  Walking can help get rid of the air that was put into your GI tract during the procedure and reduce the bloating. If you had a lower endoscopy (such as a colonoscopy or flexible sigmoidoscopy) you may notice spotting of blood in your stool or on the toilet paper. If you underwent a bowel prep for your procedure, you may not have a normal bowel movement for a few days.  Please Note:  You might notice some irritation and congestion in your nose or some drainage.  This is from the oxygen used during your procedure.  There is no need for concern and it should clear up in a day or so.  SYMPTOMS TO REPORT IMMEDIATELY:   Following lower endoscopy (colonoscopy or flexible sigmoidoscopy):  Excessive amounts of blood in the stool  Significant tenderness or worsening of abdominal pains  Swelling of the abdomen that is new, acute  Fever of 100F or higher  Please see handouts given to you on polyps.  For urgent or emergent issues, a gastroenterologist can be reached at any hour by calling [phone].   DIET:  We do recommend a small meal at first, but then you may proceed to your regular diet.  Drink plenty of fluids but you should avoid alcoholic beverages for 24 hours.  ACTIVITY:  You should plan to take it easy for the rest of today and you should NOT DRIVE or use heavy machinery until tomorrow (because of the  sedation medicines used during the test).    FOLLOW UP: Our staff will call the number listed on your records the next business day following your procedure to check on you and address any questions or concerns that you may have regarding the information given to you following your procedure. If we do not reach you, we will leave a message.  However, if you are feeling well and you are not experiencing any problems, there is no need to return our call.  We will assume that you have returned to your regular daily activities without incident.  If any biopsies were taken you will be contacted by phone or by letter within the next 1-3 weeks.  Please call us at [phone] if you have not heard about the biopsies in 3 weeks.    SIGNATURES/CONFIDENTIALITY: You and/or your care partner have signed paperwork which will be entered into your electronic medical record.  These signatures attest to the fact that that the information above on your After Visit Summary has been reviewed and is understood.  Full responsibility of the confidentiality of this discharge information lies with you and/or your care-partner.  Thank you for letting us take care of your healthcare needs today. 

## 2017-05-23 NOTE — Progress Notes (Signed)
Pt's states no medical or surgical changes since previsit or office visit. 

## 2017-05-23 NOTE — Op Note (Signed)
[name] Name: Karen Good Procedure Date: 05/23/2017 10:45 AM MRN: [id number] Endoscopist: Milus Banister , MD Age: 62 Referring MD:  Date of Birth: 11/23/1955 Gender: Female Account #: [id number] Procedure:                Colonoscopy Indications:              High risk colon cancer surveillance: Personal                            history of colon cancer; T3N0M0 rectal                            adenocarcinoma diagnosed 2017, s/p neoadjuvant                            chemo/XRT, LAR, adjuvant chemo Medicines:                Monitored Anesthesia Care Procedure:                Pre-Anesthesia Assessment:                           - Prior to the procedure, a History and Physical                            was performed, and patient medications and                            allergies were reviewed. The patient's tolerance of                            previous anesthesia was also reviewed. The risks                            and benefits of the procedure and the sedation                            options and risks were discussed with the patient.                            All questions were answered, and informed consent                            was obtained. Prior Anticoagulants: The patient has                            taken no previous anticoagulant or antiplatelet                            agents. ASA Grade Assessment: II - A patient with                            mild systemic disease. After reviewing the risks  and benefits, the patient was deemed in                            satisfactory condition to undergo the procedure.                           After obtaining informed consent, the colonoscope                            was passed under direct vision. Throughout the                            procedure, the patient's blood pressure, pulse, and                            oxygen saturations were monitored continuously.  The                            Colonoscope was introduced through the anus and                            advanced to the the cecum, identified by                            appendiceal orifice and ileocecal valve. The                            colonoscopy was performed without difficulty. The                            patient tolerated the procedure well. The quality                            of the bowel preparation was excellent. The                            ileocecal valve, appendiceal orifice, and rectum                            were photographed. Scope In: 10:50:50 AM Scope Out: 11:02:44 AM Scope Withdrawal Time: 0 hours 10 minutes 5 seconds  Total Procedure Duration: 0 hours 11 minutes 54 seconds  Findings:                 A 3 mm polyp was found in the transverse colon. The                            polyp was sessile. The polyp was removed with a                            cold snare. Resection and retrieval were complete.                           Colo-colonic anastomosis 4cm from the anal verge  was normal appearing.                           The exam was otherwise without abnormality on                            direct and retroflexion views. Complications:            No immediate complications. Estimated blood loss:                            None. Estimated Blood Loss:     Estimated blood loss: none. Impression:               - One 3 mm polyp in the transverse colon, removed                            with a cold snare. Resected and retrieved.                           - Normal colo-colonic anastomosis about 4cm from                            the anal verge.                           - The examination was otherwise normal on direct                            and retroflexion views. Recommendation:           - Patient has a contact number available for                            emergencies. The signs and symptoms of potential                             delayed complications were discussed with the                            patient. Return to normal activities tomorrow.                            Written discharge instructions were provided to the                            patient.                           - Resume previous diet.                           - Continue present medications.                           - Await pathology results. Likely you will need a  repeat colonoscopy in 3 years. Milus Banister, MD 05/23/2017 11:08:29 AM This report has been signed electronically.

## 2017-05-23 NOTE — Progress Notes (Signed)
Called to room to assist during endoscopic procedure.  Patient ID and intended procedure confirmed with present staff. Received instructions for my participation in the procedure from the performing physician.  

## 2017-05-26 ENCOUNTER — Telehealth: Payer: Self-pay | Admitting: *Deleted

## 2017-05-26 NOTE — Telephone Encounter (Signed)
  Follow up Call-  Call back number 05/23/2017  Post procedure Call Back phone  # [phone]  Permission to leave phone message Yes  Some recent data might be hidden     Patient questions:  Do you have a fever, pain , or abdominal swelling? No. Pain Score  0 *  Have you tolerated food without any problems? Yes.    Have you been able to return to your normal activities? Yes.    Do you have any questions about your discharge instructions: Diet   No. Medications  No. Follow up visit  No.  Do you have questions or concerns about your Care? No.  Actions: * If pain score is 4 or above: No action needed, pain <4.

## 2017-05-28 ENCOUNTER — Encounter: Payer: Self-pay | Admitting: Gastroenterology

## 2017-05-29 ENCOUNTER — Other Ambulatory Visit: Payer: Self-pay | Admitting: Internal Medicine

## 2017-05-29 ENCOUNTER — Telehealth: Payer: Self-pay | Admitting: Internal Medicine

## 2017-05-29 NOTE — Telephone Encounter (Signed)
Copied from [place] [id number]. Topic: General - Other >> May 29, 2017  2:40 PM Darl Householder, RMA wrote: Reason for CRM: Medication refill request for glipiZIDE (GLUCOTROL XL) 5 MG 24 hr tablet to be sent to [facility], pt is completely out of this medication

## 2017-05-29 NOTE — Telephone Encounter (Signed)
Glucophage refill request Last OV 03/22/16 with Dr. Orlan Leavens at [facility]

## 2017-05-29 NOTE — Telephone Encounter (Signed)
Refills denied pt is overdue for appt. She has not seen MD since 03/2016...Karen Good

## 2017-05-30 ENCOUNTER — Encounter: Payer: 59 | Admitting: Internal Medicine

## 2017-05-30 MED ORDER — GLIPIZIDE ER 5 MG PO TB24: 10.0000 mg | ORAL_TABLET | Freq: Every day | ORAL | 0 refills | Status: DC

## 2017-05-30 NOTE — Telephone Encounter (Signed)
30d supply had been sent in

## 2017-05-30 NOTE — Telephone Encounter (Signed)
Patient is scheduled for CPE 06/06/2017, can a refill be called in for her? Please advise Call back [phone]

## 2017-05-30 NOTE — Addendum Note (Signed)
Addended by: Juliet Rude on: 05/30/2017 03:17 PM   Modules accepted: Orders

## 2017-06-01 ENCOUNTER — Other Ambulatory Visit: Payer: Self-pay | Admitting: Internal Medicine

## 2017-06-06 ENCOUNTER — Encounter: Payer: Self-pay | Admitting: Internal Medicine

## 2017-06-06 ENCOUNTER — Ambulatory Visit (INDEPENDENT_AMBULATORY_CARE_PROVIDER_SITE_OTHER): Payer: 59 | Admitting: Internal Medicine

## 2017-06-06 ENCOUNTER — Other Ambulatory Visit (INDEPENDENT_AMBULATORY_CARE_PROVIDER_SITE_OTHER): Payer: 59

## 2017-06-06 VITALS — BP 180/92 | HR 100 | Temp 98.3°F | Ht 65.0 in | Wt 179.0 lb

## 2017-06-06 DIAGNOSIS — Z0000 Encounter for general adult medical examination without abnormal findings: Secondary | ICD-10-CM | POA: Diagnosis not present

## 2017-06-06 DIAGNOSIS — Z114 Encounter for screening for human immunodeficiency virus [HIV]: Secondary | ICD-10-CM

## 2017-06-06 DIAGNOSIS — I10 Essential (primary) hypertension: Secondary | ICD-10-CM | POA: Diagnosis not present

## 2017-06-06 DIAGNOSIS — R609 Edema, unspecified: Secondary | ICD-10-CM

## 2017-06-06 DIAGNOSIS — E119 Type 2 diabetes mellitus without complications: Secondary | ICD-10-CM | POA: Diagnosis not present

## 2017-06-06 DIAGNOSIS — Z0001 Encounter for general adult medical examination with abnormal findings: Secondary | ICD-10-CM | POA: Diagnosis not present

## 2017-06-06 DIAGNOSIS — E876 Hypokalemia: Secondary | ICD-10-CM | POA: Insufficient documentation

## 2017-06-06 LAB — LIPID PANEL
CHOL/HDL RATIO: 3
Cholesterol: 152 mg/dL (ref 0–200)
HDL: 53.3 mg/dL (ref >39.00)
LDL Cholesterol: 75 mg/dL (ref 0–99)
NonHDL: 98.57
Triglycerides: 118 mg/dL (ref 0.0–149.0)
VLDL: 23.6 mg/dL (ref 0.0–40.0)

## 2017-06-06 LAB — TSH: TSH: 1.5 u[IU]/mL (ref 0.35–4.50)

## 2017-06-06 MED ORDER — AMLODIPINE BESYLATE 5 MG PO TABS: 5.0000 mg | ORAL_TABLET | Freq: Every day | ORAL | 3 refills | Status: DC

## 2017-06-06 MED ORDER — ATENOLOL 50 MG PO TABS
50.0000 mg | ORAL_TABLET | Freq: Every day | ORAL | 3 refills | Status: DC
Start: 2017-06-06 — End: 2018-05-26

## 2017-06-06 NOTE — Progress Notes (Signed)
Subjective:    Patient ID: Karen Good, female    DOB: 07/03/1955, 62 y.o.   MRN: [id number]  HPI  Here for wellness and f/u;  Overall doing ok; Pt denies neurological change such as new headache, facial or extremity weakness.  Pt denies polydipsia, polyuria, or low sugar symptoms. Pt states overall good compliance with treatment and medications, good tolerability, and has been trying to follow appropriate diet.  Pt denies worsening depressive symptoms, suicidal ideation or panic. No fever, night sweats, wt loss, loss of appetite, or other constitutional symptoms.  Pt states good ability with ADL's, has low fall risk, home safety reviewed and adequate, no other significant changes in hearing or vision, and only occasionally active with exercise. Pt denies chest pain, increased sob or doe, wheezing, orthopnea, PND, palpitations, dizziness or syncope, though has worsening pedal edema after increased amlodipine 10 mg.  BP at home 140/80, occas higher even on this regimen.  Has hx of low K, but denies recent muscle cramps or other complaints.   Past Medical History:  Diagnosis Date  . Anemia   . BACK PAIN 04/13/2010  . BACK STRAIN, LUMBAR 04/13/2010  . Cancer (HCC)    rectal  . DIABETES MELLITUS, TYPE II 06/02/2008  . History of blood transfusion   . History of chemotherapy   . History of radiation therapy   . Hx of transfusion of whole blood 09/2015   prior to colon surgery - [facility]  . HYPERLIPIDEMIA 06/02/2008  . HYPERTENSION 06/02/2008   Past Surgical History:  Procedure Laterality Date  . COLONOSCOPY Left 09/24/2015   Procedure: COLONOSCOPY;  T[place]lock: Juanita Craver, MD;  Location: [facility];  Service: Endoscopy;  Laterality: Left;  . COLONOSCOPY  09/2015   Dr Collene Mares  . ESOPHAGOGASTRODUODENOSCOPY N/A 09/24/2015   Procedure: ESOPHAGOGASTRODUODENOSCOPY (EGD);  Swader: Juanita Craver, MD;  Location: [facility];  Service: Endoscopy;  Laterality: N/A;  . EUS N/A 10/05/2015   Procedure: LOWER  ENDOSCOPIC ULTRASOUND (EUS);  Shelley: Milus Banister, MD;  Location: [facility];  Service: Endoscopy;  Laterality: N/A;  . s/p breast biopsy Left 07/2005   benign  . TUBAL LIGATION    . TUBAL LIGATION      reports that  has never smoked. she has never used smokeless tobacco. She reports that she drinks alcohol. She reports that she does not use drugs. family history includes Diabetes in her brother, father, mother, other, and sister; Heart disease in her father; Hypertension in her father. No Known Allergies Current Outpatient Medications on File Prior to Visit  Medication Sig Dispense Refill  . atorvastatin (LIPITOR) 20 MG tablet TAKE 1 TABLET (20 MG TOTAL) BY MOUTH DAILY. **PATIENT NEEDS APPT FOR ADDITIONAL REFILLS** 30 tablet 0  . Blood Glucose Monitoring Suppl (ONE TOUCH ULTRA 2) w/Device KIT Use as directed daily 1 each 0  . glipiZIDE (GLUCOTROL XL) 5 MG 24 hr tablet Take 2 tablets (10 mg total) by mouth daily. 60 tablet 0  . glucose blood (ONE TOUCH ULTRA TEST) test strip Use as instructed once daily 100 each 12  . hydrochlorothiazide (HYDRODIURIL) 25 MG tablet TAKE 1 TABLET (25 MG TOTAL) BY MOUTH DAILY. **PT NEEDS APPT FOR ADDITIONAL REFILLS** 30 tablet 0  . HYDROcodone-acetaminophen (NORCO) 5-325 MG tablet Take 1 tablet by mouth every 6 (six) hours as needed for moderate pain. To last 30 days 30 tablet 0  . ibuprofen (ADVIL,MOTRIN) 200 MG tablet Take 400 mg by mouth every 6 (six) hours as needed for  mild pain.    Marland Kitchen irbesartan (AVAPRO) 300 MG tablet Take 1 tablet (300 mg total) by mouth at bedtime. 90 tablet 3  . Lancets Misc. MISC Use as directed once daily 100 each 11  . pioglitazone (ACTOS) 45 MG tablet TAKE 1 TABLET (45 MG TOTAL) BY MOUTH DAILY. 90 tablet 2  . potassium chloride SA (KLOR-CON M20) 20 MEQ tablet Take 2 tablets (40 mEq total) by mouth daily. 60 tablet 2  . sitaGLIPtin-metformin (JANUMET) 50-1000 MG tablet Take 1 tablet by mouth 2 (two) times daily. 180 tablet 3  .  [DISCONTINUED] nebivolol (BYSTOLIC) 10 MG tablet Take 1 tablet (10 mg total) by mouth daily. 90 tablet 3  . [DISCONTINUED] valsartan (DIOVAN) 320 MG tablet Take 1 tablet (320 mg total) by mouth daily. 90 tablet 3   No current facility-administered medications on file prior to visit.    Review of Systems Constitutional: Negative for other unusual diaphoresis, sweats, appetite or weight changes HENT: Negative for other worsening hearing loss, ear pain, facial swelling, mouth sores or neck stiffness.   Eyes: Negative for other worsening pain, redness or other visual disturbance.  Respiratory: Negative for other stridor or swelling Cardiovascular: Negative for other palpitations or other chest pain  Gastrointestinal: Negative for worsening diarrhea or loose stools, blood in stool, distention or other pain Genitourinary: Negative for hematuria, flank pain or other change in urine volume.  Musculoskeletal: Negative for myalgias or other joint swelling.  Skin: Negative for other color change, or other wound or worsening drainage.  Neurological: Negative for other syncope or numbness. Hematological: Negative for other adenopathy or swelling Psychiatric/Behavioral: Negative for hallucinations, other worsening agitation, SI, self-injury, or new decreased concentration All other system neg per pt    Objective:   Physical Exam BP (!) 180/92   Pulse 100   Temp 98.3 F (36.8 C) (Oral)   Ht '5\' 5"'$  (1.651 m)   Wt 179 lb (81.2 kg)   SpO2 99%   BMI 29.79 kg/m  VS noted,  Constitutional: Pt is oriented to person, place, and time. Appears well-developed and well-nourished, in no significant distress and comfortable Head: Normocephalic and atraumatic  Eyes: Conjunctivae and EOM are normal. Pupils are equal, round, and reactive to light Right Ear: External ear normal without discharge Left Ear: External ear normal without discharge Nose: Nose without discharge or deformity Mouth/Throat: Oropharynx is  without other ulcerations and moist  Neck: Normal range of motion. Neck supple. No JVD present. No tracheal deviation present or significant neck LA or mass Cardiovascular: Normal rate, regular rhythm, normal heart sounds and intact distal pulses.   Pulmonary/Chest: WOB normal and breath sounds without rales or wheezing  Abdominal: Soft. Bowel sounds are normal. NT. No HSM  Musculoskeletal: Normal range of motion. Exhibits bilat 1+ ankle edema Lymphadenopathy: Has no other cervical adenopathy.  Neurological: Pt is alert and oriented to person, place, and time. Pt has normal reflexes. No cranial nerve deficit. Motor grossly intact, Gait intact Skin: Skin is warm and dry. No rash noted or new ulcerations Psychiatric:  Has normal mood and affect. Behavior is normal without agitation No other exam findings Lab Results  Component Value Date   WBC 5.6 04/25/2017   HGB 11.7 04/25/2017   HCT 36.7 04/25/2017   PLT 295 04/25/2017   GLUCOSE 84 04/25/2017   CHOL 152 06/06/2017   TRIG 118.0 06/06/2017   HDL 53.30 06/06/2017   LDLCALC 75 06/06/2017   ALT 15 04/25/2017   AST 14 04/25/2017  NA 142 04/25/2017   K 3.0 (LL) 04/25/2017   CL 103 04/25/2017   CREATININE 0.73 04/25/2017   BUN 14 04/25/2017   CO2 25 04/25/2017   TSH 1.50 06/06/2017   HGBA1C 6.3 03/20/2016   MICROALBUR 8.3 (H) 09/22/2015        Assessment & Plan:

## 2017-06-06 NOTE — Patient Instructions (Addendum)
Please remember to see you yearly Eye Doctor appt, and GYN yearly appt  OK to decrease the amlodipine to 5 mg per day  OK to change the atenolol to 50 mg per day (once a day only)  Please continue all other medications as before, and refills have been done if requested.  Please have the pharmacy call with any other refills you may need.  Please continue your efforts at being more active, low cholesterol diet, and weight control.  You are otherwise up to date with prevention measures today.  Please keep your appointments with your specialists as you may have planned  Please go to the LAB in the Basement (turn left off the elevator) for the tests to be done today  You will be contacted by phone if any changes need to be made immediately.  Otherwise, you will receive a letter about your results with an explanation, but please check with MyChart first.  Please remember to sign up for MyChart if you have not done so, as this will be important to you in the future with finding out test results, communicating by private email, and scheduling acute appointments online when needed.  Please return in 6 months, or sooner if needed, with Lab testing done 3-5 days before

## 2017-06-07 LAB — HIV ANTIBODY (ROUTINE TESTING W REFLEX): HIV 1&2 Ab, 4th Generation: NONREACTIVE

## 2017-06-07 NOTE — Assessment & Plan Note (Signed)
Also for f/u lab, consider addk supplement if low

## 2017-06-07 NOTE — Assessment & Plan Note (Signed)
Will need improved control with reduced amlodipine, ok for increased atenolol to 50 qd

## 2017-06-07 NOTE — Assessment & Plan Note (Addendum)
Suspect related to high dose amlodipine, ok to reduce to 5 mg daily  In addition to the time spent performing CPE, I spent an additional 15 minutes face to face,in which greater than 50% of this time was spent in counseling and coordination of care for patient's illness as documented, including the differential dx, treatment, further evaluation and other management of peripheral edema, hypokalemia, HTN, and DM

## 2017-06-07 NOTE — Assessment & Plan Note (Signed)

## 2017-06-07 NOTE — Assessment & Plan Note (Signed)
stable overall by history and exam, recent data reviewed with pt, and pt to continue medical treatment as before,  to f/u any worsening symptoms or concerns Lab Results  Component Value Date   HGBA1C 6.3 03/20/2016

## 2017-06-26 ENCOUNTER — Other Ambulatory Visit: Payer: Self-pay | Admitting: Internal Medicine

## 2017-06-26 NOTE — Telephone Encounter (Signed)
Routing to dr Jenny Reichmann, a1c lab (and other labs) were not resulted because of expected date July was documented in orders----i'm not showing current a1c lab value for patient---are you ok with refilling rx---patient got other labs on day of visit 06/06/17, but labs with expected date of July were not drawn---patient is not due return office visit yet, please advise, thanks

## 2017-06-29 ENCOUNTER — Other Ambulatory Visit: Payer: Self-pay | Admitting: Internal Medicine

## 2017-07-02 ENCOUNTER — Other Ambulatory Visit: Payer: Self-pay | Admitting: Internal Medicine

## 2017-07-23 ENCOUNTER — Other Ambulatory Visit: Payer: Self-pay | Admitting: Hematology

## 2017-07-23 DIAGNOSIS — C20 Malignant neoplasm of rectum: Secondary | ICD-10-CM

## 2017-07-23 DIAGNOSIS — E876 Hypokalemia: Secondary | ICD-10-CM

## 2017-08-07 DIAGNOSIS — Z85048 Personal history of other malignant neoplasm of rectum, rectosigmoid junction, and anus: Secondary | ICD-10-CM | POA: Diagnosis not present

## 2017-09-11 ENCOUNTER — Other Ambulatory Visit: Payer: Self-pay | Admitting: Internal Medicine

## 2017-10-12 ENCOUNTER — Other Ambulatory Visit: Payer: Self-pay | Admitting: Internal Medicine

## 2017-10-14 ENCOUNTER — Other Ambulatory Visit: Payer: 59

## 2017-10-22 ENCOUNTER — Other Ambulatory Visit: Payer: Self-pay | Admitting: Hematology

## 2017-10-22 ENCOUNTER — Other Ambulatory Visit: Payer: Self-pay | Admitting: Internal Medicine

## 2017-10-22 DIAGNOSIS — C20 Malignant neoplasm of rectum: Secondary | ICD-10-CM

## 2017-10-22 DIAGNOSIS — E876 Hypokalemia: Secondary | ICD-10-CM

## 2017-10-24 ENCOUNTER — Telehealth: Payer: Self-pay | Admitting: Hematology

## 2017-10-24 ENCOUNTER — Ambulatory Visit: Payer: 59 | Admitting: Hematology

## 2017-10-24 NOTE — Telephone Encounter (Signed)
Called pt re needing a lab appt on 7/19 - spoke w/ pt and confirmed appts.

## 2017-10-31 ENCOUNTER — Encounter (HOSPITAL_COMMUNITY): Payer: Self-pay

## 2017-10-31 ENCOUNTER — Ambulatory Visit (HOSPITAL_COMMUNITY)
Admission: RE | Admit: 2017-10-31 | Discharge: 2017-10-31 | Disposition: A | Discharge status: Home / Self Care | Payer: 59 | Source: Ambulatory Visit | Attending: Hematology | Admitting: Hematology

## 2017-10-31 ENCOUNTER — Inpatient Hospital Stay: Payer: 59 | Attending: Hematology

## 2017-10-31 DIAGNOSIS — D259 Leiomyoma of uterus, unspecified: Secondary | ICD-10-CM | POA: Insufficient documentation

## 2017-10-31 DIAGNOSIS — D500 Iron deficiency anemia secondary to blood loss (chronic): Secondary | ICD-10-CM | POA: Insufficient documentation

## 2017-10-31 DIAGNOSIS — C20 Malignant neoplasm of rectum: Secondary | ICD-10-CM | POA: Diagnosis not present

## 2017-10-31 DIAGNOSIS — C649 Malignant neoplasm of unspecified kidney, except renal pelvis: Secondary | ICD-10-CM | POA: Diagnosis not present

## 2017-10-31 DIAGNOSIS — D509 Iron deficiency anemia, unspecified: Secondary | ICD-10-CM

## 2017-10-31 LAB — CBC WITH DIFFERENTIAL/PLATELET
Basophils Absolute: 0 10*3/uL (ref 0.0–0.1)
Basophils Relative: 0 %
Eosinophils Absolute: 0 10*3/uL (ref 0.0–0.5)
Eosinophils Relative: 0 %
HEMATOCRIT: 37.3 % (ref 34.8–46.6)
HEMOGLOBIN: 12.1 g/dL (ref 11.6–15.9)
LYMPHS ABS: 0.7 10*3/uL — AB (ref 0.9–3.3)
LYMPHS PCT: 14 %
MCH: 27.3 pg (ref 25.1–34.0)
MCHC: 32.4 g/dL (ref 31.5–36.0)
MCV: 84.4 fL (ref 79.5–101.0)
Monocytes Absolute: 0.3 10*3/uL (ref 0.1–0.9)
Monocytes Relative: 6 %
NEUTROS PCT: 80 %
Neutro Abs: 4 10*3/uL (ref 1.5–6.5)
Platelets: 280 10*3/uL (ref 145–400)
RBC: 4.41 MIL/uL (ref 3.70–5.45)
RDW: 15.4 % — ABNORMAL HIGH (ref 11.2–14.5)
WBC: 5 10*3/uL (ref 3.9–10.3)

## 2017-10-31 LAB — COMPREHENSIVE METABOLIC PANEL
ALK PHOS: 88 U/L (ref 38–126)
ALT: 15 U/L (ref 0–44)
AST: 13 U/L — ABNORMAL LOW (ref 15–41)
Albumin: 4.4 g/dL (ref 3.5–5.0)
Anion gap: 11 (ref 5–15)
BUN: 11 mg/dL (ref 8–23)
CALCIUM: 10.2 mg/dL (ref 8.9–10.3)
CO2: 28 mmol/L (ref 22–32)
Chloride: 102 mmol/L (ref 98–111)
Creatinine, Ser: 0.71 mg/dL (ref 0.44–1.00)
GFR calc Af Amer: >60 mL/min (ref >60)
GFR calc non Af Amer: >60 mL/min (ref >60)
GLUCOSE: 138 mg/dL — AB (ref 70–99)
POTASSIUM: 3.6 mmol/L (ref 3.5–5.1)
Sodium: 141 mmol/L (ref 135–145)
TOTAL PROTEIN: 7.8 g/dL (ref 6.5–8.1)
Total Bilirubin: 1 mg/dL (ref 0.3–1.2)

## 2017-10-31 LAB — IRON AND TIBC
Iron: 76 ug/dL (ref 41–142)
Saturation Ratios: 16 % — ABNORMAL LOW (ref 21–57)
TIBC: 474 ug/dL — ABNORMAL HIGH (ref 236–444)
UIBC: 397 ug/dL

## 2017-10-31 LAB — FERRITIN: Ferritin: 55 ng/mL (ref 11–307)

## 2017-10-31 LAB — CEA (IN HOUSE-CHCC): CEA (CHCC-In House): 1.93 ng/mL (ref 0.00–5.00)

## 2017-10-31 MED ORDER — IOPAMIDOL (ISOVUE-300) INJECTION 61%
INTRAVENOUS | Status: AC
  Filled 2017-10-31: qty 100

## 2017-10-31 MED ORDER — IOPAMIDOL (ISOVUE-300) INJECTION 61%
100.0000 mL | Freq: Once | INTRAVENOUS | Status: AC | PRN
  Administered 2017-10-31: 100 mL via INTRAVENOUS

## 2017-11-03 ENCOUNTER — Telehealth: Payer: Self-pay

## 2017-11-03 NOTE — Telephone Encounter (Signed)
Spoke with patient per Dr. Burr Medico notified CT scan normal, no concerns, informed patient she can move her appointment up if she needs to, states she will keep it as it is for now to f/u in one month.

## 2017-11-03 NOTE — Telephone Encounter (Signed)
-----   Message from [name] Merle, MD sent at 11/03/2017  7:51 AM EDT ----- Please let pt know her CT results, no concerns. She is scheduled to see me in a month, OK to move earlier if she wants. Thanks.   [name] Merle  11/03/2017

## 2017-11-11 ENCOUNTER — Telehealth: Payer: Self-pay | Admitting: Hematology

## 2017-11-11 NOTE — Telephone Encounter (Signed)
Patient called to reschedule and nurse said ok to cancel lab will use the one from [place]

## 2017-11-19 NOTE — Progress Notes (Signed)
[place]  [phone] [phone]  Clinic Follow up Note   Patient Care Team: Biagio Borg, MD as PCP - Eber Jones, MD as Consulting Physician (Radiation Oncology) Leighton Ruff, MD as Consulting Physician (General Surgery) Milus Banister, MD as Attending Physician (Gastroenterology) Tania Ade, RN as Registered Nurse 11/21/2017   CHIEF COMPLAINTS:  Follow up rectal cancer  Oncology History   Rectal cancer [facility])   Staging form: Colon and Rectum, AJCC 7th Edition   - Clinical: Stage IIA (T3, N0, M0) - Signed by [name] Merle, MD on 10/10/2015   - Pathologic stage from 01/31/2016: Stage IIA (T3, N0, cM0) - Signed by [name] Merle, MD on 02/22/2016       Rectal cancer ([facility])   09/22/2015 Imaging    CT ABD/PELVIS: Soft tissue fullness at the rectosigmoid junction. Otherwise, no acute process in the abdomen or pelvis    09/24/2015 Pathology Results    Adenocarcinoma    09/24/2015 Initial Diagnosis    Rectal cancer ([place])    09/24/2015 Procedure    COLONOSCOPY: Nonobsructive mass in rectosigmoid colon from 10-20 cum, circumferential (Dr. Collene Mares)    09/24/2015 Tumor Marker    CEA=4.1    09/25/2015 Imaging    CT CHEST: Mass within the upper-outer quadrant of the left breast warranting further evaluation. No suspicious pulmonary abnormality    10/25/2015 - 12/01/2015 Radiation Therapy    adjuvant irradiation to rectal cancer    10/25/2015 - 12/01/2015 Chemotherapy    Xeloda 1500 mg twice daily, with concurrent irradiation    01/31/2016 Surgery    Robot assisted low anterior resection of rectal cancer     01/31/2016 Pathology Results    Invasive colorectal adenocarcinoma, 3.5cm, G2, LVI(-), peri-neural invasion (-), ypT3, margins negative, 17 nodes all negative     03/11/2016 - 06/14/2016 Chemotherapy    Adjuvant Xeloda 2000 mg in a.m., 1500 mg a evening, 2 weeks on and one-week off, a total 4-5 cycles.     10/18/2016 Imaging    CT CAP  10/18/16 IMPRESSION: Status post low anterior resection with radiation changes in the presacral region.  No findings suspicious for recurrent or metastatic disease.    01/31/2017 Mammogram    IMPRESSION: No mammographic evidence of malignancy. A result letter of this screening mammogram will be mailed directly to the patient.    05/23/2017 Procedure    05/23/2017 Colonoscopy - One 3 mm polyp in the transverse colon, removed with a cold snare. Resected and retrieved. - Normal colo-colonic anastomosis about 4cm from the anal verge. - The examination was otherwise normal on direct and retroflexion views.    10/31/2017 Imaging    10/31/2017 CT CAP IMPRESSION: Stable post treatment changes in perirectal and presacral region. No evidence recurrent or metastatic carcinoma.  Stable small uterine fibroids   ' HISTORY OF PRESENTING ILLNESS:  Karen Good 62 y.o. female is here because of her newly diagnosed rectal cancer. She presents to my clinic with her husband.  She had had intermittent bloody stool for one month, somewhat mount, mixed with stool, with worsening fatigue, and 6 lbs weight loss, no significant abdominal pain, nausea, dyspnea, or other symptoms. She was seen by her PCP Dr. Jenny Reichmann and lab work showed anemia. She was sent to ED on 6/9 and received blood transfusion for Hb 6.9. She underwent a colonoscopy which showed a partially obstructive proximal rectal mass, biopsy showed adenocarcinoma. She was subsequently referred to Dr. Ardis Hughs and underwent EUS  which showed a T3 N0 rectal mass. She was seen by Far[facility] Dr. Marcello Moores last week, and is also scheduled to see radiation oncologist Dr. Lisbeth Renshaw tomorrow.  She has felt much better overall after blood transfusion. She did receive IV ferric gluconate 125 mg in the hospital, but is not on oral iron supplements. She feels well overall, has good appetite and energy level, but does feel anxious since her cancer diagnosis.  CURRENT THERAPY:  Surveillance  INTERIM HISTORY:  [name] returns for follow-up. She is here alone. She is feeling well and has no new complaints. She follows up with her Kanno. Her neuropathy has resolved. She denies bleeding from her rectum. Her appetite is good and weight is stable.    MEDICAL HISTORY:  Past Medical History:  Diagnosis Date  . Anemia   . BACK PAIN 04/13/2010  . BACK STRAIN, LUMBAR 04/13/2010  . Cancer ([facility])    rectal  . DIABETES MELLITUS, TYPE II 06/02/2008  . History of blood transfusion   . History of chemotherapy   . History of radiation therapy   . Hx of transfusion of whole blood 09/2015   prior to colon surgery - [facility]  . HYPERLIPIDEMIA 06/02/2008  . HYPERTENSION 06/02/2008    SURGICAL HISTORY: Past Surgical History:  Procedure Laterality Date  . COLONOSCOPY Left 09/24/2015   Procedure: COLONOSCOPY;  Hirano: Juanita Craver, MD;  Location: [facility] ENDOSCOPY;  Service: Endoscopy;  Laterality: Left;  . COLONOSCOPY  09/2015   Dr Collene Mares  . ESOPHAGOGASTRODUODENOSCOPY N/A 09/24/2015   Procedure: ESOPHAGOGASTRODUODENOSCOPY (EGD);  Berry: Juanita Craver, MD;  Location: [facility] ENDOSCOPY;  Service: Endoscopy;  Laterality: N/A;  . EUS N/A 10/05/2015   Procedure: LOWER ENDOSCOPIC ULTRASOUND (EUS);  Nappi: Milus Banister, MD;  Location: [facility] ENDOSCOPY;  Service: Endoscopy;  Laterality: N/A;  . s/p breast biopsy Left 07/2005   benign  . TUBAL LIGATION    . TUBAL LIGATION      SOCIAL HISTORY: Social History   Socioeconomic History  . Marital status: Single    Spouse name: Not on file  . Number of children: 2  . Years of education: Not on file  . Highest education level: Not on file  Occupational History    Employer: [employer]  Social Needs  . Financial resource strain: Not on file  . Food insecurity:    Worry: Not on file    Inability: Not on file  . Transportation needs:    Medical: Not on file    Non-medical: Not on file  Tobacco Use  . Smoking status: Never Smoker    . Smokeless tobacco: Never Used  Substance and Sexual Activity  . Alcohol use: Yes    Comment: socially  . Drug use: No  . Sexual activity: Yes    Birth control/protection: Post-menopausal  Lifestyle  . Physical activity:    Days per week: Not on file    Minutes per session: Not on file  . Stress: Not on file  Relationships  . Social connections:    Talks on phone: Not on file    Gets together: Not on file    Attends religious service: Not on file    Active member of club or organization: Not on file    Attends meetings of clubs or organizations: Not on file    Relationship status: Not on file  . Intimate partner violence:    Fear of current or ex partner: Not on file    Emotionally abused: Not  on file    Physically abused: Not on file    Forced sexual activity: Not on file  Other Topics Concern  . Not on file  Social History Narrative  . Not on file    FAMILY HISTORY: Family History  Problem Relation Age of Onset  . Diabetes Mother   . Heart disease Father   . Diabetes Father   . Hypertension Father   . Diabetes Sister   . Diabetes Other   . Diabetes Brother   . Colon cancer Neg Hx   . Rectal cancer Neg Hx   . Stomach cancer Neg Hx     ALLERGIES:  has No Known Allergies.  MEDICATIONS:  Current Outpatient Medications  Medication Sig Dispense Refill  . amLODipine (NORVASC) 5 MG tablet Take 1 tablet (5 mg total) by mouth daily. 90 tablet 3  . atenolol (TENORMIN) 50 MG tablet Take 1 tablet (50 mg total) by mouth daily. 90 tablet 3  . atorvastatin (LIPITOR) 20 MG tablet Take 1 tablet (20 mg total) by mouth daily. 30 tablet 11  . Blood Glucose Monitoring Suppl (ONE TOUCH ULTRA 2) w/Device KIT Use as directed daily 1 each 0  . glipiZIDE (GLUCOTROL XL) 5 MG 24 hr tablet TAKE 2 TABLETS BY MOUTH EVERY DAY 60 tablet 5  . glucose blood (ONE TOUCH ULTRA TEST) test strip Use as instructed once daily 100 each 12  . hydrochlorothiazide (HYDRODIURIL) 25 MG tablet Take 1  tablet (25 mg total) by mouth daily. 30 tablet 11  . HYDROcodone-acetaminophen (NORCO) 5-325 MG tablet Take 1 tablet by mouth every 6 (six) hours as needed for moderate pain. To last 30 days 30 tablet 0  . ibuprofen (ADVIL,MOTRIN) 200 MG tablet Take 400 mg by mouth every 6 (six) hours as needed for mild pain.    Marland Kitchen irbesartan (AVAPRO) 300 MG tablet Take 1 tablet (300 mg total) by mouth at bedtime. 90 tablet 3  . irbesartan (AVAPRO) 300 MG tablet TAKE 1 TABLET (300 MG TOTAL) BY MOUTH AT BEDTIME. 90 tablet 3  . JANUMET 50-1000 MG tablet TAKE 1 TABLET TWICE A DAY 180 tablet 3  . KLOR-CON M20 20 MEQ tablet TAKE 2 TABLETS (40 MEQ TOTAL) BY MOUTH DAILY. 60 tablet 2  . Lancets Misc. MISC Use as directed once daily 100 each 11  . pioglitazone (ACTOS) 45 MG tablet TAKE 1 TABLET (45 MG TOTAL) BY MOUTH DAILY. 30 tablet 8   No current facility-administered medications for this visit.     REVIEW OF SYSTEMS:  Constitutional: Denies fevers, chills or abnormal night sweats Eyes: Denies blurriness of vision, double vision or watery eyes Ears, nose, mouth, throat, and face: Denies mucositis or sore throat Respiratory: Denies cough, dyspnea or wheezes Cardiovascular: Denies palpitation, chest discomfort or lower extremity swelling Gastrointestinal:  Denies nausea, heartburn or change in bowel habits Skin: Denies abnormal skin rashes Lymphatics: Denies new lymphadenopathy or easy bruising Neurological:Denies numbness, tingling or new weaknesses Behavioral/Psych: Mood is stable, no new changes  All other systems were reviewed with the patient and are negative.   PHYSICAL EXAMINATION: ECOG PERFORMANCE STATUS: 0 - Asymptomatic  Vitals:   11/21/17 1337  BP: (!) 182/76  Pulse: 97  Resp: 19  Temp: 97.6 F (36.4 C)  SpO2: 100%   Filed Weights   11/21/17 1337  Weight: 181 lb 11.2 oz (82.4 kg)     GENERAL:alert, no distress and comfortable SKIN: skin color, texture, turgor are normal, no rashes or  significant lesions EYES:  normal, conjunctiva are pink and non-injected, sclera clear OROPHARYNX:no exudate, no erythema and lips, buccal mucosa, and tongue normal  NECK: supple, thyroid normal size, non-tender, without nodularity LYMPH:  no palpable lymphadenopathy in the cervical, axillary or inguinal LUNGS: clear to auscultation and percussion with normal breathing effort HEART: regular rate & rhythm, 2/6 systolic murmurs, no lower extremity edema ABDOMEN:abdomen soft, non-tender and normal bowel sounds, no organomegaly. Rectal exam was normal Musculoskeletal:no cyanosis of digits and no clubbing  PSYCH: alert & oriented x 3 with fluent speech NEURO: no focal motor/sensory deficits Rectal exam: pt declined   LABORATORY DATA:  I have reviewed the data as listed CBC Latest Ref Rng & Units 10/31/2017 04/25/2017 10/18/2016  WBC 3.9 - 10.3 K/uL 5.0 5.6 5.6  Hemoglobin 11.6 - 15.9 g/dL 12.1 11.7 12.3  Hematocrit 34.8 - 46.6 % 37.3 36.7 37.5  Platelets 145 - 400 K/uL 280 295 273   CMP Latest Ref Rng & Units 10/31/2017 04/25/2017 10/18/2016  Glucose 70 - 99 mg/dL 138(H) 84 101  BUN 8 - 23 mg/dL 11 14 11.2  Creatinine 0.44 - 1.00 mg/dL 0.71 0.73 0.7  Sodium 135 - 145 mmol/L 141 142 139  Potassium 3.5 - 5.1 mmol/L 3.6 3.0(LL) 3.1(L)  Chloride 98 - 111 mmol/L 102 103 -  CO2 22 - 32 mmol/L '28 25 26  '$ Calcium 8.9 - 10.3 mg/dL 10.2 10.0 10.2  Total Protein 6.5 - 8.1 g/dL 7.8 7.7 7.4  Total Bilirubin 0.3 - 1.2 mg/dL 1.0 1.0 1.06  Alkaline Phos 38 - 126 U/L 88 85 74  AST 15 - 41 U/L 13(L) 14 14  ALT 0 - 44 U/L '15 15 16   '$ Results for LAMIYA, NAAS (MRN [id number]) as of 11/19/2017 17:56  Ref. Range 02/22/2016 15:10 05/31/2016 14:44 10/18/2016 14:24 04/25/2017 14:51 10/31/2017 12:39  CEA ([facility]) Latest Ref Range: 0.00 - 5.00 ng/mL 1.05 1.19 1.71 1.39 1.93     PATHOLOGY REPORT  Diagnosis 09/24/2015 Colon, biopsy, Rectal sigmoid mass - ADENOCARCINOMA. - SEE COMMENT. Microscopic Comment Dr.  Orene Desanctis has reviewed the case and concurs with this interpretation. Dr. Collene Mares was paged on 09/26/15. (JBK:gt, 09/26/15)  Diagnosis 01/31/2016 Colon, segmental resection for tumor, rectosigmoid - INVASIVE COLORECTAL ADENOCARCINOMA, 3.5 CM EXTENDING INTO PERIRECTAL CONNECTIVE TISSUE. - MARGINS NOT INVOLVED. - SEVENTEEN BENIGN LYMPH NODE (0/17). Microscopic Comment COLON AND RECTUM (INCLUDING TRANS-ANAL RESECTION): Specimen: Rectosigmoid colon. Procedure: Resection. Tumor site: Proximal rectum. Specimen integrity: Intact. Macroscopic intactness of mesorectum: Complete. Macroscopic tumor perforation: No. Invasive tumor: Maximum size: 3.5 cm. Histologic type(s): Colorectal adenocarcinoma. Histologic grade and differentiation: G2: moderately differentiated/low grade Type of polyp in which invasive carcinoma arose: No residual polyp. Microscopic extension of invasive tumor: Into perirectal connective tissue. Lymph-Vascular invasion: Not identified. Peri-neural invasion: No. Tumor deposit(s) (discontinuous extramural extension): No Resection margins: Proximal margin: Free of tumor. Distal margin: Free of tumor. Circumferential (radial) (posterior ascending, posterior descending; lateral and posterior mid-rectum; and entire lower 1/3 rectum): Free of tumor. Mesenteric margin (sigmoid and transverse): N/A 1 of 5 Amended copy Amended FINAL for [place], [place] N [id number]) Microscopic Comment(continued) Distance closest margin (if all above margins negative): 3 cm from circumferential radial margin. Treatment effect (neo-adjuvant therapy): Yes. Additional polyp(s): Tubular adenoma with high grade dysplasia. Non-neoplastic findings: N/A Lymph nodes: number examined - 17; number positive: 0 Pathologic Staging: ypT3, ypN0, ypMX Ancillary studies: Microsatellite instability by PCR and mismatch repair protein by immunohistochemistry. (JDP:gt, 02/01/16)  PROCEDURES  05/23/2017 Colonoscopy -  One 3 mm polyp  in the transverse colon, removed with a cold snare. Resected and retrieved. - Normal colo-colonic anastomosis about 4cm from the anal verge. - The examination was otherwise normal on direct and retroflexion views.  EUS 10/05/2015 Dr. Ardis Hughs  IMPRESSION:  Partially circumferential, 6cm long uT3N0 rectosigmoid adenocarcinoma with distal edge located 9cm from the anal verge. Submucosal injections of [place] Ink, at proximal and distal edges of the mass.  Colonoscopy and EGD 09/24/2015 Dr. Collene Mares  - Large circumferental polypoid mass in the rectosigmoid colon from 10-20 cm-biopsies Done. - Normal appearing, widely patent esophagus and GEJ. - Small hiatal hernia noted on retroflexion. - Mild antral gastritis-otherwise normal appearing stomach. - Normal examined duodenum. - No specimens collected.   RADIOGRAPHIC STUDIES: I have personally reviewed the radiological images as listed and agreed with the findings in the report. Ct Chest W Contrast  Result Date: 10/31/2017 CLINICAL DATA:  Follow-up rectal carcinoma. EXAM: CT CHEST, ABDOMEN, AND PELVIS WITH CONTRAST TECHNIQUE: Multidetector CT imaging of the chest, abdomen and pelvis was performed following the standard protocol during bolus administration of intravenous contrast. CONTRAST:  183m ISOVUE-300 IOPAMIDOL (ISOVUE-300) INJECTION 61% COMPARISON:  10/18/2016 FINDINGS: CT CHEST FINDINGS Cardiovascular: No acute findings. Aortic and coronary artery atherosclerosis. Mediastinum/Lymph Nodes: No masses or pathologically enlarged lymph nodes identified. Lungs/Pleura: No pulmonary infiltrate or mass identified. No effusion present. Musculoskeletal:  No suspicious bone lesions identified. CT ABDOMEN AND PELVIS FINDINGS Hepatobiliary: No masses identified. Pancreas:  No mass or inflammatory changes. Spleen:  Within normal limits in size and appearance. Adrenals/Urinary tract:  No masses or hydronephrosis. Stomach/Bowel: Rectal anastomosis  again seen. Mild presacral soft tissue density is stable and consistent with post treatment changes. No evidence of obstruction, inflammatory process, or abnormal fluid collections. Vascular/Lymphatic: No pathologically enlarged lymph nodes identified. No abdominal aortic aneurysm. Aortic atherosclerosis. Reproductive: Several small uterine fibroids again seen, largest in left posterior fundal region measuring approximately 3.7 cm. Adnexal regions are unremarkable. Other:  None. Musculoskeletal:  No suspicious bone lesions identified. IMPRESSION: Stable post treatment changes in perirectal and presacral region. No evidence recurrent or metastatic carcinoma. Stable small uterine fibroids. Electronically Signed   By: JEarle GellM.D.   On: 10/31/2017 16:33   Ct Abdomen Pelvis W Contrast  Result Date: 10/31/2017 CLINICAL DATA:  Follow-up rectal carcinoma. EXAM: CT CHEST, ABDOMEN, AND PELVIS WITH CONTRAST TECHNIQUE: Multidetector CT imaging of the chest, abdomen and pelvis was performed following the standard protocol during bolus administration of intravenous contrast. CONTRAST:  1065mISOVUE-300 IOPAMIDOL (ISOVUE-300) INJECTION 61% COMPARISON:  10/18/2016 FINDINGS: CT CHEST FINDINGS Cardiovascular: No acute findings. Aortic and coronary artery atherosclerosis. Mediastinum/Lymph Nodes: No masses or pathologically enlarged lymph nodes identified. Lungs/Pleura: No pulmonary infiltrate or mass identified. No effusion present. Musculoskeletal:  No suspicious bone lesions identified. CT ABDOMEN AND PELVIS FINDINGS Hepatobiliary: No masses identified. Pancreas:  No mass or inflammatory changes. Spleen:  Within normal limits in size and appearance. Adrenals/Urinary tract:  No masses or hydronephrosis. Stomach/Bowel: Rectal anastomosis again seen. Mild presacral soft tissue density is stable and consistent with post treatment changes. No evidence of obstruction, inflammatory process, or abnormal fluid collections.  Vascular/Lymphatic: No pathologically enlarged lymph nodes identified. No abdominal aortic aneurysm. Aortic atherosclerosis. Reproductive: Several small uterine fibroids again seen, largest in left posterior fundal region measuring approximately 3.7 cm. Adnexal regions are unremarkable. Other:  None. Musculoskeletal:  No suspicious bone lesions identified. IMPRESSION: Stable post treatment changes in perirectal and presacral region. No evidence recurrent or metastatic carcinoma. Stable small uterine fibroids. Electronically  Signed   By: Earle Gell M.D.   On: 10/31/2017 16:33    10/31/2017 CT CAP IMPRESSION: Stable post treatment changes in perirectal and presacral region. No evidence recurrent or metastatic carcinoma.  Stable small uterine fibroids  Diagnostic mammogram 01/31/17 IMPRESSION: No mammographic evidence of malignancy. A result letter of this screening mammogram will be mailed directly to the patient.  CT CAP w contrast 10/18/16 IMPRESSION: Status post low anterior resection with radiation changes in the presacral region. No findings suspicious for recurrent or metastatic disease.  Diagnostic mammogram of left breast 12/12/2015 IMPRESSION: The asymmetry in the upper-outer quadrant of the left breast corresponds with the finding on the patient's CT. This has been stable well over 2 years (since 2011), consistent with a benign finding. RECOMMENDATION: Screening mammogram in one year.(Code:SM-B-01Y) I have discussed the findings and recommendations with the patient. Results were also provided in writing at the conclusion of the visit. If applicable, a reminder letter will be sent to the patient regarding the next appointment. BI-RADS CATEGORY  2: Benign.    ASSESSMENT & PLAN:  62 y.o. female with past medical history of diabetes and hypertension, presented with rectal bleeding. Colonoscopy showed a large proximal rectal mass.  1. Rectal adenocarcinoma, proximal rectum,  uT3N0M0, stage IIA, ypT3N0M0  -I have previously reviewed her colonoscopy, EUS, and CT of abdomen pelvis finding with pt in details -The EUS showed a T3 lesion, no suspicious lymphadenopathy. The CT scan showed no evidence of adenopathy or distant metastasis. She has stage IIA disease -We previously reviewed the natural history of rectal cancer and risk of recurrence after surgery. Given the stage II a disease, she likely has moderate risk of recurrence. We discussed the treatment option for stage IIA rectal cancer. The standard of care is neoadjuvant chemotherapy and radiation, followed by surgery, then adjuvant chemotherapy. -She completed neoadjuvant chemoradiation, tolerated it very well -I previously discussed her surgical pathology findings with patient and her husband in details, she had minimal response to neoadjuvant chemotherapy and radiation, still has residual T3 disease, no lymph node metastasis. -We previously discussed the role of adjuvant chemotherapy in rectal cancer, given the significant residual disease, I recommended adjuvant Xeloda '2000mg'$ /m2 in the morning and '1500mg'$ /m2 in the evening for 14 days, then 7 days off, for 5 cycles, which she has completed.  -we discussed her surveillance CT scan from 10/31/2017, which showed no evidence of recurrence. -She is clinically doing well, exam is unremarkable. Lab reviewed. Her anemia has resolved. No clinical concern for recurrence. We'll continue surveillance -It has been more than 2 years since her initial diagnosis, her risk of recurrence has significantly decreased -we again discussed surveillance and  will f/u with her in 6 months with a new scan.   -Encouraged her to continue healthy diet, and exercise.  - Follow up in 6 months  2. HTN and DM -Continue medication. She'll follow-up with her primary care physician -I again discussed the impact of chemotherapy on her blood pressure and diabetes, we'll monitor her blood pressure and  glucose level closely, and is just the medication if needed. -Her blood pressure has been high lately, but close to normal at home. I encouraged her to monitor at home and follow-up with her primary care physician -Continued to recommend low salt diet and exercise not just walking but being more active on the weekends.   3. Left breast mass -Previous CT chest revealed a left breast mass, however her screening mammogram was negative in 09/2015 -Her  diagnostic mammogram showed the left breast lesion has been stable for a few years, likely benign, no biopsy was recommended. -Mammogram on 01/31/17 revealed no mammographic evidence of malignancy. Continue annual screening mammogram.  4. Hypokalemia  -persistent, likely related to her HTN meds HCTZ  -She will continue oral potassium supplement    PLAN Her recent PET scan and lab results reviewed, no evidence of recurrence.   F/u and labs in 6 months  All questions were answered. The patient knows to call the clinic with any problems, questions or concerns.  I spent 20 minutes counseling the patient face to face. The total time spent in the appointment was 25 minutes and more than 50% was on counseling.  Dierdre Searles Dweik am acting as scribe for Dr. [name] Merle.  I have reviewed the above documentation for accuracy and completeness, and I agree with the above.     [name] Merle, MD 11/21/2017

## 2017-11-21 ENCOUNTER — Encounter: Payer: Self-pay | Admitting: Hematology

## 2017-11-21 ENCOUNTER — Telehealth: Payer: Self-pay | Admitting: Hematology

## 2017-11-21 ENCOUNTER — Inpatient Hospital Stay: Payer: 59 | Attending: Hematology | Admitting: Hematology

## 2017-11-21 VITALS — BP 182/76 | HR 97 | Temp 97.6°F | Resp 19 | Ht 65.0 in | Wt 181.7 lb

## 2017-11-21 DIAGNOSIS — D500 Iron deficiency anemia secondary to blood loss (chronic): Secondary | ICD-10-CM

## 2017-11-21 DIAGNOSIS — I10 Essential (primary) hypertension: Secondary | ICD-10-CM

## 2017-11-21 DIAGNOSIS — E876 Hypokalemia: Secondary | ICD-10-CM | POA: Diagnosis not present

## 2017-11-21 DIAGNOSIS — E119 Type 2 diabetes mellitus without complications: Secondary | ICD-10-CM

## 2017-11-21 DIAGNOSIS — C19 Malignant neoplasm of rectosigmoid junction: Secondary | ICD-10-CM

## 2017-11-21 DIAGNOSIS — C20 Malignant neoplasm of rectum: Secondary | ICD-10-CM

## 2017-11-21 NOTE — Telephone Encounter (Signed)
Appts scheduled AVS Calendar printed per 8/9 los

## 2017-11-26 ENCOUNTER — Telehealth: Payer: Self-pay | Admitting: Internal Medicine

## 2017-11-26 MED ORDER — TELMISARTAN 80 MG PO TABS
80.0000 mg | ORAL_TABLET | Freq: Every day | ORAL | 3 refills | Status: DC
Start: 2017-11-26 — End: 2018-10-12

## 2017-11-26 NOTE — Telephone Encounter (Signed)
Ok to let pt know the avapro was changed to micardis 80 mg due to avapro not available, but this should work about the same

## 2017-11-26 NOTE — Telephone Encounter (Signed)
Called pt, LVM with details below  

## 2017-12-02 ENCOUNTER — Other Ambulatory Visit: Payer: Self-pay | Admitting: Internal Medicine

## 2017-12-05 ENCOUNTER — Other Ambulatory Visit: Payer: Self-pay

## 2017-12-05 ENCOUNTER — Ambulatory Visit (INDEPENDENT_AMBULATORY_CARE_PROVIDER_SITE_OTHER): Payer: 59 | Admitting: Internal Medicine

## 2017-12-05 ENCOUNTER — Encounter: Payer: Self-pay | Admitting: Internal Medicine

## 2017-12-05 DIAGNOSIS — E785 Hyperlipidemia, unspecified: Secondary | ICD-10-CM | POA: Diagnosis not present

## 2017-12-05 DIAGNOSIS — I10 Essential (primary) hypertension: Secondary | ICD-10-CM | POA: Diagnosis not present

## 2017-12-05 DIAGNOSIS — E119 Type 2 diabetes mellitus without complications: Secondary | ICD-10-CM

## 2017-12-05 LAB — POCT GLYCOSYLATED HEMOGLOBIN (HGB A1C): Hemoglobin A1C: 6.9 % — AB (ref 4.0–5.6)

## 2017-12-05 NOTE — Addendum Note (Signed)
Addended by: Marcina Millard on: 12/05/2017 02:54 PM   Modules accepted: Orders

## 2017-12-05 NOTE — Assessment & Plan Note (Signed)
stable overall by history and exam, recent data reviewed with pt, and pt to continue medical treatment as before,  to f/u any worsening symptoms or concerns  

## 2017-12-05 NOTE — Progress Notes (Addendum)
Subjective:    Patient ID: Karen Good, female    DOB: [birth year], 62 y.o.   MRN: [id number]  HPI  Here to f/u; overall doing ok,  Pt denies chest pain, increasing sob or doe, wheezing, orthopnea, PND, [name] though has some chronic venous insufficiency and does not like to wear the compression stockings, and no  palpitations, dizziness or syncope.  Pt denies new neurological symptoms such as new headache, or facial or extremity weakness or numbness.  Pt denies polydipsia, polyuria, or low sugar episode.  Pt states overall good compliance with meds, mostly trying to follow appropriate diet, with wt overall stable,  but little exercise however.   CBG's in low 100s at home and BP< 140/90 at home  No new complaints Past Medical History:  Diagnosis Date  . Anemia   . BACK PAIN 04/13/2010  . BACK STRAIN, LUMBAR 04/13/2010  . Cancer (HCC)    rectal  . DIABETES MELLITUS, TYPE II 06/02/2008  . History of blood transfusion   . History of chemotherapy   . History of radiation therapy   . Hx of transfusion of whole blood 09/2015   prior to colon surgery - [facility]  . HYPERLIPIDEMIA 06/02/2008  . HYPERTENSION 06/02/2008   Past Surgical History:  Procedure Laterality Date  . COLONOSCOPY Left 09/24/2015   Procedure: COLONOSCOPY;  Johansson: Juanita Craver, MD;  Location: [facility] ENDOSCOPY;  Service: Endoscopy;  Laterality: Left;  . COLONOSCOPY  09/2015   Dr Collene Mares  . ESOPHAGOGASTRODUODENOSCOPY N/A 09/24/2015   Procedure: ESOPHAGOGASTRODUODENOSCOPY (EGD);  Carvell: Juanita Craver, MD;  Location: [facility] ENDOSCOPY;  Service: Endoscopy;  Laterality: N/A;  . EUS N/A 10/05/2015   Procedure: LOWER ENDOSCOPIC ULTRASOUND (EUS);  Kueker: Milus Banister, MD;  Location: [facility] ENDOSCOPY;  Service: Endoscopy;  Laterality: N/A;  . s/p breast biopsy Left 07/2005   benign  . TUBAL LIGATION    . TUBAL LIGATION      reports that she has never smoked. She has never used smokeless tobacco. She reports that she drinks  alcohol. She reports that she does not use drugs. family history includes Diabetes in her brother, father, mother, other, and sister; Heart disease in her father; Hypertension in her father. No Known Allergies Current Outpatient Medications on File Prior to Visit  Medication Sig Dispense Refill  . amLODipine (NORVASC) 5 MG tablet Take 1 tablet (5 mg total) by mouth daily. 90 tablet 3  . atenolol (TENORMIN) 50 MG tablet Take 1 tablet (50 mg total) by mouth daily. 90 tablet 3  . atorvastatin (LIPITOR) 20 MG tablet Take 1 tablet (20 mg total) by mouth daily. 30 tablet 11  . Blood Glucose Monitoring Suppl (ONE TOUCH ULTRA 2) w/Device KIT Use as directed daily 1 each 0  . glipiZIDE (GLUCOTROL XL) 5 MG 24 hr tablet TAKE 2 TABLETS BY MOUTH EVERY DAY 60 tablet 5  . glucose blood (ONE TOUCH ULTRA TEST) test strip USE AS INSTRUCTED ONCE DAILY 100 each 12  . hydrochlorothiazide (HYDRODIURIL) 25 MG tablet Take 1 tablet (25 mg total) by mouth daily. 30 tablet 11  . HYDROcodone-acetaminophen (NORCO) 5-325 MG tablet Take 1 tablet by mouth every 6 (six) hours as needed for moderate pain. To last 30 days 30 tablet 0  . ibuprofen (ADVIL,MOTRIN) 200 MG tablet Take 400 mg by mouth every 6 (six) hours as needed for mild pain.    Marland Kitchen JANUMET 50-1000 MG tablet TAKE 1 TABLET TWICE A DAY 180 tablet 3  .  KLOR-CON M20 20 MEQ tablet TAKE 2 TABLETS (40 MEQ TOTAL) BY MOUTH DAILY. 60 tablet 2  . Lancets Misc. MISC Use as directed once daily 100 each 11  . pioglitazone (ACTOS) 45 MG tablet TAKE 1 TABLET (45 MG TOTAL) BY MOUTH DAILY. 30 tablet 8  . telmisartan (MICARDIS) 80 MG tablet Take 1 tablet (80 mg total) by mouth daily. 90 tablet 3  . [DISCONTINUED] nebivolol (BYSTOLIC) 10 MG tablet Take 1 tablet (10 mg total) by mouth daily. 90 tablet 3  . [DISCONTINUED] valsartan (DIOVAN) 320 MG tablet Take 1 tablet (320 mg total) by mouth daily. 90 tablet 3   No current facility-administered medications on file prior to visit.     Review of Systems  Constitutional: Negative for other unusual diaphoresis or sweats HENT: Negative for ear discharge or swelling Eyes: Negative for other worsening visual disturbances Respiratory: Negative for stridor or other swelling  Gastrointestinal: Negative for worsening distension or other blood Genitourinary: Negative for retention or other urinary change Musculoskeletal: Negative for other MSK pain or swelling Skin: Negative for color change or other new lesions Neurological: Negative for worsening tremors and other numbness  Psychiatric/Behavioral: Negative for worsening agitation or other fatigue All other system neg per pt    Objective:   Physical Exam BP (!) 150/70 (BP Location: Right Arm, Patient Position: Sitting, Cuff Size: Normal)   Pulse 95   Temp 97.8 F (36.6 C) (Oral)   Ht '5\' 5"'$  (1.651 m)   Wt 184 lb 3.2 oz (83.6 kg)   SpO2 97%   BMI 30.65 kg/m  VS noted,  Constitutional: Pt appears in NAD HENT: Head: NCAT.  Right Ear: External ear normal.  Left Ear: External ear normal.  Eyes: . Pupils are equal, round, and reactive to light. Conjunctivae and EOM are normal Nose: without d/c or deformity Neck: Neck supple. Gross normal ROM Cardiovascular: Normal rate and regular rhythm.   Pulmonary/Chest: Effort normal and breath sounds without rales or wheezing.  Abd:  Soft, NT, ND, + BS, no organomegaly Neurological: Pt is alert. At baseline orientation, motor grossly intact Skin: Skin is warm. No rashes, other new lesions, no LE edema Psychiatric: Pt behavior is normal without agitation  No other exam findings Lab Results  Component Value Date   WBC 5.0 10/31/2017   HGB 12.1 10/31/2017   HCT 37.3 10/31/2017   PLT 280 10/31/2017   GLUCOSE 138 (H) 10/31/2017   CHOL 152 06/06/2017   TRIG 118.0 06/06/2017   HDL 53.30 06/06/2017   LDLCALC 75 06/06/2017   ALT 15 10/31/2017   AST 13 (L) 10/31/2017   NA 141 10/31/2017   K 3.6 10/31/2017   CL 102 10/31/2017    CREATININE 0.71 10/31/2017   BUN 11 10/31/2017   CO2 28 10/31/2017   TSH 1.50 06/06/2017   HGBA1C 6.3 03/20/2016   MICROALBUR 8.3 (H) 09/22/2015      Assessment & Plan:  Medical screening examination/treatment/procedure(s) were performed by non-physician practitioner and as supervising physician I was immediately available for consultation/collaboration. I agree with above. Cathlean Cower, MD

## 2017-12-05 NOTE — Patient Instructions (Signed)
Your A1c was OK today  Please continue all other medications as before, and refills have been done if requested.  Please have the pharmacy call with any other refills you may need.  Please continue your efforts at being more active, low cholesterol diabetic diet, and weight control.  Please keep your appointments with your specialists as you may have planned  Please return in 6 months, or sooner if needed, with Lab testing done 3-5 days before

## 2017-12-05 NOTE — Assessment & Plan Note (Signed)
stable overall by history and exam, recent data reviewed with pt, and pt to continue medical treatment as before,  to f/u any worsening symptoms or concerns, for f/u a1c today 

## 2017-12-12 ENCOUNTER — Ambulatory Visit: Payer: Self-pay | Admitting: Hematology

## 2018-01-06 DIAGNOSIS — Z23 Encounter for immunization: Secondary | ICD-10-CM | POA: Diagnosis not present

## 2018-01-17 ENCOUNTER — Other Ambulatory Visit: Payer: Self-pay | Admitting: Nurse Practitioner

## 2018-01-17 DIAGNOSIS — E876 Hypokalemia: Secondary | ICD-10-CM

## 2018-01-17 DIAGNOSIS — C20 Malignant neoplasm of rectum: Secondary | ICD-10-CM

## 2018-01-29 ENCOUNTER — Other Ambulatory Visit: Payer: Self-pay | Admitting: Hematology

## 2018-01-29 DIAGNOSIS — Z1231 Encounter for screening mammogram for malignant neoplasm of breast: Secondary | ICD-10-CM

## 2018-03-06 ENCOUNTER — Ambulatory Visit: Payer: 59

## 2018-03-06 ENCOUNTER — Ambulatory Visit
Admission: RE | Admit: 2018-03-06 | Discharge: 2018-03-06 | Disposition: A | Discharge status: Home / Self Care | Payer: 59 | Source: Ambulatory Visit | Attending: Hematology | Admitting: Hematology

## 2018-03-06 DIAGNOSIS — Z1231 Encounter for screening mammogram for malignant neoplasm of breast: Secondary | ICD-10-CM

## 2018-03-23 DIAGNOSIS — Z85048 Personal history of other malignant neoplasm of rectum, rectosigmoid junction, and anus: Secondary | ICD-10-CM | POA: Diagnosis not present

## 2018-04-18 ENCOUNTER — Other Ambulatory Visit: Payer: Self-pay | Admitting: Internal Medicine

## 2018-04-19 ENCOUNTER — Other Ambulatory Visit: Payer: Self-pay | Admitting: Internal Medicine

## 2018-04-28 ENCOUNTER — Other Ambulatory Visit: Payer: Self-pay | Admitting: Internal Medicine

## 2018-04-28 MED ORDER — SITAGLIPTIN PHOS-METFORMIN HCL 50-1000 MG PO TABS: 1.0000 | ORAL_TABLET | Freq: Two times a day (BID) | ORAL | 0 refills | Status: DC

## 2018-04-28 NOTE — Telephone Encounter (Signed)
Copied from [place] [id number]. Topic: Quick Communication - Rx Refill/Question >> Apr 28, 2018 11:29 AM Bea Graff, NT wrote: Medication: JANUMET 50-1000 MG tablet   Has the patient contacted their pharmacy? Yes.   (Agent: If no, request that the patient contact the pharmacy for the refill.) (Agent: If yes, when and what did the pharmacy advise?)  Preferred Pharmacy (with phone number or street name): [id number] [address], [place]. [phone] (Phone) [phone] (Fax)    Agent: Please be advised that RX refills may take up to 3 business days. We ask that you follow-up with your pharmacy.

## 2018-04-28 NOTE — Telephone Encounter (Signed)
Requested Prescriptions  Pending Prescriptions Disp Refills  . sitaGLIPtin-metformin (JANUMET) 50-1000 MG tablet 180 tablet 0    Sig: Take 1 tablet by mouth 2 (two) times daily.     Endocrinology:  Diabetes - Biguanide + DPP-4 Inhibitor Combos Passed - 04/28/2018 11:42 AM      Passed - HBA1C is between 0 and 7.9 and within 180 days    Hemoglobin A1C  Date Value Ref Range Status  12/05/2017 6.9 (A) 4.0 - 5.6 % Final   Hgb A1c MFr Bld  Date Value Ref Range Status  03/20/2016 6.3 4.6 - 6.5 % Final    Comment:    Glycemic Control Guidelines for People with Diabetes:Non Diabetic:  <6%Goal of Therapy: <7%Additional Action Suggested:  >8%          Passed - Cr in normal range and within 360 days    Creatinine  Date Value Ref Range Status  10/18/2016 0.7 0.6 - 1.1 mg/dL Final   Creatinine, Ser  Date Value Ref Range Status  10/31/2017 0.71 0.44 - 1.00 mg/dL Final         Passed - eGFR in normal range and within 360 days    GFR calc Af Amer  Date Value Ref Range Status  10/31/2017 >60 >60 mL/min Final    Comment:    (NOTE) The eGFR has been calculated using the CKD EPI equation. This calculation has not been validated in all clinical situations. eGFR's persistently <60 mL/min signify possible Chronic Kidney Disease.    GFR calc non Af Amer  Date Value Ref Range Status  10/31/2017 >60 >60 mL/min Final   GFR  Date Value Ref Range Status  03/20/2016 126.81 >60.00 mL/min Final         Passed - Valid encounter within last 6 months    Recent Outpatient Visits          4 months ago Type 2 diabetes mellitus without complication, without long-term current use of insulin (Exira)   [facility] -Georges Mouse, MD   10 months ago Encounter for preventative adult health care exam with abnormal findings   [place] John, James W, MD   2 years ago Type 2 diabetes mellitus without complication, without long-term current use of insulin  [facility])   [facility] -Georges Mouse, MD   2 years ago Encounter for preventative adult health care exam with abnormal findings   [place] John, Hunt Oris, MD   3 years ago Preventative health care   [facility] -Georges Mouse, MD      Future Appointments            In 1 month Jenny Reichmann, Hunt Oris, MD [place], [facility]

## 2018-05-17 ENCOUNTER — Other Ambulatory Visit: Payer: Self-pay | Admitting: Internal Medicine

## 2018-05-17 DIAGNOSIS — C20 Malignant neoplasm of rectum: Secondary | ICD-10-CM

## 2018-05-17 DIAGNOSIS — E876 Hypokalemia: Secondary | ICD-10-CM

## 2018-05-20 ENCOUNTER — Other Ambulatory Visit: Payer: Self-pay | Admitting: Internal Medicine

## 2018-05-26 ENCOUNTER — Other Ambulatory Visit: Payer: Self-pay | Admitting: Internal Medicine

## 2018-06-03 NOTE — Progress Notes (Signed)
[place]   [phone]) [phone] [phone]) [phone]   Clinic Follow up Note   Patient Care Team: Biagio Borg, MD as PCP - Eber Jones, MD as Consulting Physician (Radiation Oncology) Leighton Ruff, MD as Consulting Physician (General Surgery) Milus Banister, MD as Attending Physician (Gastroenterology) Tania Ade, RN as Registered Nurse  Date of Service:  06/05/2018  CHIEF COMPLAINT: Follow up rectal cancer  SUMMARY OF ONCOLOGIC HISTORY: Oncology History   Rectal cancer [facility])   Staging form: Colon and Rectum, AJCC 7th Edition   - Clinical: Stage IIA (T3, N0, M0) - Signed by [name] Merle, MD on 10/10/2015   - Pathologic stage from 01/31/2016: Stage IIA (T3, N0, cM0) - Signed by [name] Merle, MD on 02/22/2016       Rectal cancer ([place])   09/22/2015 Imaging    CT ABD/PELVIS: Soft tissue fullness at the rectosigmoid junction. Otherwise, no acute process in the abdomen or pelvis    09/24/2015 Pathology Results    Adenocarcinoma    09/24/2015 Initial Diagnosis    Rectal cancer ([place])    09/24/2015 Procedure    COLONOSCOPY: Nonobsructive mass in rectosigmoid colon from 10-20 cum, circumferential (Dr. Collene Mares)    09/24/2015 Tumor Marker    CEA=4.1    09/25/2015 Imaging    CT CHEST: Mass within the upper-outer quadrant of the left breast warranting further evaluation. No suspicious pulmonary abnormality    10/25/2015 - 12/01/2015 Radiation Therapy    adjuvant irradiation to rectal cancer    10/25/2015 - 12/01/2015 Chemotherapy    Xeloda 1500 mg twice daily, with concurrent irradiation    01/31/2016 Surgery    Robot assisted low anterior resection of rectal cancer     01/31/2016 Pathology Results    Invasive colorectal adenocarcinoma, 3.5cm, G2, LVI(-), peri-neural invasion (-), ypT3, margins negative, 17 nodes all negative     03/11/2016 - 06/14/2016 Chemotherapy    Adjuvant Xeloda 2000 mg in a.m., 1500 mg a evening, 2 weeks on and one-week off, a total 4-5  cycles.     10/18/2016 Imaging    CT CAP 10/18/16 IMPRESSION: Status post low anterior resection with radiation changes in the presacral region.  No findings suspicious for recurrent or metastatic disease.    01/31/2017 Mammogram    IMPRESSION: No mammographic evidence of malignancy. A result letter of this screening mammogram will be mailed directly to the patient.    05/23/2017 Procedure    05/23/2017 Colonoscopy - One 3 mm polyp in the transverse colon, removed with a cold snare. Resected and retrieved. - Normal colo-colonic anastomosis about 4cm from the anal verge. - The examination was otherwise normal on direct and retroflexion views.    10/31/2017 Imaging    10/31/2017 CT CAP IMPRESSION: Stable post treatment changes in perirectal and presacral region. No evidence recurrent or metastatic carcinoma.  Stable small uterine fibroids      CURRENT THERAPY:  Surveillance  INTERVAL HISTORY:  Karen Good is here for a follow up of rectal cancer. She was last seen by me 6 months ago. She presents to the clinic today by herself. She notes she has been doing well. She has no new news. She would like to lose weight. She is currently on a diet.  She denies abdominal or bowel movement issues. She denies blood in stool or chest discomfort.  She notes she retired from working this week.    REVIEW OF SYSTEMS:   Constitutional: Denies fevers, chills or abnormal weight  loss Eyes: Denies blurriness of vision Ears, nose, mouth, throat, and face: Denies mucositis or sore throat Respiratory: Denies cough, dyspnea or wheezes Cardiovascular: Denies palpitation, chest discomfort or lower extremity swelling Gastrointestinal:  Denies nausea, heartburn or change in bowel habits Skin: Denies abnormal skin rashes Lymphatics: Denies new lymphadenopathy or easy bruising Neurological:Denies numbness, tingling or new weaknesses Behavioral/Psych: Mood is stable, no new changes  All other systems  were reviewed with the patient and are negative.  MEDICAL HISTORY:  Past Medical History:  Diagnosis Date  . Anemia   . BACK PAIN 04/13/2010  . BACK STRAIN, LUMBAR 04/13/2010  . Cancer (HCC)    rectal  . DIABETES MELLITUS, TYPE II 06/02/2008  . History of blood transfusion   . History of chemotherapy   . History of radiation therapy   . Hx of transfusion of whole blood 09/2015   prior to colon surgery - [facility]  . HYPERLIPIDEMIA 06/02/2008  . HYPERTENSION 06/02/2008    SURGICAL HISTORY: Past Surgical History:  Procedure Laterality Date  . COLONOSCOPY Left 09/24/2015   Procedure: COLONOSCOPY;  [place]ink: Juanita Craver, MD;  Location: [facility] ENDOSCOPY;  Service: Endoscopy;  Laterality: Left;  . COLONOSCOPY  09/2015   Dr Collene Mares  . ESOPHAGOGASTRODUODENOSCOPY N/A 09/24/2015   Procedure: ESOPHAGOGASTRODUODENOSCOPY (EGD);  Le[place]: Juanita Craver, MD;  Location: [facility] ENDOSCOPY;  Service: Endoscopy;  Laterality: N/A;  . EUS N/A 10/05/2015   Procedure: LOWER ENDOSCOPIC ULTRASOUND (EUS);  Letizia: Milus Banister, MD;  Location: [facility] ENDOSCOPY;  Service: Endoscopy;  Laterality: N/A;  . s/p breast biopsy Left 07/2005   benign  . TUBAL LIGATION    . TUBAL LIGATION      I have reviewed the social history and family history with the patient and they are unchanged from previous note.  ALLERGIES:  has No Known Allergies.  MEDICATIONS:  Current Outpatient Medications  Medication Sig Dispense Refill  . amLODipine (NORVASC) 5 MG tablet Take 1 tablet (5 mg total) by mouth daily. 90 tablet 3  . atenolol (TENORMIN) 50 MG tablet TAKE 1 TABLET BY MOUTH EVERY DAY 90 tablet 1  . atorvastatin (LIPITOR) 20 MG tablet TAKE 1 TABLET BY MOUTH EVERY DAY 90 tablet 0  . Blood Glucose Monitoring Suppl (ONE TOUCH ULTRA 2) w/Device KIT Use as directed daily 1 each 0  . glipiZIDE (GLUCOTROL XL) 5 MG 24 hr tablet TAKE 2 TABLETS BY MOUTH EVERY DAY 180 tablet 0  . glucose blood (ONE TOUCH ULTRA TEST) test strip USE AS INSTRUCTED ONCE  DAILY 100 each 12  . hydrochlorothiazide (HYDRODIURIL) 25 MG tablet TAKE 1 TABLET BY MOUTH EVERY DAY 90 tablet 0  . HYDROcodone-acetaminophen (NORCO) 5-325 MG tablet Take 1 tablet by mouth every 6 (six) hours as needed for moderate pain. To last 30 days 30 tablet 0  . ibuprofen (ADVIL,MOTRIN) 200 MG tablet Take 400 mg by mouth every 6 (six) hours as needed for mild pain.    Marland Kitchen KLOR-CON M20 20 MEQ tablet TAKE 2 TABLETS BY MOUTH EVERY DAY 180 tablet 0  . Lancets Misc. MISC Use as directed once daily 100 each 11  . pioglitazone (ACTOS) 45 MG tablet TAKE 1 TABLET (45 MG TOTAL) BY MOUTH DAILY. 90 tablet 1  . sitaGLIPtin-metformin (JANUMET) 50-1000 MG tablet Take 1 tablet by mouth 2 (two) times daily. 180 tablet 0  . telmisartan (MICARDIS) 80 MG tablet Take 1 tablet (80 mg total) by mouth daily. 90 tablet 3   No current facility-administered medications for this visit.  PHYSICAL EXAMINATION: ECOG PERFORMANCE STATUS: 0 - Asymptomatic  Vitals:   06/05/18 1351 06/05/18 1352  BP: (!) 194/84 (!) 189/79  Pulse: 98   Resp: 20   Temp: 98.2 F (36.8 C)   SpO2: 100%    Filed Weights   06/05/18 1351  Weight: 183 lb (83 kg)    GENERAL:alert, no distress and comfortable SKIN: skin color, texture, turgor are normal, no rashes or significant lesions EYES: normal, Conjunctiva are pink and non-injected, sclera clear OROPHARYNX:no exudate, no erythema and lips, buccal mucosa, and tongue normal  NECK: supple, thyroid normal size, non-tender, without nodularity LYMPH:  no palpable lymphadenopathy in the cervical, axillary or inguinal LUNGS: clear to auscultation and percussion with normal breathing effort HEART: regular rate & rhythm and no murmurs and no lower extremity edema ABDOMEN:abdomen soft, non-tender and normal bowel sounds Musculoskeletal:no cyanosis of digits and no clubbing  NEURO: alert & oriented x 3 with fluent speech, no focal motor/sensory deficits RECTAL: (+) Benign, No palpable  mass. Stool on glove, no blood.   LABORATORY DATA:  I have reviewed the data as listed CBC Latest Ref Rng & Units 06/05/2018 10/31/2017 04/25/2017  WBC 4.0 - 10.5 K/uL 4.6 5.0 5.6  Hemoglobin 12.0 - 15.0 g/dL 11.9(L) 12.1 11.7  Hematocrit 36.0 - 46.0 % 37.4 37.3 36.7  Platelets 150 - 400 K/uL 259 280 295     CMP Latest Ref Rng & Units 06/05/2018 10/31/2017 04/25/2017  Glucose 70 - 99 mg/dL 184(H) 138(H) 84  BUN 8 - 23 mg/dL '14 11 14  '$ Creatinine 0.44 - 1.00 mg/dL 0.77 0.71 0.73  Sodium 135 - 145 mmol/L 142 141 142  Potassium 3.5 - 5.1 mmol/L 3.7 3.6 3.0(LL)  Chloride 98 - 111 mmol/L 104 102 103  CO2 22 - 32 mmol/L '27 28 25  '$ Calcium 8.9 - 10.3 mg/dL 9.6 10.2 10.0  Total Protein 6.5 - 8.1 g/dL 7.3 7.8 7.7  Total Bilirubin 0.3 - 1.2 mg/dL 1.0 1.0 1.0  Alkaline Phos 38 - 126 U/L 77 88 85  AST 15 - 41 U/L 12(L) 13(L) 14  ALT 0 - 44 U/L '12 15 15      '$ RADIOGRAPHIC STUDIES: I have personally reviewed the radiological images as listed and agreed with the findings in the report. No results found.   ASSESSMENT & PLAN:  Marlyne Totaro Flom is a 63 y.o. female with   1. Rectal adenocarcinoma, proximal rectum, uT3N0M0, stage IIA, ypT3N0M0  -She was diagnosed in 09/2015. She is s/p chemoRT, surgical resection and adjuvant Xeloda.  -Her surveillance Colonoscopy was in 05/2017 and was normal except 1 benign polyp. Will repeat in 3 years.  -She is clinically doing well. CBC WNL except HG at 11.9, CMP unremarkable except hyperglycemia. Iron panel is still pending. Her Rectal exam was unremarkable. There is no clinical concern for recurrence.  -Continue surveillance. She is almost 3 years since diagnoses.  Given her stage II disease, I do not plan to rescan her unless concerns of recurrence.  -F/u with me ain 6 months, then yearly. She will f/u with Dr. Marcello Moores in interim.    2. HTN and DM -Continue medication. She'll follow-up with her primary care physician -I recommend low salt diet and exercise  not just walking but being more active on the weekends.  -BP at 194/84 today (06/05/18), not well controlled  -I encouraged her to monitor blood pressure and blood glucose at home, and follow-up with PCP.  3. Cancer screening  -Previous CT chest  revealed a left breast mass, however her screening mammogram was negative in 09/2015 -Her diagnostic mammogram showed the left breast lesion has been stable for a few years, likely benign, no biopsy was recommended. -Mammogram on 02/2018 revealed no mammographic evidence of malignancy. Continue annual screening mammogram.   4. Hypokalemia  -likely related to her HTN meds HCTZ  -K at 3.7 today (06/05/18). She will continue oral potassium supplement    PLAN -She is clinically doing well, no concern for recurrence. -Lab and f/u in 6 months     No problem-specific Assessment & Plan notes found for this encounter.   No orders of the defined types were placed in this encounter.  All questions were answered. The patient knows to call the clinic with any problems, questions or concerns. No barriers to learning was detected. I spent 20 minutes counseling the patient face to face. The total time spent in the appointment was 25 minutes and more than 50% was on counseling and review of test results     [name] Merle, MD 06/05/2018   I, Karen Good, am acting as scribe for [name] Merle, MD.   I have reviewed the above documentation for accuracy and completeness, and I agree with the above.

## 2018-06-05 ENCOUNTER — Encounter: Payer: Self-pay | Admitting: Hematology

## 2018-06-05 ENCOUNTER — Inpatient Hospital Stay: Payer: BLUE CROSS/BLUE SHIELD | Admitting: Hematology

## 2018-06-05 ENCOUNTER — Inpatient Hospital Stay: Payer: BLUE CROSS/BLUE SHIELD | Attending: Hematology

## 2018-06-05 ENCOUNTER — Telehealth: Payer: Self-pay | Admitting: Hematology

## 2018-06-05 VITALS — BP 189/79 | HR 98 | Temp 98.2°F | Resp 20 | Ht 65.0 in | Wt 183.0 lb

## 2018-06-05 DIAGNOSIS — E1165 Type 2 diabetes mellitus with hyperglycemia: Secondary | ICD-10-CM | POA: Insufficient documentation

## 2018-06-05 DIAGNOSIS — D509 Iron deficiency anemia, unspecified: Secondary | ICD-10-CM

## 2018-06-05 DIAGNOSIS — E876 Hypokalemia: Secondary | ICD-10-CM | POA: Insufficient documentation

## 2018-06-05 DIAGNOSIS — C20 Malignant neoplasm of rectum: Secondary | ICD-10-CM

## 2018-06-05 DIAGNOSIS — I10 Essential (primary) hypertension: Secondary | ICD-10-CM | POA: Diagnosis not present

## 2018-06-05 DIAGNOSIS — E119 Type 2 diabetes mellitus without complications: Secondary | ICD-10-CM

## 2018-06-05 LAB — IRON AND TIBC
Iron: 57 ug/dL (ref 41–142)
Saturation Ratios: 14 % — ABNORMAL LOW (ref 21–57)
TIBC: 416 ug/dL (ref 236–444)
UIBC: 359 ug/dL (ref 120–384)

## 2018-06-05 LAB — CBC WITH DIFFERENTIAL/PLATELET
ABS IMMATURE GRANULOCYTES: 0.03 10*3/uL (ref 0.00–0.07)
Basophils Absolute: 0 10*3/uL (ref 0.0–0.1)
Basophils Relative: 0 %
Eosinophils Absolute: 0 10*3/uL (ref 0.0–0.5)
Eosinophils Relative: 0 %
HCT: 37.4 % (ref 36.0–46.0)
Hemoglobin: 11.9 g/dL — ABNORMAL LOW (ref 12.0–15.0)
Immature Granulocytes: 1 %
LYMPHS ABS: 0.9 10*3/uL (ref 0.7–4.0)
Lymphocytes Relative: 19 %
MCH: 27.5 pg (ref 26.0–34.0)
MCHC: 31.8 g/dL (ref 30.0–36.0)
MCV: 86.4 fL (ref 80.0–100.0)
Monocytes Absolute: 0.3 10*3/uL (ref 0.1–1.0)
Monocytes Relative: 7 %
Neutro Abs: 3.4 10*3/uL (ref 1.7–7.7)
Neutrophils Relative %: 73 %
Platelets: 259 10*3/uL (ref 150–400)
RBC: 4.33 MIL/uL (ref 3.87–5.11)
RDW: 14.6 % (ref 11.5–15.5)
WBC: 4.6 10*3/uL (ref 4.0–10.5)
nRBC: 0 % (ref 0.0–0.2)

## 2018-06-05 LAB — COMPREHENSIVE METABOLIC PANEL
ALT: 12 U/L (ref 0–44)
AST: 12 U/L — ABNORMAL LOW (ref 15–41)
Albumin: 4.1 g/dL (ref 3.5–5.0)
Alkaline Phosphatase: 77 U/L (ref 38–126)
Anion gap: 11 (ref 5–15)
BILIRUBIN TOTAL: 1 mg/dL (ref 0.3–1.2)
BUN: 14 mg/dL (ref 8–23)
CO2: 27 mmol/L (ref 22–32)
Calcium: 9.6 mg/dL (ref 8.9–10.3)
Chloride: 104 mmol/L (ref 98–111)
Creatinine, Ser: 0.77 mg/dL (ref 0.44–1.00)
GFR calc Af Amer: >60 mL/min (ref >60)
GFR calc non Af Amer: >60 mL/min (ref >60)
Glucose, Bld: 184 mg/dL — ABNORMAL HIGH (ref 70–99)
Potassium: 3.7 mmol/L (ref 3.5–5.1)
Sodium: 142 mmol/L (ref 135–145)
TOTAL PROTEIN: 7.3 g/dL (ref 6.5–8.1)

## 2018-06-05 LAB — FERRITIN: Ferritin: 68 ng/mL (ref 11–307)

## 2018-06-05 LAB — HM DIABETES EYE EXAM

## 2018-06-05 NOTE — Telephone Encounter (Signed)
Gave avs and calendar ° °

## 2018-06-11 ENCOUNTER — Other Ambulatory Visit: Payer: Self-pay | Admitting: Internal Medicine

## 2018-06-12 ENCOUNTER — Other Ambulatory Visit (INDEPENDENT_AMBULATORY_CARE_PROVIDER_SITE_OTHER): Payer: BLUE CROSS/BLUE SHIELD

## 2018-06-12 ENCOUNTER — Ambulatory Visit: Payer: BLUE CROSS/BLUE SHIELD | Admitting: Internal Medicine

## 2018-06-12 ENCOUNTER — Other Ambulatory Visit: Payer: Self-pay | Admitting: Internal Medicine

## 2018-06-12 ENCOUNTER — Encounter: Payer: Self-pay | Admitting: Internal Medicine

## 2018-06-12 VITALS — BP 126/82 | HR 97 | Temp 98.1°F | Ht 65.0 in | Wt 183.0 lb

## 2018-06-12 DIAGNOSIS — Z0000 Encounter for general adult medical examination without abnormal findings: Secondary | ICD-10-CM

## 2018-06-12 DIAGNOSIS — E119 Type 2 diabetes mellitus without complications: Secondary | ICD-10-CM | POA: Diagnosis not present

## 2018-06-12 DIAGNOSIS — R3129 Other microscopic hematuria: Secondary | ICD-10-CM

## 2018-06-12 LAB — LIPID PANEL
Cholesterol: 150 mg/dL (ref 0–200)
HDL: 49 mg/dL (ref >39.00)
LDL Cholesterol: 64 mg/dL (ref 0–99)
NonHDL: 101.07
Total CHOL/HDL Ratio: 3
Triglycerides: 186 mg/dL — ABNORMAL HIGH (ref 0.0–149.0)
VLDL: 37.2 mg/dL (ref 0.0–40.0)

## 2018-06-12 LAB — URINALYSIS, ROUTINE W REFLEX MICROSCOPIC
BILIRUBIN URINE: NEGATIVE
Ketones, ur: NEGATIVE
LEUKOCYTE UA: NEGATIVE
NITRITE: NEGATIVE
Specific Gravity, Urine: >=1.03 — AB (ref 1.000–1.030)
Total Protein, Urine: NEGATIVE
Urine Glucose: 100 — AB
Urobilinogen, UA: 0.2 (ref 0.0–1.0)
pH: 5 (ref 5.0–8.0)

## 2018-06-12 LAB — MICROALBUMIN / CREATININE URINE RATIO
CREATININE, U: 75.2 mg/dL
Microalb Creat Ratio: 5 mg/g (ref 0.0–30.0)
Microalb, Ur: 3.8 mg/dL — ABNORMAL HIGH (ref 0.0–1.9)

## 2018-06-12 LAB — HEMOGLOBIN A1C: Hgb A1c MFr Bld: 8 % — ABNORMAL HIGH (ref 4.6–6.5)

## 2018-06-12 LAB — TSH: TSH: 1.4 u[IU]/mL (ref 0.35–4.50)

## 2018-06-12 NOTE — Progress Notes (Signed)
Subjective:    Patient ID: Karen Good, female    DOB: [birth year], 63 y.o.   MRN: [id number]  HPI  Here for wellness and f/u;  Overall doing ok;  Pt denies Chest pain, worsening SOB, DOE, wheezing, orthopnea, PND, worsening LE edema, palpitations, dizziness or syncope.  Pt denies neurological change such as new headache, facial or extremity weakness.  Pt denies polydipsia, polyuria, or low sugar symptoms. Pt states overall good compliance with treatment and medications, good tolerability, and has been trying to follow appropriate diet.  Pt denies worsening depressive symptoms, suicidal ideation or panic. No fever, night sweats, wt loss, loss of appetite, or other constitutional symptoms.  Pt states good ability with ADL's, has low fall risk, home safety reviewed and adequate, no other significant changes in hearing or vision, and only occasionally active with exercise. Trying to lose wt with better diet. No new complaints Wt Readings from Last 3 Encounters:  06/12/18 183 lb (83 kg)  06/05/18 183 lb (83 kg)  12/05/17 184 lb 3.2 oz (83.6 kg)   Past Medical History:  Diagnosis Date  . Anemia   . BACK PAIN 04/13/2010  . BACK STRAIN, LUMBAR 04/13/2010  . Cancer (HCC)    rectal  . DIABETES MELLITUS, TYPE II 06/02/2008  . History of blood transfusion   . History of chemotherapy   . History of radiation therapy   . Hx of transfusion of whole blood 09/2015   prior to colon surgery - [facility]  . HYPERLIPIDEMIA 06/02/2008  . HYPERTENSION 06/02/2008   Past Surgical History:  Procedure Laterality Date  . COLONOSCOPY Left 09/24/2015   Procedure: COLONOSCOPY;  Gopal: Juanita Craver, MD;  Location: [facility] ENDOSCOPY;  Service: Endoscopy;  Laterality: Left;  . COLONOSCOPY  09/2015   Dr Collene Mares  . ESOPHAGOGASTRODUODENOSCOPY N/A 09/24/2015   Procedure: ESOPHAGOGASTRODUODENOSCOPY (EGD);  Mcarthy: Juanita Craver, MD;  Location: [facility] ENDOSCOPY;  Service: Endoscopy;  Laterality: N/A;  . EUS N/A 10/05/2015   Procedure:  LOWER ENDOSCOPIC ULTRASOUND (EUS);  Haze: Milus Banister, MD;  Location: [facility] ENDOSCOPY;  Service: Endoscopy;  Laterality: N/A;  . s/p breast biopsy Left 07/2005   benign  . TUBAL LIGATION    . TUBAL LIGATION      reports that she has never smoked. She has never used smokeless tobacco. She reports current alcohol use. She reports that she does not use drugs. family history includes Diabetes in her brother, father, mother, sister, and another family member; Heart disease in her father; Hypertension in her father. No Known Allergies Current Outpatient Medications on File Prior to Visit  Medication Sig Dispense Refill  . atenolol (TENORMIN) 50 MG tablet TAKE 1 TABLET BY MOUTH EVERY DAY 90 tablet 1  . atorvastatin (LIPITOR) 20 MG tablet TAKE 1 TABLET BY MOUTH EVERY DAY 90 tablet 0  . Blood Glucose Monitoring Suppl (ONE TOUCH ULTRA 2) w/Device KIT Use as directed daily 1 each 0  . glipiZIDE (GLUCOTROL XL) 5 MG 24 hr tablet TAKE 2 TABLETS BY MOUTH EVERY DAY 180 tablet 0  . glucose blood (ONE TOUCH ULTRA TEST) test strip USE AS INSTRUCTED ONCE DAILY 100 each 12  . hydrochlorothiazide (HYDRODIURIL) 25 MG tablet TAKE 1 TABLET BY MOUTH EVERY DAY 90 tablet 0  . HYDROcodone-acetaminophen (NORCO) 5-325 MG tablet Take 1 tablet by mouth every 6 (six) hours as needed for moderate pain. To last 30 days 30 tablet 0  . ibuprofen (ADVIL,MOTRIN) 200 MG tablet Take 400 mg by mouth every 6 (  six) hours as needed for mild pain.    Marland Kitchen KLOR-CON M20 20 MEQ tablet TAKE 2 TABLETS BY MOUTH EVERY DAY 180 tablet 0  . Lancets Misc. MISC Use as directed once daily 100 each 11  . pioglitazone (ACTOS) 45 MG tablet TAKE 1 TABLET (45 MG TOTAL) BY MOUTH DAILY. 90 tablet 1  . telmisartan (MICARDIS) 80 MG tablet Take 1 tablet (80 mg total) by mouth daily. 90 tablet 3  . [DISCONTINUED] nebivolol (BYSTOLIC) 10 MG tablet Take 1 tablet (10 mg total) by mouth daily. 90 tablet 3  . [DISCONTINUED] valsartan (DIOVAN) 320 MG tablet Take 1  tablet (320 mg total) by mouth daily. 90 tablet 3   No current facility-administered medications on file prior to visit.    Review of Systems Constitutional: Negative for other unusual diaphoresis, sweats, appetite or weight changes HENT: Negative for other worsening hearing loss, ear pain, facial swelling, mouth sores or neck stiffness.   Eyes: Negative for other worsening pain, redness or other visual disturbance.  Respiratory: Negative for other stridor or swelling Cardiovascular: Negative for other palpitations or other chest pain  Gastrointestinal: Negative for worsening diarrhea or loose stools, blood in stool, distention or other pain Genitourinary: Negative for hematuria, flank pain or other change in urine volume.  Musculoskeletal: Negative for myalgias or other joint swelling.  Skin: Negative for other color change, or other wound or worsening drainage.  Neurological: Negative for other syncope or numbness. Hematological: Negative for other adenopathy or swelling Psychiatric/Behavioral: Negative for hallucinations, other worsening agitation, SI, self-injury, or new decreased concentration All other system neg per pt    Objective:   Physical Exam BP 126/82   Pulse 97   Temp 98.1 F (36.7 C) (Oral)   Ht 5' 5" (1.651 m)   Wt 183 lb (83 kg)   SpO2 94%   BMI 30.45 kg/m  VS noted,  Constitutional: Pt is oriented to person, place, and time. Appears well-developed and well-nourished, in no significant distress and comfortable Head: Normocephalic and atraumatic  Eyes: Conjunctivae and EOM are normal. Pupils are equal, round, and reactive to light Right Ear: External ear normal without discharge Left Ear: External ear normal without discharge Nose: Nose without discharge or deformity Mouth/Throat: Oropharynx is without other ulcerations and moist  Neck: Normal range of motion. Neck supple. No JVD present. No tracheal deviation present or significant neck LA or  mass Cardiovascular: Normal rate, regular rhythm, normal heart sounds and intact distal pulses.   Pulmonary/Chest: WOB normal and breath sounds without rales or wheezing  Abdominal: Soft. Bowel sounds are normal. NT. No HSM  Musculoskeletal: Normal range of motion. Exhibits no edema Lymphadenopathy: Has no other cervical adenopathy.  Neurological: Pt is alert and oriented to person, place, and time. Pt has normal reflexes. No cranial nerve deficit. Motor grossly intact, Gait intact Skin: Skin is warm and dry. No rash noted or new ulcerations Psychiatric:  Has normal mood and affect. Behavior is normal without agitation No other exam findings Lab Results  Component Value Date   WBC 4.6 06/05/2018   HGB 11.9 (L) 06/05/2018   HCT 37.4 06/05/2018   PLT 259 06/05/2018   GLUCOSE 184 (H) 06/05/2018   CHOL 150 06/12/2018   TRIG 186.0 (H) 06/12/2018   HDL 49.00 06/12/2018   LDLCALC 64 06/12/2018   ALT 12 06/05/2018   AST 12 (L) 06/05/2018   NA 142 06/05/2018   K 3.7 06/05/2018   CL 104 06/05/2018   CREATININE 0.77 06/05/2018  BUN 14 06/05/2018   CO2 27 06/05/2018   TSH 1.40 06/12/2018   HGBA1C 8.0 (H) 06/12/2018   MICROALBUR 3.8 (H) 06/12/2018      Assessment & Plan:

## 2018-06-12 NOTE — Patient Instructions (Signed)
You had the Tdap (tetanus) shot today  Please continue all other medications as before, and refills have been done if requested.  Please have the pharmacy call with any other refills you may need.  Please continue your efforts at being more active, low cholesterol diet, and weight control.  You are otherwise up to date with prevention measures today.  Please keep your appointments with your specialists as you may have planned  Please go to the LAB in the Basement (turn left off the elevator) for the tests to be done today  You will be contacted by phone if any changes need to be made immediately.  Otherwise, you will receive a letter about your results with an explanation, but please check with MyChart first.  Please remember to sign up for MyChart if you have not done so, as this will be important to you in the future with finding out test results, communicating by private email, and scheduling acute appointments online when needed.  Please return in 6 months, or sooner if needed, with Lab testing done 3-5 days before  

## 2018-06-12 NOTE — Assessment & Plan Note (Signed)
stable overall by history and exam, recent data reviewed with pt, and pt to continue medical treatment as before,  to f/u any worsening symptoms or concerns  

## 2018-06-12 NOTE — Assessment & Plan Note (Signed)

## 2018-06-15 ENCOUNTER — Telehealth: Payer: Self-pay

## 2018-06-15 ENCOUNTER — Telehealth: Payer: Self-pay | Admitting: Internal Medicine

## 2018-06-15 NOTE — Telephone Encounter (Signed)
-----   Message from Biagio Borg, MD sent at 06/12/2018  6:48 PM EST ----- Left message on MyChart, pt to cont same tx except  The test results show that your current treatment is OK, except the A1c is mildly too high, and there appears to be a small amount of blood in the urine test.  You are currently taking 4 pill medications for diabetes, so please follow a good diet and remember to take all of your medications, as if there is not an improvement, we may need to start insulin.  We also need to refer you to Urology for further evaluation of the blood in the urine.  I will do the referral, and you should hear soon. Redmond Baseman to please inform pt, I will do referral

## 2018-06-15 NOTE — Telephone Encounter (Signed)
Patient calling back for lab results. NT unavailable.    Copied from [place] [id number]. Topic: Quick Communication - Lab Results (Clinic Use ONLY) >> Jun 15, 2018  1:40 PM Juliet Rude, CMA wrote:  Discuss LAB results. Ok for [facility] to inform.

## 2018-06-15 NOTE — Telephone Encounter (Signed)
Charted in result notes. 

## 2018-06-15 NOTE — Telephone Encounter (Signed)
Called pt, LVM.   CRM created.  

## 2018-06-16 ENCOUNTER — Encounter (INDEPENDENT_AMBULATORY_CARE_PROVIDER_SITE_OTHER): Payer: Self-pay | Admitting: Ophthalmology

## 2018-06-30 ENCOUNTER — Other Ambulatory Visit: Payer: Self-pay

## 2018-06-30 ENCOUNTER — Encounter (INDEPENDENT_AMBULATORY_CARE_PROVIDER_SITE_OTHER): Payer: Self-pay | Admitting: Ophthalmology

## 2018-07-14 ENCOUNTER — Other Ambulatory Visit: Payer: Self-pay | Admitting: Internal Medicine

## 2018-08-19 ENCOUNTER — Other Ambulatory Visit: Payer: Self-pay | Admitting: Internal Medicine

## 2018-08-19 DIAGNOSIS — C20 Malignant neoplasm of rectum: Secondary | ICD-10-CM

## 2018-08-19 DIAGNOSIS — E876 Hypokalemia: Secondary | ICD-10-CM

## 2018-09-14 ENCOUNTER — Encounter (INDEPENDENT_AMBULATORY_CARE_PROVIDER_SITE_OTHER): Payer: Self-pay | Admitting: Ophthalmology

## 2018-09-14 ENCOUNTER — Other Ambulatory Visit: Payer: Self-pay

## 2018-09-14 ENCOUNTER — Ambulatory Visit (INDEPENDENT_AMBULATORY_CARE_PROVIDER_SITE_OTHER): Payer: Medicare HMO | Admitting: Ophthalmology

## 2018-09-14 DIAGNOSIS — H3581 Retinal edema: Secondary | ICD-10-CM | POA: Diagnosis not present

## 2018-09-14 DIAGNOSIS — H40053 Ocular hypertension, bilateral: Secondary | ICD-10-CM | POA: Diagnosis not present

## 2018-09-14 DIAGNOSIS — E113393 Type 2 diabetes mellitus with moderate nonproliferative diabetic retinopathy without macular edema, bilateral: Secondary | ICD-10-CM

## 2018-09-14 DIAGNOSIS — I10 Essential (primary) hypertension: Secondary | ICD-10-CM | POA: Diagnosis not present

## 2018-09-14 DIAGNOSIS — H40033 Anatomical narrow angle, bilateral: Secondary | ICD-10-CM

## 2018-09-14 DIAGNOSIS — H25813 Combined forms of age-related cataract, bilateral: Secondary | ICD-10-CM | POA: Diagnosis not present

## 2018-09-14 DIAGNOSIS — H35033 Hypertensive retinopathy, bilateral: Secondary | ICD-10-CM

## 2018-09-14 NOTE — Progress Notes (Addendum)
[facility]  09/14/2018     CHIEF COMPLAINT Patient presents for Diabetic Eye Exam   HISTORY OF PRESENT ILLNESS: Karen Good is a 63 y.o. female who presents to the clinic today for:   HPI    Diabetic Eye Exam    Vision is stable.  Associated Symptoms Negative for Flashes, Blind Spot, Photophobia, Scalp Tenderness, Fever, Floaters, Pain, Glare, Jaw Claudication, Weight Loss, Distortion, Redness, Trauma, Shoulder/Hip pain and Fatigue.  Diabetes characteristics include Type 2 and taking oral medications.  This started 10 years ago.  Blood sugar level fluctuates.  Last Blood Glucose: Hasn't checked.  Last A1C 8 (06/12/2018).  I, the attending physician,  performed the HPI with the patient and updated documentation appropriately.          Comments    Patient states referred by Dr. Kathlen Mody for diabetic retinal eval. May have had laser in the past for both eyes, but not sure if laser was for diabetes. Was done by Dr. Elonda Husky in [place]. Vision seems Ok with glasses.        Last edited by Bernarda Caffey, MD on 09/14/2018  9:44 AM. (History)    pt states she has been diabetic for about 10 years, she states back in February when she saw Dr. Kathlen Mody she was having a hard time with her blood sugar, but she has been able to get in under control since then, she states her vision has also improved since she saw him  Referring physician: Hortencia Pilar, MD [place], [place] [id number]  HISTORICAL INFORMATION:   Selected notes from the [place] Referred by Dr. Quentin Ore for concern of NPDR OU LEE: 02.21.20 Read Drivers) [BCVA: OD: 20/50-- OS: 20/20] Ocular Hx-cataracts OU, glaucoma OU, DES OU, HTN ret OU, shallow AC OU, s/p iridotomy OU (2001) PMH-DM (last A1c: 8.0 [02.28.20] takes Janumet and glipizide), HTN, HLD, colon cancer   CURRENT MEDICATIONS: No current outpatient medications on file. (Ophthalmic Drugs)   No  current facility-administered medications for this visit.  (Ophthalmic Drugs)   Current Outpatient Medications (Other)  Medication Sig  . amLODipine (NORVASC) 5 MG tablet TAKE 1 TABLET BY MOUTH EVERY DAY  . atenolol (TENORMIN) 50 MG tablet TAKE 1 TABLET BY MOUTH EVERY DAY  . atorvastatin (LIPITOR) 20 MG tablet TAKE 1 TABLET BY MOUTH EVERY DAY  . Blood Glucose Monitoring Suppl (ONE TOUCH ULTRA 2) w/Device KIT Use as directed daily  . glipiZIDE (GLUCOTROL XL) 5 MG 24 hr tablet TAKE 2 TABLETS BY MOUTH EVERY DAY  . glucose blood (ONE TOUCH ULTRA TEST) test strip USE AS INSTRUCTED ONCE DAILY  . hydrochlorothiazide (HYDRODIURIL) 25 MG tablet TAKE 1 TABLET BY MOUTH EVERY DAY  . ibuprofen (ADVIL,MOTRIN) 200 MG tablet Take 400 mg by mouth every 6 (six) hours as needed for mild pain.  Marland Kitchen JANUMET 50-1000 MG tablet TAKE 1 TABLET BY MOUTH TWICE A DAY  . KLOR-CON M20 20 MEQ tablet TAKE 2 TABLETS BY MOUTH EVERY DAY  . Lancets Misc. MISC Use as directed once daily  . pioglitazone (ACTOS) 45 MG tablet TAKE 1 TABLET (45 MG TOTAL) BY MOUTH DAILY.  Marland Kitchen telmisartan (MICARDIS) 80 MG tablet Take 1 tablet (80 mg total) by mouth daily.  Marland Kitchen HYDROcodone-acetaminophen (NORCO) 5-325 MG tablet Take 1 tablet by mouth every 6 (six) hours as needed for moderate pain. To last 30 days (Patient not taking: Reported on 09/14/2018)   No  current facility-administered medications for this visit.  (Other)      REVIEW OF SYSTEMS: ROS    Positive for: Endocrine, Eyes   Negative for: Constitutional, Gastrointestinal, Neurological, Skin, Genitourinary, Musculoskeletal, HENT, Cardiovascular, Respiratory, Psychiatric, Allergic/Imm, Heme/Lymph   Last edited by Roselee Nova D on 09/14/2018  9:04 AM. (History)       ALLERGIES No Known Allergies  PAST MEDICAL HISTORY Past Medical History:  Diagnosis Date  . Anemia   . BACK PAIN 04/13/2010  . BACK STRAIN, LUMBAR 04/13/2010  . Cancer (HCC)    rectal  . DIABETES MELLITUS, TYPE II  06/02/2008  . History of blood transfusion   . History of chemotherapy   . History of radiation therapy   . Hx of transfusion of whole blood 09/2015   prior to colon surgery - [facility]  . HYPERLIPIDEMIA 06/02/2008  . HYPERTENSION 06/02/2008   Past Surgical History:  Procedure Laterality Date  . COLONOSCOPY Left 09/24/2015   Procedure: COLONOSCOPY;  Dingee: Juanita Craver, MD;  Location: [facility] ENDOSCOPY;  Service: Endoscopy;  Laterality: Left;  . COLONOSCOPY  09/2015   Dr Collene Mares  . ESOPHAGOGASTRODUODENOSCOPY N/A 09/24/2015   Procedure: ESOPHAGOGASTRODUODENOSCOPY (EGD);  Tri: Juanita Craver, MD;  Location: [facility] ENDOSCOPY;  Service: Endoscopy;  Laterality: N/A;  . EUS N/A 10/05/2015   Procedure: LOWER ENDOSCOPIC ULTRASOUND (EUS);  Carpenter: Milus Banister, MD;  Location: [facility] ENDOSCOPY;  Service: Endoscopy;  Laterality: N/A;  . s/p breast biopsy Left 07/2005   benign  . TUBAL LIGATION    . TUBAL LIGATION      FAMILY HISTORY Family History  Problem Relation Age of Onset  . Diabetes Mother   . Heart disease Father   . Diabetes Father   . Hypertension Father   . Diabetes Sister   . Diabetes Other   . Diabetes Brother   . Colon cancer Neg Hx   . Rectal cancer Neg Hx   . Stomach cancer Neg Hx     SOCIAL HISTORY Social History   Tobacco Use  . Smoking status: Never Smoker  . Smokeless tobacco: Never Used  Substance Use Topics  . Alcohol use: Yes    Comment: socially  . Drug use: No         OPHTHALMIC EXAM:  Base Eye Exam    Visual Acuity (Snellen - Linear)      Right Left   Dist cc 20/40 20/25   Dist ph cc 20/25 -2 20/20 -2   Correction:  Glasses       Tonometry (Tonopen, 9:19 AM)      Right Left   Pressure 23 22       Pupils      Dark Light Shape React APD   Right 3 2 Round Brisk None   Left 3 2 Round Brisk None       Visual Fields (Counting fingers)      Left Right    Full Full       Extraocular Movement      Right Left    Full, Ortho Full, Ortho        Neuro/Psych    Oriented x3:  Yes   Mood/Affect:  Normal       Dilation    Both eyes:  1.0% Mydriacyl, 2.5% Phenylephrine @ 9:19 AM        Slit Lamp and Fundus Exam    Slit Lamp Exam      Right Left   Lids/Lashes Dermatochalasis - upper lid, Meibomian  gland dysfunction Dermatochalasis - upper lid, Meibomian gland dysfunction   Conjunctiva/Sclera nasal Pinguecula, mild Melanosis mild Melanosis   Cornea Debris in tear film, mild Arcus Debris in tear film, mild Arcus, 1+ Punctate epithelial erosions   Anterior Chamber moderate depth, narrow angles moderate depth, narrow angles   Iris Round and moderately dilated, no PI / TID visible, No NVI Round and moderately dilated, TID / LPI at 1100, No NVI   Lens 2+ Nuclear sclerosis, 2-3+ Cortical cataract 2+ Nuclear sclerosis, 2-3+ Cortical cataract   Vitreous Vitreous syneresis Vitreous syneresis       Fundus Exam      Right Left   Disc Pink and Sharp, small flame heme at 0600 just off disc Pink and Sharp, PPP/atrophy extending inferiorly along disc   C/D Ratio 0.3 0.4   Macula Flat, Blunted foveal reflex, few central MA/exudates Flat, Blunted foveal reflex, Microaneurysms, no edema   Vessels Vascular attenuation, Copper wiring, mild Tortuousity, AV crossing changes Vascular attenuation, Copper wiring, mild Tortuousity, AV crossing changes   Periphery Attached, rare MA    Attached           Refraction    Wearing Rx      Sphere Cylinder Axis Add   Right +1.00 +0.50 005 +2.25   Left +1.25 +0.75 135 +2.25       Manifest Refraction      Sphere Cylinder Axis Dist VA   Right +1.00 +0.25 005 20/25-2   Left +1.25 +0.75 135 20/20          IMAGING AND PROCEDURES  Imaging and Procedures for '@TODAY'$ @  OCT, Retina - OU - Both Eyes       Right Eye Quality was good. Central Foveal Thickness: 276. Progression has no prior data. Findings include normal foveal contour, no SRF, intraretinal fluid.   Left Eye Quality was good. Central  Foveal Thickness: 252. Progression has no prior data. Findings include normal foveal contour, no IRF, no SRF.   Notes *Images captured and stored on drive  Diagnosis / Impression:  OD: NFP; mild cystic changes; no SRF OS: NFP; no IRF/SRF  Clinical management:  See below  Abbreviations: NFP - Normal foveal profile. CME - cystoid macular edema. PED - pigment epithelial detachment. IRF - intraretinal fluid. SRF - subretinal fluid. EZ - ellipsoid zone. ERM - epiretinal membrane. ORA - outer retinal atrophy. ORT - outer retinal tubulation. SRHM - subretinal hyper-reflective material        Fluorescein Angiography Optos (Transit OD)       Right Eye   Progression has no prior data. Early phase findings include microaneurysm. Mid/Late phase findings include microaneurysm, leakage.   Left Eye   Progression has no prior data. Early phase findings include microaneurysm, staining, window defect. Mid/Late phase findings include microaneurysm, leakage, window defect, staining.   Notes **Images stored on drive**  Impression:  Moderate NPDR OU OD: scattered late leaking MA; no NV OS: PPA with staining/window defect; scattered late leaking MA; no NV                 ASSESSMENT/PLAN:    ICD-10-CM   1. Moderate nonproliferative diabetic retinopathy of both eyes without macular edema associated with type 2 diabetes mellitus (HCC) V76.1607 Fluorescein Angiography Optos (Transit OD)  2. Retinal edema H35.81 OCT, Retina - OU - Both Eyes  3. Essential hypertension I10   4. Hypertensive retinopathy of both eyes H35.033 Fluorescein Angiography Optos (Transit OD)  5. Combined forms of age-related cataract  of both eyes H25.813   6. Ocular hypertension, bilateral H40.053   7. Borderline glaucoma with anatomical narrow angle of both eyes H40.033     1,2. Moderate nonproliferative diabetic retinopathy w/o DME, OU  - The incidence, risk factors for progression, natural history and  treatment options for diabetic retinopathy were discussed with patient.    - The need for close monitoring of blood glucose, blood pressure, and serum lipids, avoiding cigarette or any type of tobacco, and the need for long term follow up was also discussed with patient.  - was initially referred in Feb 2020 by Dr. Kathlen Mody -- at which time BCVA OD was 20/50   - today BCVA improved to 20/25 -- pt reports improvement in blood glucose control since Feb  - exam today shows scattered IRH/MA OU (OD > OS)  - OCT without diabetic macular edema, OU, but OD with tr cystic changes  - FA shows scattered late leaking MA OU; no NV  - discussed findings and treatment options  - f/u in 3-4 mos  3,4. Hypertensive retinopathy OU  - discussed importance of tight BP control  - monitor  5. Mixed form age related cataract OU  - The symptoms of cataract, surgical options, and treatments and risks were discussed with patient.  - discussed diagnosis and progression  - not yet visually significant  - monitor for now  6,7. Ocular Hypertension/Narrow-angle glaucoma suspect  - IOP today: OD: 23, OS: 22  - s/p LPI OU -- only OS appears patent  - under the expert management of Dr. Kathlen Mody   Ophthalmic Meds Ordered this visit:  No orders of the defined types were placed in this encounter.      Return for f/u 3-4 months, NPDR OU, DFE, OCT.  There are no Patient Instructions on file for this visit.   Explained the diagnoses, plan, and follow up with the patient and they expressed understanding.  Patient expressed understanding of the importance of proper follow up care.   This document serves as a record of services personally performed by Gardiner Sleeper, MD, PhD. It was created on their behalf by Ernest Mallick, OA, an ophthalmic assistant. The creation of this record is the provider's dictation and/or activities during the visit.    Electronically signed by: Ernest Mallick, OA  06.01.2020 12:48 PM    Gardiner Sleeper, M.D., Ph.D. Diseases & Surgery of the Retina and Vitreous [facility]  I have reviewed the above documentation for accuracy and completeness, and I agree with the above. Gardiner Sleeper, M.D., Ph.D. 09/14/18 12:48 PM   Abbreviations: M myopia (nearsighted); A astigmatism; H hyperopia (farsighted); P presbyopia; Mrx spectacle prescription;  CTL contact lenses; OD right eye; OS left eye; OU both eyes  XT exotropia; ET esotropia; PEK punctate epithelial keratitis; PEE punctate epithelial erosions; DES dry eye syndrome; MGD meibomian gland dysfunction; ATs artificial tears; PFAT's preservative free artificial tears; [place] nuclear sclerotic cataract; PSC posterior subcapsular cataract; ERM epi-retinal membrane; PVD posterior vitreous detachment; RD retinal detachment; DM diabetes mellitus; DR diabetic retinopathy; NPDR non-proliferative diabetic retinopathy; PDR proliferative diabetic retinopathy; CSME clinically significant macular edema; DME diabetic macular edema; dbh dot blot hemorrhages; CWS cotton wool spot; POAG primary open angle glaucoma; C/D cup-to-disc ratio; HVF humphrey visual field; GVF goldmann visual field; OCT optical coherence tomography; IOP intraocular pressure; BRVO Branch retinal vein occlusion; CRVO central retinal vein occlusion; CRAO central retinal artery occlusion; BRAO branch retinal artery occlusion; RT retinal  tear; SB scleral buckle; PPV pars plana vitrectomy; VH Vitreous hemorrhage; PRP panretinal laser photocoagulation; IVK intravitreal kenalog; VMT vitreomacular traction; MH Macular hole;  NVD neovascularization of the disc; NVE neovascularization elsewhere; AREDS age related eye disease study; ARMD age related macular degeneration; POAG primary open angle glaucoma; EBMD epithelial/anterior basement membrane dystrophy; ACIOL anterior chamber intraocular lens; IOL intraocular lens; PCIOL posterior chamber intraocular lens; Phaco/IOL phacoemulsification  with intraocular lens placement; [place] photorefractive keratectomy; LASIK laser assisted in situ keratomileusis; HTN hypertension; DM diabetes mellitus; COPD chronic obstructive pulmonary disease

## 2018-09-18 DIAGNOSIS — H524 Presbyopia: Secondary | ICD-10-CM | POA: Diagnosis not present

## 2018-09-18 DIAGNOSIS — Z9889 Other specified postprocedural states: Secondary | ICD-10-CM | POA: Diagnosis not present

## 2018-09-18 DIAGNOSIS — H40023 Open angle with borderline findings, high risk, bilateral: Secondary | ICD-10-CM | POA: Diagnosis not present

## 2018-09-23 ENCOUNTER — Telehealth: Payer: Self-pay | Admitting: Internal Medicine

## 2018-09-23 MED ORDER — METFORMIN HCL 1000 MG PO TABS: 1000.0000 mg | ORAL_TABLET | Freq: Two times a day (BID) | ORAL | 3 refills | Status: DC

## 2018-09-23 MED ORDER — SITAGLIPTIN PHOSPHATE 100 MG PO TABS: 100.0000 mg | ORAL_TABLET | Freq: Every day | ORAL | 3 refills | Status: DC

## 2018-09-23 NOTE — Addendum Note (Signed)
Addended by: Biagio Borg on: 09/23/2018 06:33 PM   Modules accepted: Orders

## 2018-09-23 NOTE — Telephone Encounter (Signed)
Copied from [place] [id number]. Topic: Quick Communication - Rx Refill/Question >> Sep 23, 2018  2:20 PM Nils Flack wrote: Medication: replacement for janumet   Has the patient contacted their pharmacy? No. (Agent: If no, request that the patient contact the pharmacy for the refill.) (Agent: If yes, when and what did the pharmacy advise?)  Preferred Pharmacy (with phone number or street name): [facility] [address]  Med is too expensive, pt asking for generic or substitute Cb is [phone]   Agent: Please be advised that RX refills may take up to 3 business days. We ask that you follow-up with your pharmacy.

## 2018-09-23 NOTE — Telephone Encounter (Signed)
Ok to change to [place] 100 and metformin 1000 bid, but have the pharmacy let us know of a covered alternative if the [place] is not covered with her insurance

## 2018-09-24 NOTE — Telephone Encounter (Signed)
Pt has been informed and expressed understanding.   She will check with insurance company to see if these medications are in her price range if not she will check to see what type of similar medications they have on the formulary that is in her price range.

## 2018-10-11 ENCOUNTER — Other Ambulatory Visit: Payer: Self-pay | Admitting: Internal Medicine

## 2018-10-19 ENCOUNTER — Other Ambulatory Visit: Payer: Self-pay | Admitting: Internal Medicine

## 2018-10-20 DIAGNOSIS — R3121 Asymptomatic microscopic hematuria: Secondary | ICD-10-CM | POA: Diagnosis not present

## 2018-11-08 ENCOUNTER — Other Ambulatory Visit: Payer: Self-pay | Admitting: Internal Medicine

## 2018-11-19 ENCOUNTER — Other Ambulatory Visit: Payer: Self-pay | Admitting: Internal Medicine

## 2018-12-02 ENCOUNTER — Other Ambulatory Visit: Payer: Self-pay | Admitting: Internal Medicine

## 2018-12-02 DIAGNOSIS — E876 Hypokalemia: Secondary | ICD-10-CM

## 2018-12-02 DIAGNOSIS — C20 Malignant neoplasm of rectum: Secondary | ICD-10-CM

## 2018-12-03 NOTE — Progress Notes (Signed)
[facility]   Telephone:(336) [phone] [phone]) [phone]   Clinic Follow up Note   Patient Care Team: Biagio Borg, MD as PCP - Eber Jones, MD as Consulting Physician (Radiation Oncology) Leighton Ruff, MD as Consulting Physician (General Surgery) Milus Banister, MD as Attending Physician (Gastroenterology) Tania Ade, RN as Registered Nurse  Date of Service:  12/04/2018  CHIEF COMPLAINT: Follow up rectal cancer  SUMMARY OF ONCOLOGIC HISTORY: Oncology History Overview Note  Rectal cancer [facility])   Staging form: Colon and Rectum, AJCC 7th Edition   - Clinical: Stage IIA (T3, N0, M0) - Signed by [name] Merle, MD on 10/10/2015   - Pathologic stage from 01/31/2016: Stage IIA (T3, N0, cM0) - Signed by [name] Merle, MD on 02/22/2016     Rectal cancer ([place])  09/22/2015 Imaging   CT ABD/PELVIS: Soft tissue fullness at the rectosigmoid junction. Otherwise, no acute process in the abdomen or pelvis   09/24/2015 Pathology Results   Adenocarcinoma   09/24/2015 Initial Diagnosis   Rectal cancer ([place])   09/24/2015 Procedure   COLONOSCOPY: Nonobsructive mass in rectosigmoid colon from 10-20 cum, circumferential (Dr. Collene Mares)   09/24/2015 Tumor Marker   CEA=4.1   09/25/2015 Imaging   CT CHEST: Mass within the upper-outer quadrant of the left breast warranting further evaluation. No suspicious pulmonary abnormality   10/25/2015 - 12/01/2015 Radiation Therapy   adjuvant irradiation to rectal cancer   10/25/2015 - 12/01/2015 Chemotherapy   Xeloda 1500 mg twice daily, with concurrent irradiation   01/31/2016 Surgery   Robot assisted low anterior resection of rectal cancer    01/31/2016 Pathology Results   Invasive colorectal adenocarcinoma, 3.5cm, G2, LVI(-), peri-neural invasion (-), ypT3, margins negative, 17 nodes all negative    03/11/2016 - 06/14/2016 Chemotherapy   Adjuvant Xeloda 2000 mg in a.m., 1500 mg a evening, 2 weeks on and one-week off, a total 4-5 cycles.   10/18/2016 Imaging   CT CAP 10/18/16 IMPRESSION: Status post low anterior resection with radiation changes in the presacral region.  No findings suspicious for recurrent or metastatic disease.   01/31/2017 Mammogram   IMPRESSION: No mammographic evidence of malignancy. A result letter of this screening mammogram will be mailed directly to the patient.   05/23/2017 Procedure   05/23/2017 Colonoscopy - One 3 mm polyp in the transverse colon, removed with a cold snare. Resected and retrieved. - Normal colo-colonic anastomosis about 4cm from the anal verge. - The examination was otherwise normal on direct and retroflexion views.   10/31/2017 Imaging   10/31/2017 CT CAP IMPRESSION: Stable post treatment changes in perirectal and presacral region. No evidence recurrent or metastatic carcinoma.  Stable small uterine fibroids      CURRENT THERAPY:  Surveillance  INTERVAL HISTORY: Karen Good is here for a follow up and rectal cancer. She was last seen by me 6 months ago. She presents to the clinic alone. She notes her mother passed at [place] from [place] in 09/07/18 and her brohter passed away in 10/08/2018 from past Stroke complications. She notes she is coping. Her BP has been elevated. She denies HA or chest discomfort. She notes she is eating well and has normal BMs.  She notes she recently saw her PCP and will see Dr. Marcello Moores in 2 months    REVIEW OF SYSTEMS:   Constitutional: Denies fevers, chills or abnormal weight loss Eyes: Denies blurriness of vision Ears, nose, mouth, throat, and face: Denies mucositis or sore throat Respiratory: Denies  cough, dyspnea or wheezes Cardiovascular: Denies palpitation, chest discomfort or lower extremity swelling Gastrointestinal:  Denies nausea, heartburn or change in bowel habits Skin: Denies abnormal skin rashes Lymphatics: Denies new lymphadenopathy or easy bruising Neurological:Denies numbness, tingling or new weaknesses  Behavioral/Psych: Mood is stable, no new changes  All other systems were reviewed with the patient and are negative.  MEDICAL HISTORY:  Past Medical History:  Diagnosis Date  . Anemia   . BACK PAIN 04/13/2010  . BACK STRAIN, LUMBAR 04/13/2010  . Cancer ([facility])    rectal  . DIABETES MELLITUS, TYPE II 06/02/2008  . History of blood transfusion   . History of chemotherapy   . History of radiation therapy   . Hx of transfusion of whole blood 09/2015   prior to colon surgery - [facility]  . HYPERLIPIDEMIA 06/02/2008  . HYPERTENSION 06/02/2008    SURGICAL HISTORY: Past Surgical History:  Procedure Laterality Date  . COLONOSCOPY Left 09/24/2015   Procedure: COLONOSCOPY;  [place]: Juanita Craver, MD;  Location: [facility] ENDOSCOPY;  Service: Endoscopy;  Laterality: Left;  . COLONOSCOPY  09/2015   Dr Collene Mares  . ESOPHAGOGASTRODUODENOSCOPY N/A 09/24/2015   Procedure: ESOPHAGOGASTRODUODENOSCOPY (EGD);  Wisenbaker: Juanita Craver, MD;  Location: [facility] ENDOSCOPY;  Service: Endoscopy;  Laterality: N/A;  . EUS N/A 10/05/2015   Procedure: LOWER ENDOSCOPIC ULTRASOUND (EUS);  Bonds: Milus Banister, MD;  Location: [facility] ENDOSCOPY;  Service: Endoscopy;  Laterality: N/A;  . s/p breast biopsy Left 07/2005   benign  . TUBAL LIGATION    . TUBAL LIGATION      I have reviewed the social history and family history with the patient and they are unchanged from previous note.  ALLERGIES:  has No Known Allergies.  MEDICATIONS:  Current Outpatient Medications  Medication Sig Dispense Refill  . amLODipine (NORVASC) 5 MG tablet TAKE 1 TABLET BY MOUTH EVERY DAY 90 tablet 1  . atenolol (TENORMIN) 50 MG tablet TAKE 1 TABLET BY MOUTH EVERY DAY 90 tablet 1  . atorvastatin (LIPITOR) 20 MG tablet TAKE 1 TABLET BY MOUTH EVERY DAY 90 tablet 0  . Blood Glucose Monitoring Suppl (ONE TOUCH ULTRA 2) w/Device KIT Use as directed daily 1 each 0  . glipiZIDE (GLUCOTROL XL) 5 MG 24 hr tablet TAKE 2 TABLETS BY MOUTH EVERY DAY 60 tablet 2  . glucose blood  (ONE TOUCH ULTRA TEST) test strip USE AS INSTRUCTED ONCE DAILY 100 each 12  . hydrochlorothiazide (HYDRODIURIL) 25 MG tablet TAKE 1 TABLET BY MOUTH EVERY DAY 90 tablet 0  . HYDROcodone-acetaminophen (NORCO) 5-325 MG tablet Take 1 tablet by mouth every 6 (six) hours as needed for moderate pain. To last 30 days (Patient not taking: Reported on 09/14/2018) 30 tablet 0  . ibuprofen (ADVIL,MOTRIN) 200 MG tablet Take 400 mg by mouth every 6 (six) hours as needed for mild pain.    Marland Kitchen KLOR-CON M20 20 MEQ tablet TAKE 2 TABLETS BY MOUTH EVERY DAY 60 tablet 2  . Lancets Misc. MISC Use as directed once daily 100 each 11  . metFORMIN (GLUCOPHAGE) 1000 MG tablet Take 1 tablet (1,000 mg total) by mouth 2 (two) times daily with a meal. 180 tablet 3  . pioglitazone (ACTOS) 45 MG tablet TAKE 1 TABLET BY MOUTH EVERY DAY 90 tablet 1  . sitaGLIPtin (JANUVIA) 100 MG tablet Take 1 tablet (100 mg total) by mouth daily. 90 tablet 3  . telmisartan (MICARDIS) 80 MG tablet TAKE 1 TABLET BY MOUTH EVERY DAY 90 tablet 0   No  current facility-administered medications for this visit.     PHYSICAL EXAMINATION: ECOG PERFORMANCE STATUS: 0 - Asymptomatic  Vitals:   12/04/18 1316 12/04/18 1317  BP: (!) 207/78 (!) 210/86  Pulse: 95   Resp: 16   Temp: 98.9 F (37.2 C)   SpO2: 100%    Filed Weights   12/04/18 1316  Weight: 180 lb 3.2 oz (81.7 kg)    GENERAL:alert, no distress and comfortable SKIN: skin color, texture, turgor are normal, no rashes or significant lesions EYES: normal, Conjunctiva are pink and non-injected, sclera clear  NECK: supple, thyroid normal size, non-tender, without nodularity LYMPH:  no palpable lymphadenopathy in the cervical, axillary or inguinal LUNGS: clear to auscultation and percussion with normal breathing effort HEART: regular rate & rhythm and no murmurs and no lower extremity edema ABDOMEN:abdomen soft, non-tender and normal bowel sounds Musculoskeletal:no cyanosis of digits and no  clubbing  NEURO: alert & oriented x 3 with fluent speech, no focal motor/sensory deficits  LABORATORY DATA:  I have reviewed the data as listed CBC Latest Ref Rng & Units 12/04/2018 06/05/2018 10/31/2017  WBC 4.0 - 10.5 K/uL 5.1 4.6 5.0  Hemoglobin 12.0 - 15.0 g/dL 11.7(L) 11.9(L) 12.1  Hematocrit 36.0 - 46.0 % 36.6 37.4 37.3  Platelets 150 - 400 K/uL 264 259 280     CMP Latest Ref Rng & Units 12/04/2018 06/05/2018 10/31/2017  Glucose 70 - 99 mg/dL 144(H) 184(H) 138(H)  BUN 8 - 23 mg/dL '13 14 11  '$ Creatinine 0.44 - 1.00 mg/dL 0.74 0.77 0.71  Sodium 135 - 145 mmol/L 141 142 141  Potassium 3.5 - 5.1 mmol/L 4.2 3.7 3.6  Chloride 98 - 111 mmol/L 104 104 102  CO2 22 - 32 mmol/L '25 27 28  '$ Calcium 8.9 - 10.3 mg/dL 9.6 9.6 10.2  Total Protein 6.5 - 8.1 g/dL 7.0 7.3 7.8  Total Bilirubin 0.3 - 1.2 mg/dL 1.0 1.0 1.0  Alkaline Phos 38 - 126 U/L 69 77 88  AST 15 - 41 U/L 14(L) 12(L) 13(L)  ALT 0 - 44 U/L '14 12 15      '$ RADIOGRAPHIC STUDIES: I have personally reviewed the radiological images as listed and agreed with the findings in the report. No results found.   ASSESSMENT & PLAN:  Karen Good is a 63 y.o. female with   1. Rectal adenocarcinoma, proximal rectum, uT3N0M0, stage IIA, ypT3N0M0  -She was diagnosed in 09/2015. She is s/p chemoRT, surgical resection and adjuvant Xeloda.  -Her surveillance Colonoscopy was in 05/2017 and was normal except 1 benign polyp. Will repeat in 3 years.  -She is clinically doing well. Labs reviewed, CBC and CMP WNL except HG 11.7, BG 144. Iron panel still pending. Physical exam was unremarkable, she declined rectal exam today. There is no clinical concern for recurrence.  -Continue surveillance. She is 3 years since diagnoses.  Given her stage II disease, I do not plan to rescan her unless there is clinical concerns of recurrence.  -I encouraged her to reduce her carbohydrates and exercise more in order to work on losing weight.  -F/u 8 months, she will  see Dr. Marcello Moores in the interim    2. HTN and DM -Continue medication. She'll follow-up with her primary care physician -I recommend low salt diet and exercise not just walking but being more active on the weekends.  -I encouraged her to monitor blood pressure and blood glucose at home, and follow-up with PCP. -BP at 210/86 today (12/04/18), not well controlled. She  has been more stressed lately with the recent loss of her mother and brorher.    3. Cancer screening  -Previous CT chest revealed a left breast mass, however her screening mammogram was negative in 09/2015 -Her diagnostic mammogram showed the left breast lesion has been stable for a few years, likely benign, no biopsy was recommended. -Mammogram on 02/2018 revealed no mammographic evidence of malignancy. Continue annual screening mammogram.    4. Hypokalemia  -likely related to her HTN meds HCTZ  -She will continue oral potassium supplement -Resolved.     PLAN -She is clinically doing well, no concern for recurrence. -Lab and f/u in 8 months    No problem-specific Assessment & Plan notes found for this encounter.   No orders of the defined types were placed in this encounter.  All questions were answered. The patient knows to call the clinic with any problems, questions or concerns. No barriers to learning was detected. I spent 15 minutes counseling the patient face to face. The total time spent in the appointment was 20 minutes and more than 50% was on counseling and review of test results     [name] Merle, MD 12/04/2018   I, Joslyn Devon, am acting as scribe for [name] Merle, MD.   I have reviewed the above documentation for accuracy and completeness, and I agree with the above.

## 2018-12-04 ENCOUNTER — Inpatient Hospital Stay: Payer: Medicare HMO

## 2018-12-04 ENCOUNTER — Inpatient Hospital Stay: Payer: Medicare HMO | Attending: Hematology | Admitting: Hematology

## 2018-12-04 ENCOUNTER — Telehealth: Payer: Self-pay | Admitting: Hematology

## 2018-12-04 ENCOUNTER — Other Ambulatory Visit: Payer: Self-pay

## 2018-12-04 VITALS — BP 210/86 | HR 95 | Temp 98.9°F | Resp 16 | Ht 65.0 in | Wt 180.2 lb

## 2018-12-04 DIAGNOSIS — E119 Type 2 diabetes mellitus without complications: Secondary | ICD-10-CM | POA: Insufficient documentation

## 2018-12-04 DIAGNOSIS — D500 Iron deficiency anemia secondary to blood loss (chronic): Secondary | ICD-10-CM | POA: Diagnosis not present

## 2018-12-04 DIAGNOSIS — C20 Malignant neoplasm of rectum: Secondary | ICD-10-CM | POA: Diagnosis not present

## 2018-12-04 DIAGNOSIS — I10 Essential (primary) hypertension: Secondary | ICD-10-CM | POA: Insufficient documentation

## 2018-12-04 DIAGNOSIS — D509 Iron deficiency anemia, unspecified: Secondary | ICD-10-CM

## 2018-12-04 DIAGNOSIS — E876 Hypokalemia: Secondary | ICD-10-CM | POA: Diagnosis not present

## 2018-12-04 LAB — CBC WITH DIFFERENTIAL/PLATELET
Abs Immature Granulocytes: 0.03 10*3/uL (ref 0.00–0.07)
Basophils Absolute: 0 10*3/uL (ref 0.0–0.1)
Basophils Relative: 0 %
Eosinophils Absolute: 0 10*3/uL (ref 0.0–0.5)
Eosinophils Relative: 1 %
HCT: 36.6 % (ref 36.0–46.0)
Hemoglobin: 11.7 g/dL — ABNORMAL LOW (ref 12.0–15.0)
Immature Granulocytes: 1 %
Lymphocytes Relative: 17 %
Lymphs Abs: 0.8 10*3/uL (ref 0.7–4.0)
MCH: 27.2 pg (ref 26.0–34.0)
MCHC: 32 g/dL (ref 30.0–36.0)
MCV: 85.1 fL (ref 80.0–100.0)
Monocytes Absolute: 0.4 10*3/uL (ref 0.1–1.0)
Monocytes Relative: 8 %
Neutro Abs: 3.8 10*3/uL (ref 1.7–7.7)
Neutrophils Relative %: 73 %
Platelets: 264 10*3/uL (ref 150–400)
RBC: 4.3 MIL/uL (ref 3.87–5.11)
RDW: 15.4 % (ref 11.5–15.5)
WBC: 5.1 10*3/uL (ref 4.0–10.5)
nRBC: 0 % (ref 0.0–0.2)

## 2018-12-04 LAB — COMPREHENSIVE METABOLIC PANEL
ALT: 14 U/L (ref 0–44)
AST: 14 U/L — ABNORMAL LOW (ref 15–41)
Albumin: 4.2 g/dL (ref 3.5–5.0)
Alkaline Phosphatase: 69 U/L (ref 38–126)
Anion gap: 12 (ref 5–15)
BUN: 13 mg/dL (ref 8–23)
CO2: 25 mmol/L (ref 22–32)
Calcium: 9.6 mg/dL (ref 8.9–10.3)
Chloride: 104 mmol/L (ref 98–111)
Creatinine, Ser: 0.74 mg/dL (ref 0.44–1.00)
GFR calc Af Amer: >60 mL/min (ref >60)
GFR calc non Af Amer: >60 mL/min (ref >60)
Glucose, Bld: 144 mg/dL — ABNORMAL HIGH (ref 70–99)
Potassium: 4.2 mmol/L (ref 3.5–5.1)
Sodium: 141 mmol/L (ref 135–145)
Total Bilirubin: 1 mg/dL (ref 0.3–1.2)
Total Protein: 7 g/dL (ref 6.5–8.1)

## 2018-12-04 LAB — IRON AND TIBC
Iron: 72 ug/dL (ref 41–142)
Saturation Ratios: 18 % — ABNORMAL LOW (ref 21–57)
TIBC: 409 ug/dL (ref 236–444)
UIBC: 337 ug/dL (ref 120–384)

## 2018-12-04 LAB — FERRITIN: Ferritin: 44 ng/mL (ref 11–307)

## 2018-12-04 NOTE — Telephone Encounter (Signed)
Scheduled appointments per 08/21 los, patient received calender and avs.

## 2018-12-05 ENCOUNTER — Encounter: Payer: Self-pay | Admitting: Hematology

## 2018-12-07 ENCOUNTER — Other Ambulatory Visit: Payer: Self-pay | Admitting: Internal Medicine

## 2018-12-11 ENCOUNTER — Ambulatory Visit (INDEPENDENT_AMBULATORY_CARE_PROVIDER_SITE_OTHER): Payer: Medicare HMO | Admitting: Internal Medicine

## 2018-12-11 DIAGNOSIS — E785 Hyperlipidemia, unspecified: Secondary | ICD-10-CM | POA: Diagnosis not present

## 2018-12-11 DIAGNOSIS — I10 Essential (primary) hypertension: Secondary | ICD-10-CM | POA: Diagnosis not present

## 2018-12-11 DIAGNOSIS — E119 Type 2 diabetes mellitus without complications: Secondary | ICD-10-CM

## 2018-12-11 DIAGNOSIS — E538 Deficiency of other specified B group vitamins: Secondary | ICD-10-CM | POA: Diagnosis not present

## 2018-12-11 DIAGNOSIS — E559 Vitamin D deficiency, unspecified: Secondary | ICD-10-CM | POA: Diagnosis not present

## 2018-12-11 DIAGNOSIS — Z0000 Encounter for general adult medical examination without abnormal findings: Secondary | ICD-10-CM

## 2018-12-11 NOTE — Progress Notes (Signed)
Patient ID: Karen Good, female   DOB: 06-20-1955, 63 y.o.   MRN: [id number]  Virtual Visit via Video Note  I connected with Karen Saxon Devenport on 12/11/18 at  2:20 PM EDT by a video enabled telemedicine application and verified that I am speaking with the correct person using two identifiers.  Location: Patient: at home Provider: at office   I discussed the limitations of evaluation and management by telemedicine and the availability of in person appointments. The patient expressed understanding and agreed to proceed.  History of Present Illness: Here to f/u; overall doing ok,  Pt denies chest pain, increasing sob or doe, wheezing, orthopnea, PND, [name], palpitations, dizziness or syncope.  Pt denies new neurological symptoms such as new headache, or facial or extremity weakness or numbness.  Pt denies polydipsia, polyuria, or low sugar episode.  Pt states overall good compliance with meds, mostly trying to follow appropriate diet, with wt overall stable,  but little exercise however.  No new complaints  BP at home < 140/90 per pt Past Medical History:  Diagnosis Date  . Anemia   . BACK PAIN 04/13/2010  . BACK STRAIN, LUMBAR 04/13/2010  . Cancer ([facility])    rectal  . DIABETES MELLITUS, TYPE II 06/02/2008  . History of blood transfusion   . History of chemotherapy   . History of radiation therapy   . Hx of transfusion of whole blood 09/2015   prior to colon surgery - [facility]  . HYPERLIPIDEMIA 06/02/2008  . HYPERTENSION 06/02/2008   Past Surgical History:  Procedure Laterality Date  . COLONOSCOPY Left 09/24/2015   Procedure: COLONOSCOPY;  [place]: Juanita Craver, MD;  Location: [facility] ENDOSCOPY;  Service: Endoscopy;  Laterality: Left;  . COLONOSCOPY  09/2015   Dr Collene Mares  . ESOPHAGOGASTRODUODENOSCOPY N/A 09/24/2015   Procedure: ESOPHAGOGASTRODUODENOSCOPY (EGD);  Shands: Juanita Craver, MD;  Location[facility] ENDOSCOPY;  Service: Endoscopy;  Laterality: N/A;  . EUS N/A 10/05/2015   Procedure:  LOWER ENDOSCOPIC ULTRASOUND (EUS);  Cartaya: Milus Banister, MD;  Location: [facility] ENDOSCOPY;  Service: Endoscopy;  Laterality: N/A;  . s/p breast biopsy Left 07/2005   benign  . TUBAL LIGATION    . TUBAL LIGATION      reports that she has never smoked. She has never used smokeless tobacco. She reports current alcohol use. She reports that she does not use drugs. family history includes Diabetes in her brother, father, mother, sister, and another family member; Heart disease in her father; Hypertension in her father. No Known Allergies Current Outpatient Medications on File Prior to Visit  Medication Sig Dispense Refill  . amLODipine (NORVASC) 5 MG tablet TAKE 1 TABLET BY MOUTH EVERY DAY 90 tablet 1  . atenolol (TENORMIN) 50 MG tablet TAKE 1 TABLET BY MOUTH EVERY DAY 90 tablet 1  . atorvastatin (LIPITOR) 20 MG tablet TAKE 1 TABLET BY MOUTH EVERY DAY 90 tablet 0  . Blood Glucose Monitoring Suppl (ONE TOUCH ULTRA 2) w/Device KIT Use as directed daily 1 each 0  . glipiZIDE (GLUCOTROL XL) 5 MG 24 hr tablet TAKE 2 TABLETS BY MOUTH EVERY DAY 60 tablet 2  . glucose blood (ONE TOUCH ULTRA TEST) test strip USE AS INSTRUCTED ONCE DAILY 100 each 12  . hydrochlorothiazide (HYDRODIURIL) 25 MG tablet TAKE 1 TABLET BY MOUTH EVERY DAY 90 tablet 0  . HYDROcodone-acetaminophen (NORCO) 5-325 MG tablet Take 1 tablet by mouth every 6 (six) hours as needed for moderate pain. To last 30 days (Patient not taking:  Reported on 09/14/2018) 30 tablet 0  . ibuprofen (ADVIL,MOTRIN) 200 MG tablet Take 400 mg by mouth every 6 (six) hours as needed for mild pain.    Marland Kitchen KLOR-CON M20 20 MEQ tablet TAKE 2 TABLETS BY MOUTH EVERY DAY 60 tablet 2  . Lancets Misc. MISC Use as directed once daily 100 each 11  . metFORMIN (GLUCOPHAGE) 1000 MG tablet Take 1 tablet (1,000 mg total) by mouth 2 (two) times daily with a meal. 180 tablet 3  . pioglitazone (ACTOS) 45 MG tablet TAKE 1 TABLET BY MOUTH EVERY DAY 90 tablet 1  . sitaGLIPtin (JANUVIA)  100 MG tablet Take 1 tablet (100 mg total) by mouth daily. 90 tablet 3  . telmisartan (MICARDIS) 80 MG tablet TAKE 1 TABLET BY MOUTH EVERY DAY 90 tablet 0  . [DISCONTINUED] nebivolol (BYSTOLIC) 10 MG tablet Take 1 tablet (10 mg total) by mouth daily. 90 tablet 3  . [DISCONTINUED] valsartan (DIOVAN) 320 MG tablet Take 1 tablet (320 mg total) by mouth daily. 90 tablet 3   No current facility-administered medications on file prior to visit.     Observations/Objective: Alert, NAD, appropriate mood and affect, resps normal, cn 2-12 intact, moves all 4s, no visible rash or swelling Lab Results  Component Value Date   WBC 5.1 12/04/2018   HGB 11.7 (L) 12/04/2018   HCT 36.6 12/04/2018   PLT 264 12/04/2018   GLUCOSE 144 (H) 12/04/2018   CHOL 150 06/12/2018   TRIG 186.0 (H) 06/12/2018   HDL 49.00 06/12/2018   LDLCALC 64 06/12/2018   ALT 14 12/04/2018   AST 14 (L) 12/04/2018   NA 141 12/04/2018   K 4.2 12/04/2018   CL 104 12/04/2018   CREATININE 0.74 12/04/2018   BUN 13 12/04/2018   CO2 25 12/04/2018   TSH 1.40 06/12/2018   HGBA1C 8.0 (H) 06/12/2018   MICROALBUR 3.8 (H) 06/12/2018   Assessment and Plan: See notes  Follow Up Instructions: See notes   I discussed the assessment and treatment plan with the patient. The patient was provided an opportunity to ask questions and all were answered. The patient agreed with the plan and demonstrated an understanding of the instructions.   The patient was advised to call back or seek an in-person evaluation if the symptoms worsen or if the condition fails to improve as anticipated   Cathlean Cower, MD

## 2018-12-11 NOTE — Patient Instructions (Signed)
Please continue all other medications as before, and refills have been done if requested.  Please have the pharmacy call with any other refills you may need.  Please continue your efforts at being more active, low cholesterol diet, and weight control.  You are otherwise up to date with prevention measures today.  Please keep your appointments with your specialists as you may have planned  Please return in 6 months, or sooner if needed, with Lab testing done 3-5 days before  

## 2018-12-13 ENCOUNTER — Encounter: Payer: Self-pay | Admitting: Internal Medicine

## 2018-12-13 NOTE — Assessment & Plan Note (Signed)
stable overall by history and exam, recent data reviewed with pt, and pt to continue medical treatment as before,  to f/u any worsening symptoms or concerns, declines f/u lipids due to pandemic

## 2018-12-13 NOTE — Assessment & Plan Note (Signed)
stable overall by history and exam, recent data reviewed with pt, and pt to continue medical treatment as before,  to f/u any worsening symptoms or concerns  

## 2018-12-19 ENCOUNTER — Other Ambulatory Visit: Payer: Self-pay | Admitting: Internal Medicine

## 2018-12-23 ENCOUNTER — Other Ambulatory Visit: Payer: Self-pay | Admitting: Internal Medicine

## 2018-12-23 IMAGING — CT CT CHEST W/ CM
3 of 5 series · 13 of 36 positions shown, 16 images · IV contrast (APPLIED)
Comparison: 10/18/2016

CLINICAL DATA: Follow-up rectal carcinoma.

EXAM:
CT CHEST, ABDOMEN, AND PELVIS WITH CONTRAST
TECHNIQUE: Multidetector CT imaging of the chest, abdomen and pelvis was
performed following the standard protocol during bolus
administration of intravenous contrast.
CONTRAST:  100mL OIWP2N-YLL IOPAMIDOL (OIWP2N-YLL) INJECTION 61%

[Series 2: cap with · axial · 0.73mm/px · z∈[-588,-108]mm · 9 of 122 slices shown, 12 images]
[im 13/122  mediastinal]
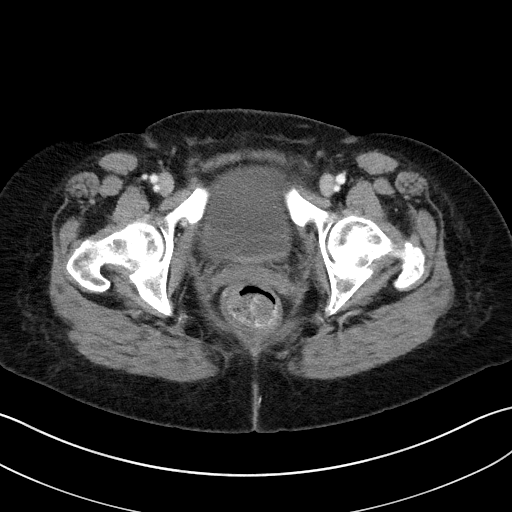
[im 13/122  lung]
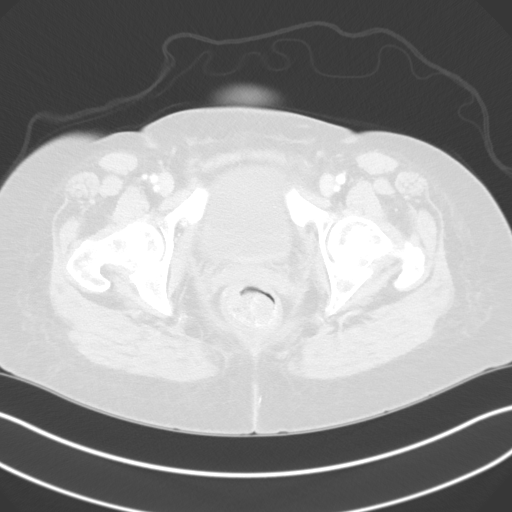
[im 25/122  lung]
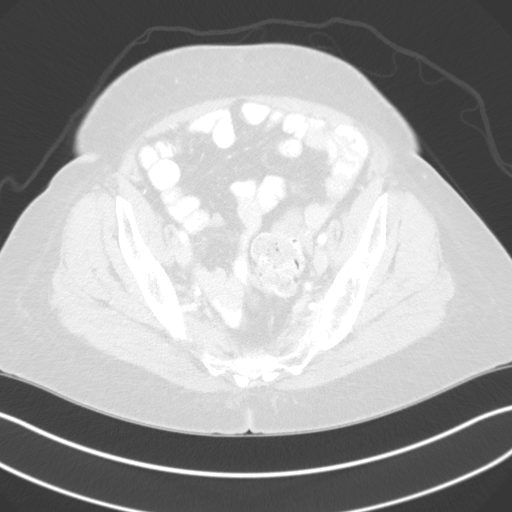
[im 37/122  lung]
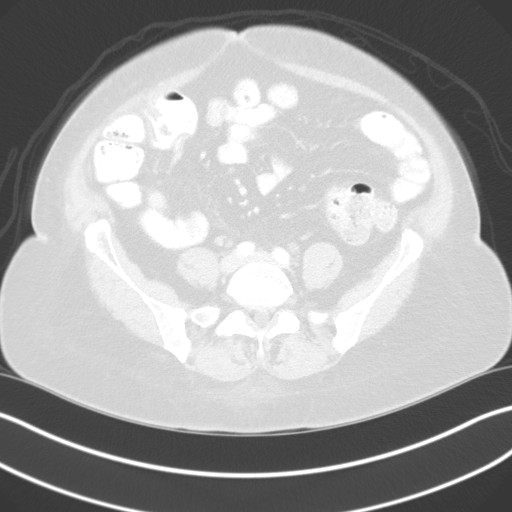
[im 49/122  lung]
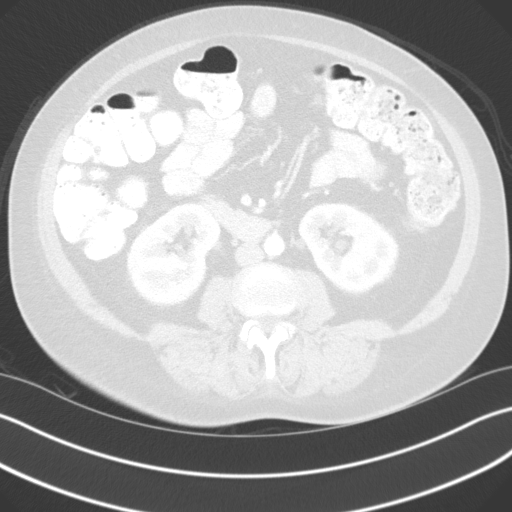
[im 61/122  mediastinal]
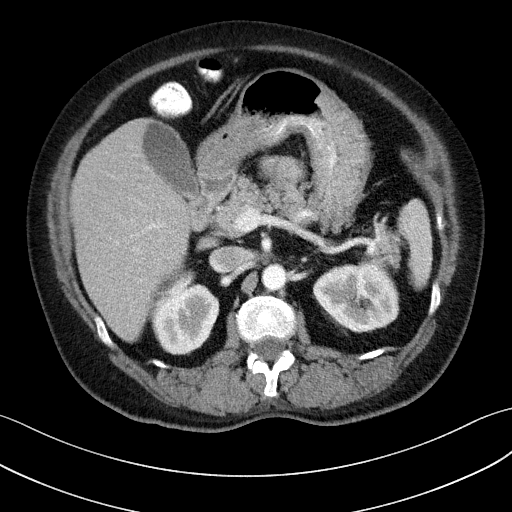
[im 61/122  lung]
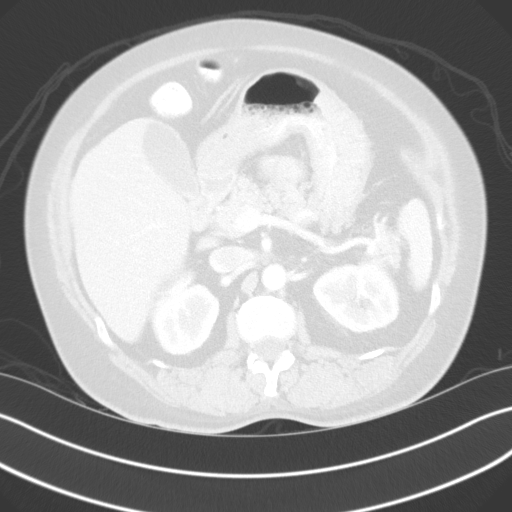
[im 73/122  lung]
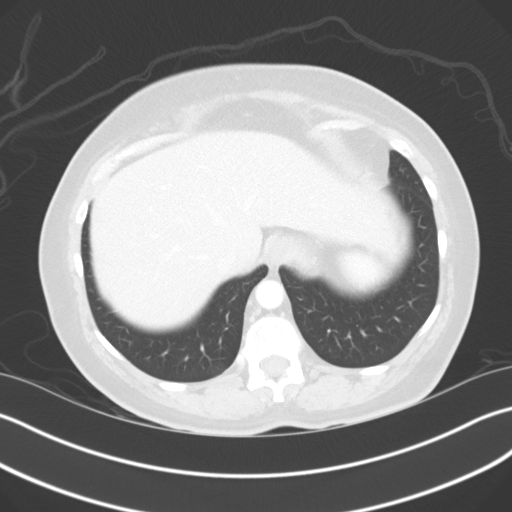
[im 85/122  lung]
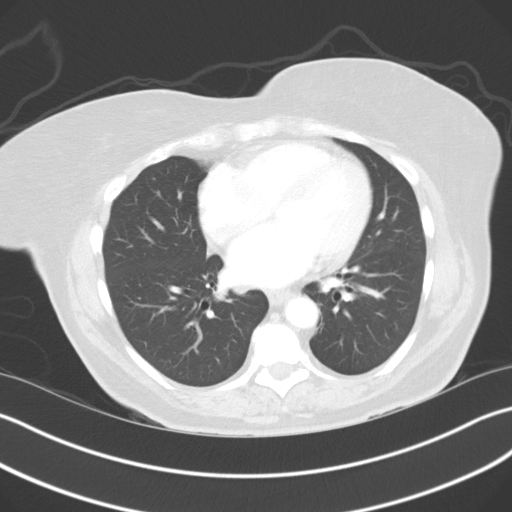
[im 97/122  lung]
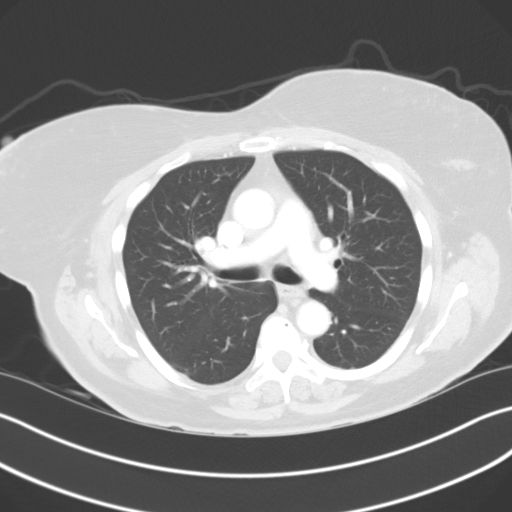
[im 109/122  mediastinal]
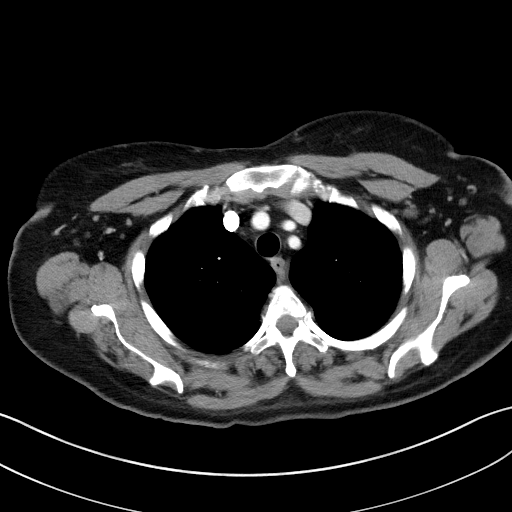
[im 109/122  lung]
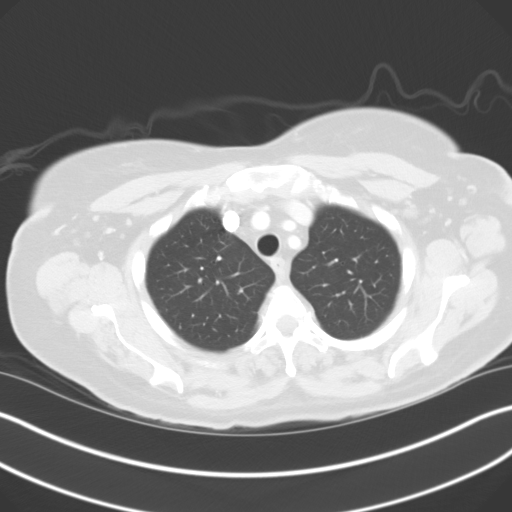

[Series 4: coronals · coronal · 0.66mm/px · 3 of 147 slices shown]
[im 30/147  lung]
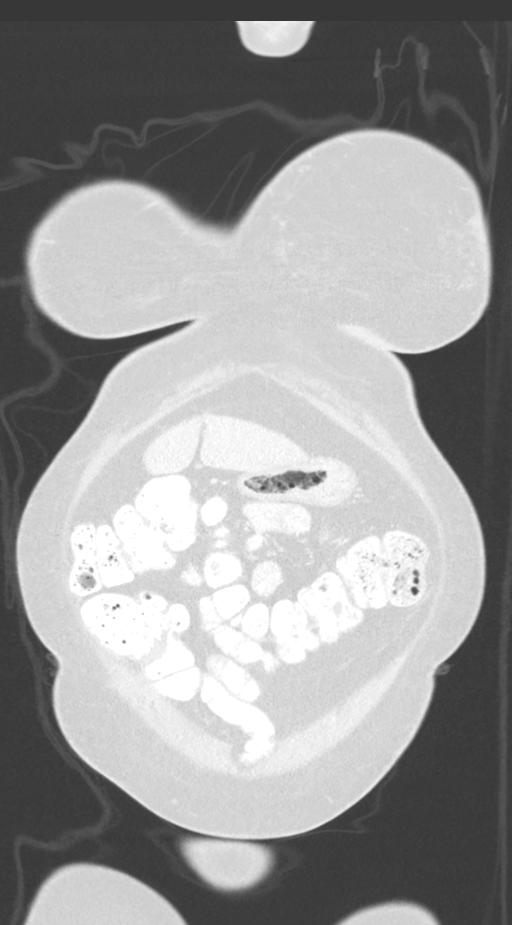
[im 59/147  lung]
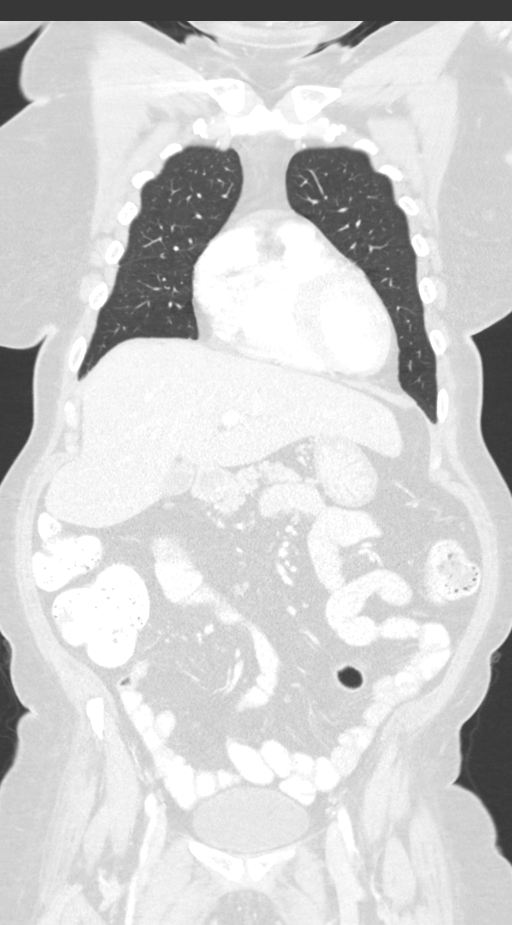
[im 88/147  lung]
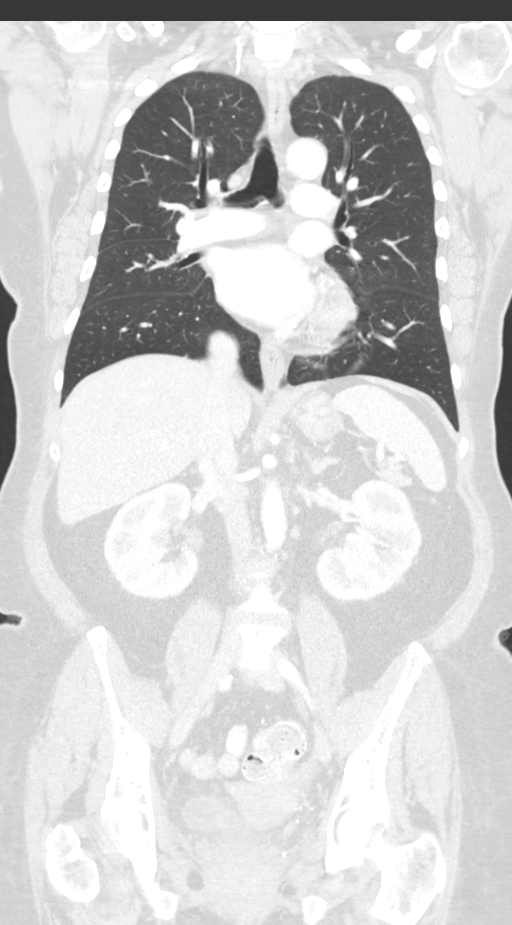

[Series 6: lung · axial · 0.73mm/px · 1 of 147 slices shown]
[im 12/147  lung]
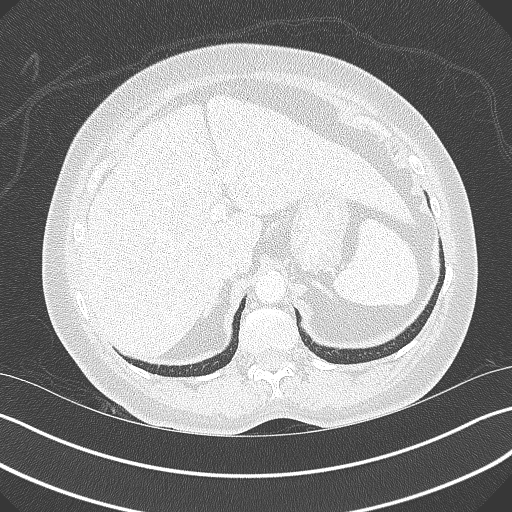

[13 of 36 positions shown; findings below may reference images not displayed]

FINDINGS: CT CHEST FINDINGS

Cardiovascular: No acute findings. Aortic and coronary artery
atherosclerosis.

Mediastinum/Lymph Nodes: No masses or pathologically enlarged lymph
nodes identified.

Lungs/Pleura: No pulmonary infiltrate or mass identified. No
effusion present.

Musculoskeletal:  No suspicious bone lesions identified.

CT ABDOMEN AND PELVIS FINDINGS

Hepatobiliary: No masses identified.

Pancreas:  No mass or inflammatory changes.

Spleen:  Within normal limits in size and appearance.

Adrenals/Urinary tract:  No masses or hydronephrosis.

Stomach/Bowel: Rectal anastomosis again seen. Mild presacral soft
tissue density is stable and consistent with post treatment changes.
No evidence of obstruction, inflammatory process, or abnormal fluid
collections.

Vascular/Lymphatic: No pathologically enlarged lymph nodes
identified. No abdominal aortic aneurysm. Aortic atherosclerosis.

Reproductive: Several small uterine fibroids again seen, largest in
left posterior fundal region measuring approximately 3.7 cm. Adnexal
regions are unremarkable.

Other:  None.

Musculoskeletal:  No suspicious bone lesions identified.
IMPRESSION: Stable post treatment changes in perirectal and presacral region. No
evidence recurrent or metastatic carcinoma.

Stable small uterine fibroids.

## 2019-01-10 ENCOUNTER — Other Ambulatory Visit: Payer: Self-pay | Admitting: Internal Medicine

## 2019-01-11 DIAGNOSIS — R69 Illness, unspecified: Secondary | ICD-10-CM | POA: Diagnosis not present

## 2019-01-15 ENCOUNTER — Encounter (INDEPENDENT_AMBULATORY_CARE_PROVIDER_SITE_OTHER): Payer: Self-pay

## 2019-01-15 ENCOUNTER — Other Ambulatory Visit: Payer: Self-pay | Admitting: Internal Medicine

## 2019-01-15 ENCOUNTER — Encounter (INDEPENDENT_AMBULATORY_CARE_PROVIDER_SITE_OTHER): Payer: Medicare HMO | Admitting: Ophthalmology

## 2019-02-02 ENCOUNTER — Other Ambulatory Visit: Payer: Self-pay | Admitting: Internal Medicine

## 2019-02-02 DIAGNOSIS — C20 Malignant neoplasm of rectum: Secondary | ICD-10-CM

## 2019-02-02 DIAGNOSIS — E876 Hypokalemia: Secondary | ICD-10-CM

## 2019-02-05 ENCOUNTER — Other Ambulatory Visit: Payer: Self-pay | Admitting: Internal Medicine

## 2019-02-10 ENCOUNTER — Other Ambulatory Visit: Payer: Self-pay | Admitting: Internal Medicine

## 2019-02-15 ENCOUNTER — Other Ambulatory Visit: Payer: Self-pay | Admitting: Internal Medicine

## 2019-02-15 DIAGNOSIS — E876 Hypokalemia: Secondary | ICD-10-CM

## 2019-02-15 DIAGNOSIS — C20 Malignant neoplasm of rectum: Secondary | ICD-10-CM

## 2019-03-15 DIAGNOSIS — Z85048 Personal history of other malignant neoplasm of rectum, rectosigmoid junction, and anus: Secondary | ICD-10-CM | POA: Diagnosis not present

## 2019-04-15 ENCOUNTER — Other Ambulatory Visit: Payer: Self-pay | Admitting: Internal Medicine

## 2019-04-15 DIAGNOSIS — C20 Malignant neoplasm of rectum: Secondary | ICD-10-CM

## 2019-04-15 DIAGNOSIS — E876 Hypokalemia: Secondary | ICD-10-CM

## 2019-04-15 NOTE — Telephone Encounter (Signed)
As per routine refill policy - I have not reviewed the details

## 2019-04-28 IMAGING — MG DIGITAL SCREENING BILATERAL MAMMOGRAM WITH CAD
1 series · 1 of 1 positions shown · non-contrast
Comparison: Previous exam(s).

CLINICAL DATA: Screening.

EXAM:
DIGITAL SCREENING BILATERAL MAMMOGRAM WITH CAD

[R CC]
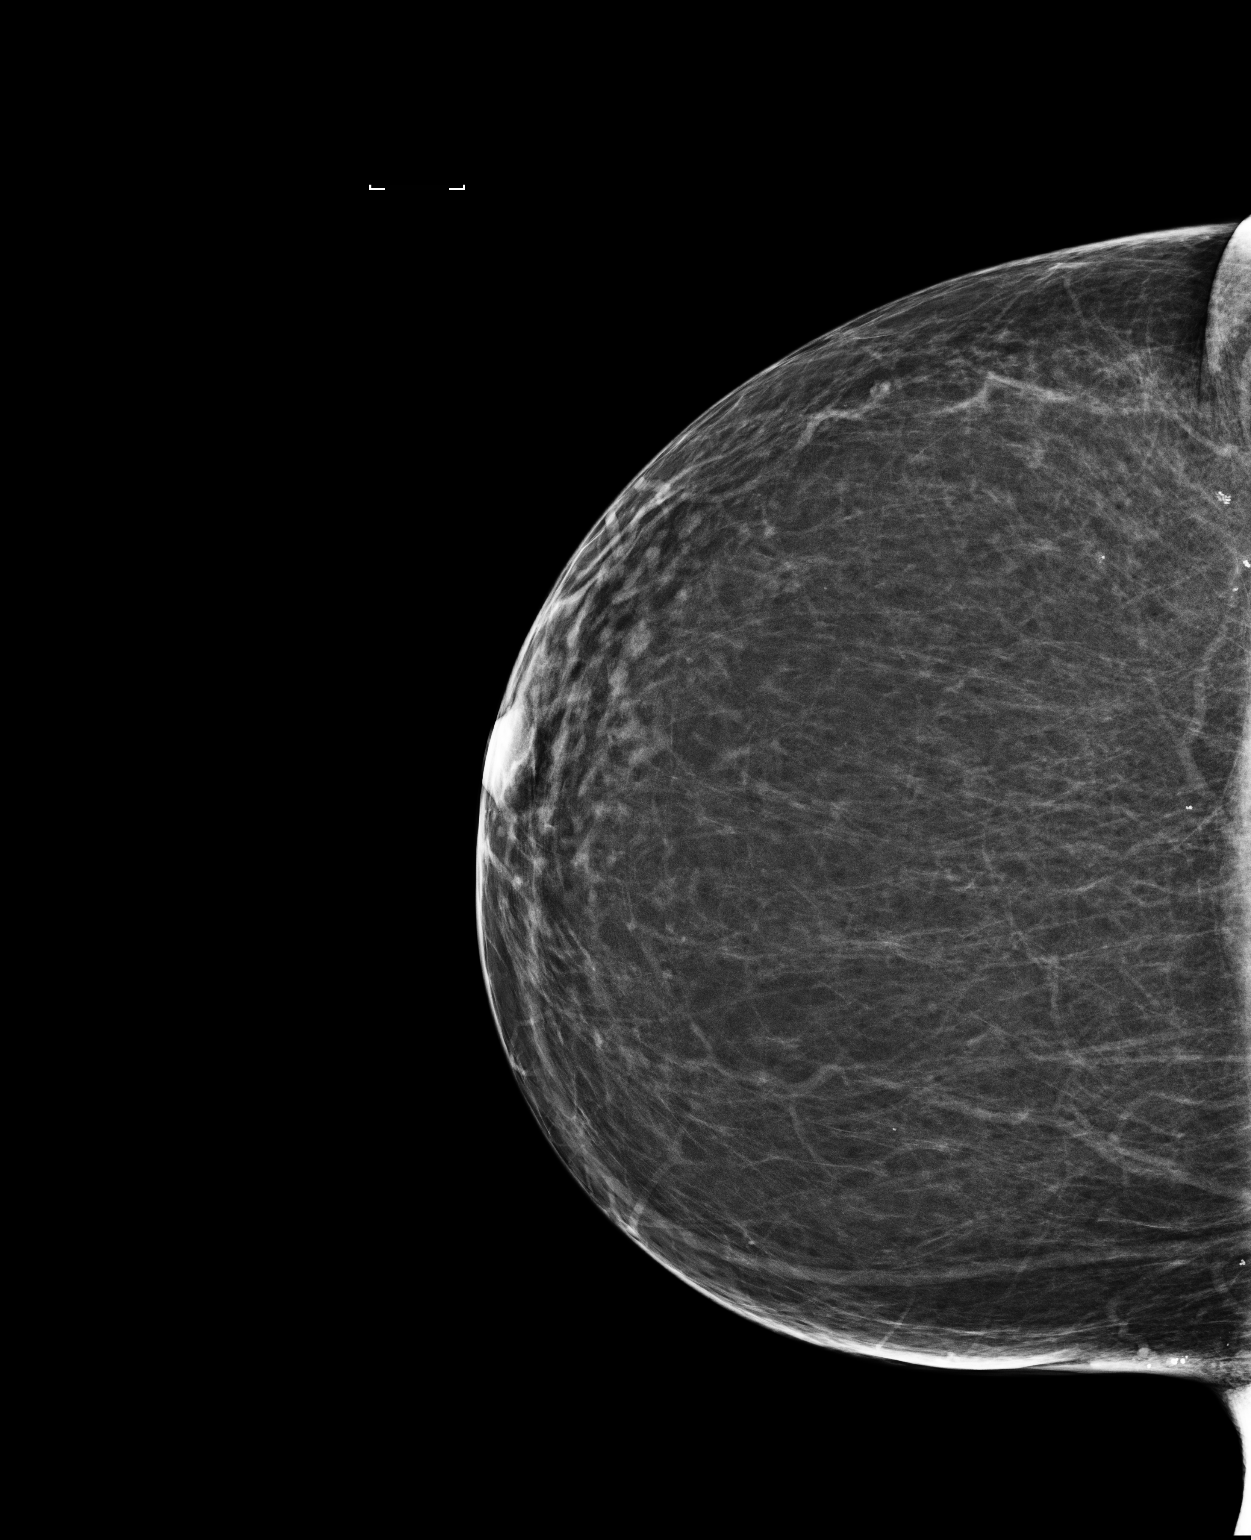

[1 of 1 positions shown; findings below may reference images not displayed]

ACR Breast Density Category b: There are scattered areas of
fibroglandular density.
FINDINGS: There are no findings suspicious for malignancy. Images were
processed with CAD.
IMPRESSION: No mammographic evidence of malignancy. A result letter of this
screening mammogram will be mailed directly to the patient.

RECOMMENDATION:
Screening mammogram in one year. (Code:AS-G-LCT)

BI-RADS CATEGORY  1: Negative.

## 2019-05-12 ENCOUNTER — Other Ambulatory Visit: Payer: Self-pay | Admitting: Internal Medicine

## 2019-05-12 DIAGNOSIS — C20 Malignant neoplasm of rectum: Secondary | ICD-10-CM

## 2019-05-12 DIAGNOSIS — E876 Hypokalemia: Secondary | ICD-10-CM

## 2019-05-13 ENCOUNTER — Other Ambulatory Visit: Payer: Self-pay | Admitting: Internal Medicine

## 2019-06-14 ENCOUNTER — Other Ambulatory Visit: Payer: Self-pay | Admitting: Internal Medicine

## 2019-06-18 ENCOUNTER — Other Ambulatory Visit: Payer: Self-pay

## 2019-06-18 MED ORDER — TELMISARTAN 80 MG PO TABS: 80.0000 mg | ORAL_TABLET | Freq: Every day | ORAL | 1 refills | Status: DC

## 2019-07-06 ENCOUNTER — Other Ambulatory Visit: Payer: Self-pay | Admitting: Internal Medicine

## 2019-07-06 NOTE — Telephone Encounter (Signed)
Please refill as per office routine med refill policy (all routine meds refilled for 3 mo or monthly per pt preference up to one year from last visit, then month to month grace period for 3 mo, then further med refills will have to be denied)  

## 2019-07-09 ENCOUNTER — Encounter: Payer: Medicare HMO | Admitting: Internal Medicine

## 2019-07-13 ENCOUNTER — Other Ambulatory Visit: Payer: Self-pay | Admitting: Internal Medicine

## 2019-07-15 ENCOUNTER — Other Ambulatory Visit: Payer: Self-pay | Admitting: Internal Medicine

## 2019-07-15 DIAGNOSIS — C20 Malignant neoplasm of rectum: Secondary | ICD-10-CM

## 2019-07-15 DIAGNOSIS — E876 Hypokalemia: Secondary | ICD-10-CM

## 2019-07-15 NOTE — Telephone Encounter (Signed)
Please refill as per office routine med refill policy (all routine meds refilled for 3 mo or monthly per pt preference up to one year from last visit, then month to month grace period for 3 mo, then further med refills will have to be denied)  

## 2019-07-23 ENCOUNTER — Other Ambulatory Visit: Payer: Self-pay | Admitting: Internal Medicine

## 2019-07-24 ENCOUNTER — Other Ambulatory Visit: Payer: Self-pay | Admitting: Internal Medicine

## 2019-07-25 NOTE — Telephone Encounter (Signed)
Please refill as per office routine med refill policy (all routine meds refilled for 3 mo or monthly per pt preference up to one year from last visit, then month to month grace period for 3 mo, then further med refills will have to be denied)  

## 2019-07-26 ENCOUNTER — Other Ambulatory Visit: Payer: Self-pay

## 2019-07-26 MED ORDER — ATENOLOL 50 MG PO TABS: 50.0000 mg | ORAL_TABLET | Freq: Every day | ORAL | 1 refills | Status: DC

## 2019-07-29 ENCOUNTER — Telehealth: Payer: Self-pay | Admitting: Hematology

## 2019-07-29 NOTE — Telephone Encounter (Signed)
Rescheduled pt appts per md email.   Called and spoke with pt and she is aware of the new appt date and time.

## 2019-08-02 NOTE — Progress Notes (Signed)
[place]   [phone] [phone]) [phone]   Clinic Follow up Note   Patient Care Team: Biagio Borg, MD as PCP - Eber Jones, MD as Consulting Physician (Radiation Oncology) Leighton Ruff, MD as Consulting Physician (General Surgery) Milus Banister, MD as Attending Physician (Gastroenterology) Tania Ade, RN as Registered Nurse  Date of Service:  08/05/2019  CHIEF COMPLAINT: Follow up rectal cancer  SUMMARY OF ONCOLOGIC HISTORY: Oncology History Overview Note  Rectal cancer [facility])   Staging form: Colon and Rectum, AJCC 7th Edition   - Clinical: Stage IIA (T3, N0, M0) - Signed by [name] Merle, MD on 10/10/2015   - Pathologic stage from 01/31/2016: Stage IIA (T3, N0, cM0) - Signed by [name] Merle, MD on 02/22/2016     Rectal cancer ([place])  09/22/2015 Imaging   CT ABD/PELVIS: Soft tissue fullness at the rectosigmoid junction. Otherwise, no acute process in the abdomen or pelvis   09/24/2015 Pathology Results   Adenocarcinoma   09/24/2015 Initial Diagnosis   Rectal cancer ([place])   09/24/2015 Procedure   COLONOSCOPY: Nonobsructive mass in rectosigmoid colon from 10-20 cum, circumferential (Dr. Collene Mares)   09/24/2015 Tumor Marker   CEA=4.1   09/25/2015 Imaging   CT CHEST: Mass within the upper-outer quadrant of the left breast warranting further evaluation. No suspicious pulmonary abnormality   10/25/2015 - 12/01/2015 Radiation Therapy   adjuvant irradiation to rectal cancer   10/25/2015 - 12/01/2015 Chemotherapy   Xeloda 1500 mg twice daily, with concurrent irradiation   01/31/2016 Surgery   Robot assisted low anterior resection of rectal cancer    01/31/2016 Pathology Results   Invasive colorectal adenocarcinoma, 3.5cm, G2, LVI(-), peri-neural invasion (-), ypT3, margins negative, 17 nodes all negative    03/11/2016 - 06/14/2016 Chemotherapy   Adjuvant Xeloda 2000 mg in a.m., 1500 mg a evening, 2 weeks on and one-week off, a total 4-5 cycles.    10/18/2016 Imaging   CT CAP 10/18/16 IMPRESSION: Status post low anterior resection with radiation changes in the presacral region.  No findings suspicious for recurrent or metastatic disease.   01/31/2017 Mammogram   IMPRESSION: No mammographic evidence of malignancy. A result letter of this screening mammogram will be mailed directly to the patient.   05/23/2017 Procedure   05/23/2017 Colonoscopy - One 3 mm polyp in the transverse colon, removed with a cold snare. Resected and retrieved. - Normal colo-colonic anastomosis about 4cm from the anal verge. - The examination was otherwise normal on direct and retroflexion views.   10/31/2017 Imaging   10/31/2017 CT CAP IMPRESSION: Stable post treatment changes in perirectal and presacral region. No evidence recurrent or metastatic carcinoma.  Stable small uterine fibroids      CURRENT THERAPY:  Surveillance  INTERVAL HISTORY:  Karen Good is here for a follow up and rectal cancer. She was last seen by me 8 months ago. She presents to the clinic alone. She notes she is doing well. She notes she is trying to lose weight and has lost 4 pounds since last visit. Her BP is improving. She plans to see her PCP today. She noes she last saw Dr Marcello Moores in 01/2019. She notes normal BM lately, no rectal bleeding.    REVIEW OF SYSTEMS:   Constitutional: Denies fevers, chills or abnormal weight loss Eyes: Denies blurriness of vision Ears, nose, mouth, throat, and face: Denies mucositis or sore throat Respiratory: Denies cough, dyspnea or wheezes Cardiovascular: Denies palpitation, chest discomfort or lower extremity swelling Gastrointestinal:  Denies nausea, heartburn or change in bowel habits Skin: Denies abnormal skin rashes Lymphatics: Denies new lymphadenopathy or easy bruising Neurological:Denies numbness, tingling or new weaknesses Behavioral/Psych: Mood is stable, no new changes  All other systems were reviewed with the patient and  are negative.  MEDICAL HISTORY:  Past Medical History:  Diagnosis Date  . Anemia   . BACK PAIN 04/13/2010  . BACK STRAIN, LUMBAR 04/13/2010  . Cancer (HCC)    rectal  . DIABETES MELLITUS, TYPE II 06/02/2008  . History of blood transfusion   . History of chemotherapy   . History of radiation therapy   . Hx of transfusion of whole blood 09/2015   prior to colon surgery - [facility]  . HYPERLIPIDEMIA 06/02/2008  . HYPERTENSION 06/02/2008    SURGICAL HISTORY: Past Surgical History:  Procedure Laterality Date  . COLONOSCOPY Left 09/24/2015   Procedure: COLONOSCOPY;  Tench: Juanita Craver, MD;  Location: [facility] ENDOSCOPY;  Service: Endoscopy;  Laterality: Left;  . COLONOSCOPY  09/2015   Dr Collene Mares  . ESOPHAGOGASTRODUODENOSCOPY N/A 09/24/2015   Procedure: ESOPHAGOGASTRODUODENOSCOPY (EGD);  Fargnoli: Juanita Craver, MD;  Location: [facility] ENDOSCOPY;  Service: Endoscopy;  Laterality: N/A;  . EUS N/A 10/05/2015   Procedure: LOWER ENDOSCOPIC ULTRASOUND (EUS);  Stangl: Milus Banister, MD;  Location: [facility] ENDOSCOPY;  Service: Endoscopy;  Laterality: N/A;  . s/p breast biopsy Left 07/2005   benign  . TUBAL LIGATION    . TUBAL LIGATION      I have reviewed the social history and family history with the patient and they are unchanged from previous note.  ALLERGIES:  has No Known Allergies.  MEDICATIONS:  Current Outpatient Medications  Medication Sig Dispense Refill  . amLODipine (NORVASC) 5 MG tablet TAKE 1 TABLET BY MOUTH EVERY DAY 90 tablet 1  . atenolol (TENORMIN) 50 MG tablet Take 1 tablet (50 mg total) by mouth daily. 270 tablet 1  . atorvastatin (LIPITOR) 20 MG tablet TAKE 1 TABLET BY MOUTH EVERY DAY 90 tablet 1  . Blood Glucose Monitoring Suppl (ONE TOUCH ULTRA 2) w/Device KIT Use as directed daily 1 each 0  . glipiZIDE (GLUCOTROL XL) 5 MG 24 hr tablet Take 2 tablets (10 mg total) by mouth daily. KEEP SCHEDULED APPT FOR FUTURE REFILLS 60 tablet 0  . glucose blood (ONE TOUCH ULTRA TEST) test strip USE AS  INSTRUCTED ONCE DAILY 100 each 12  . hydrochlorothiazide (HYDRODIURIL) 25 MG tablet TAKE 1 TABLET (25 MG TOTAL) BY MOUTH DAILY. MUST KEEP SCHEDULED APPT FOR FUTURE REFILLS 30 tablet 2  . HYDROcodone-acetaminophen (NORCO) 5-325 MG tablet Take 1 tablet by mouth every 6 (six) hours as needed for moderate pain. To last 30 days (Patient not taking: Reported on 09/14/2018) 30 tablet 0  . ibuprofen (ADVIL,MOTRIN) 200 MG tablet Take 400 mg by mouth every 6 (six) hours as needed for mild pain.    . Lancets Misc. MISC Use as directed once daily 100 each 11  . metFORMIN (GLUCOPHAGE) 1000 MG tablet Take 1 tablet (1,000 mg total) by mouth 2 (two) times daily with a meal. 180 tablet 3  . pioglitazone (ACTOS) 45 MG tablet TAKE 1 TABLET BY MOUTH EVERY DAY 90 tablet 1  . potassium chloride SA (KLOR-CON M20) 20 MEQ tablet Take 2 tablets (40 mEq total) by mouth daily. Annual appt is overdue must see provider for future refills 60 tablet 0  . sitaGLIPtin (JANUVIA) 100 MG tablet Take 1 tablet (100 mg total) by mouth daily. 90 tablet 3  .  telmisartan (MICARDIS) 80 MG tablet Take 1 tablet (80 mg total) by mouth daily. 90 tablet 1   No current facility-administered medications for this visit.    PHYSICAL EXAMINATION: ECOG PERFORMANCE STATUS: 0 - Asymptomatic  Vitals:   08/05/19 1019 08/05/19 1020  BP: (!) 207/84 (!) 160/87  Pulse: 99   Resp: 16   Temp: 98 F (36.7 C)   SpO2: 100%    Filed Weights   08/05/19 1019  Weight: 178 lb 4.8 oz (80.9 kg)    GENERAL:alert, no distress and comfortable SKIN: skin color, texture, turgor are normal, no rashes or significant lesions EYES: normal, Conjunctiva are pink and non-injected, sclera clear  NECK: supple, thyroid normal size, non-tender, without nodularity LYMPH:  no palpable lymphadenopathy in the cervical, axillary or inguinal LUNGS: clear to auscultation and percussion with normal breathing effort HEART: regular rate & rhythm and no murmurs and no lower  extremity edema ABDOMEN:abdomen soft, non-tender and normal bowel sounds Musculoskeletal:no cyanosis of digits and no clubbing  NEURO: alert & oriented x 3 with fluent speech, no focal motor/sensory deficits RECTAL: No palpable mass. No blood on glove. Normal stool present. Benign exam    LABORATORY DATA:  I have reviewed the data as listed CBC Latest Ref Rng & Units 08/05/2019 12/04/2018 06/05/2018  WBC 4.0 - 10.5 K/uL 5.2 5.1 4.6  Hemoglobin 12.0 - 15.0 g/dL 11.8(L) 11.7(L) 11.9(L)  Hematocrit 36.0 - 46.0 % 38.4 36.6 37.4  Platelets 150 - 400 K/uL 301 264 259     CMP Latest Ref Rng & Units 12/04/2018 06/05/2018 10/31/2017  Glucose 70 - 99 mg/dL 144(H) 184(H) 138(H)  BUN 8 - 23 mg/dL '13 14 11  '$ Creatinine 0.44 - 1.00 mg/dL 0.74 0.77 0.71  Sodium 135 - 145 mmol/L 141 142 141  Potassium 3.5 - 5.1 mmol/L 4.2 3.7 3.6  Chloride 98 - 111 mmol/L 104 104 102  CO2 22 - 32 mmol/L '25 27 28  '$ Calcium 8.9 - 10.3 mg/dL 9.6 9.6 10.2  Total Protein 6.5 - 8.1 g/dL 7.0 7.3 7.8  Total Bilirubin 0.3 - 1.2 mg/dL 1.0 1.0 1.0  Alkaline Phos 38 - 126 U/L 69 77 88  AST 15 - 41 U/L 14(L) 12(L) 13(L)  ALT 0 - 44 U/L '14 12 15      '$ RADIOGRAPHIC STUDIES: I have personally reviewed the radiological images as listed and agreed with the findings in the report. No results found.   ASSESSMENT & PLAN:  Karen Good is a 64 y.o. female with    1. Rectal adenocarcinoma, proximal rectum, uT3N0M0, stage IIA, ypT3N0M0 -She was diagnosed in 09/2015. She is s/p chemoRT, surgical resection and adjuvant Xeloda. -HersurveillanceColonoscopy was in 05/2017 and was normalexcept 1 benignpolyp. Will repeat in 3 years.  -She is clinically doing well. Labs reviewed, CBC and CMP WNL except Hg 11.8. Physical exam unremarkable. There is no clinical concern for recurrence.  -She is almost 4 years since her cancer diagnosis. Her risk of recurrence is minimal. Will continue 5-year surveillance plan. I do not plan to do anymore  surveillance scans unless she has concerning symptoms. She will be due for next colonoscopy in 05/2020.  -F/u in May or June 2022 for last routine visit. She will f/u with Dr. Marcello Moores in interim.    2. HTN and DM -Continue medication. -She has been working on weight loss lately. She has been able to lose a few pounds. I encouraged her to continue.  -BP has improved recently. She will  continue to f/u with her PCP.    3.Cancer screening -Previous CT chest revealed a left breast mass, however her screening mammogram was negative in 09/2015 -Her diagnostic mammogram showed the left breast lesion has been stable for a few years, likely benign, no biopsy was recommended. -Mammogram on 11/2019revealed no mammographic evidence of malignancy. Continue annual screening mammogram.   PLAN -She is clinically doing well,no concern for recurrence. -Colonoscopy in 05/2020 -Lab and f/u in May or June 2022. She will see Dr. Marcello Moores in the interim    No problem-specific [facility] notes found for this encounter.   No orders of the defined types were placed in this encounter.  All questions were answered. The patient knows to call the clinic with any problems, questions or concerns. No barriers to learning was detected. The total time spent in the appointment was 25 minutes.     [name] Merle, MD 08/05/2019   I, Joslyn Devon, am acting as scribe for [name] Merle, MD.   I have reviewed the above documentation for accuracy and completeness, and I agree with the above.

## 2019-08-05 ENCOUNTER — Encounter: Payer: Self-pay | Admitting: Hematology

## 2019-08-05 ENCOUNTER — Inpatient Hospital Stay: Payer: Medicare HMO | Attending: Hematology

## 2019-08-05 ENCOUNTER — Encounter: Payer: Self-pay | Admitting: Internal Medicine

## 2019-08-05 ENCOUNTER — Other Ambulatory Visit: Payer: Self-pay

## 2019-08-05 ENCOUNTER — Ambulatory Visit (INDEPENDENT_AMBULATORY_CARE_PROVIDER_SITE_OTHER): Payer: Medicare HMO | Admitting: Internal Medicine

## 2019-08-05 ENCOUNTER — Inpatient Hospital Stay: Payer: Medicare HMO | Admitting: Hematology

## 2019-08-05 VITALS — BP 160/87 | HR 99 | Temp 98.0°F | Resp 16 | Ht 65.0 in | Wt 178.3 lb

## 2019-08-05 VITALS — BP 160/98 | HR 95 | Temp 98.0°F | Ht 65.0 in | Wt 177.0 lb

## 2019-08-05 DIAGNOSIS — E538 Deficiency of other specified B group vitamins: Secondary | ICD-10-CM

## 2019-08-05 DIAGNOSIS — I10 Essential (primary) hypertension: Secondary | ICD-10-CM

## 2019-08-05 DIAGNOSIS — C20 Malignant neoplasm of rectum: Secondary | ICD-10-CM | POA: Insufficient documentation

## 2019-08-05 DIAGNOSIS — D500 Iron deficiency anemia secondary to blood loss (chronic): Secondary | ICD-10-CM | POA: Diagnosis not present

## 2019-08-05 DIAGNOSIS — Z0000 Encounter for general adult medical examination without abnormal findings: Secondary | ICD-10-CM

## 2019-08-05 DIAGNOSIS — E119 Type 2 diabetes mellitus without complications: Secondary | ICD-10-CM | POA: Diagnosis not present

## 2019-08-05 DIAGNOSIS — E559 Vitamin D deficiency, unspecified: Secondary | ICD-10-CM

## 2019-08-05 DIAGNOSIS — R3129 Other microscopic hematuria: Secondary | ICD-10-CM

## 2019-08-05 DIAGNOSIS — Z0001 Encounter for general adult medical examination with abnormal findings: Secondary | ICD-10-CM

## 2019-08-05 LAB — LIPID PANEL
Cholesterol: 159 mg/dL (ref 0–200)
HDL: 58.6 mg/dL (ref >39.00)
LDL Cholesterol: 76 mg/dL (ref 0–99)
NonHDL: 100.45
Total CHOL/HDL Ratio: 3
Triglycerides: 120 mg/dL (ref 0.0–149.0)
VLDL: 24 mg/dL (ref 0.0–40.0)

## 2019-08-05 LAB — URINALYSIS, ROUTINE W REFLEX MICROSCOPIC
Bilirubin Urine: NEGATIVE
Ketones, ur: NEGATIVE
Leukocytes,Ua: NEGATIVE
Nitrite: NEGATIVE
Specific Gravity, Urine: 1.02 (ref 1.000–1.030)
Total Protein, Urine: 30 — AB
Urine Glucose: NEGATIVE
Urobilinogen, UA: 0.2 (ref 0.0–1.0)
pH: 5.5 (ref 5.0–8.0)

## 2019-08-05 LAB — COMPREHENSIVE METABOLIC PANEL
ALT: 13 U/L (ref 0–44)
AST: 12 U/L — ABNORMAL LOW (ref 15–41)
Albumin: 4.2 g/dL (ref 3.5–5.0)
Alkaline Phosphatase: 70 U/L (ref 38–126)
Anion gap: 11 (ref 5–15)
BUN: 17 mg/dL (ref 8–23)
CO2: 26 mmol/L (ref 22–32)
Calcium: 9.7 mg/dL (ref 8.9–10.3)
Chloride: 104 mmol/L (ref 98–111)
Creatinine, Ser: 0.72 mg/dL (ref 0.44–1.00)
GFR calc Af Amer: >60 mL/min (ref >60)
GFR calc non Af Amer: >60 mL/min (ref >60)
Glucose, Bld: 171 mg/dL — ABNORMAL HIGH (ref 70–99)
Potassium: 4.1 mmol/L (ref 3.5–5.1)
Sodium: 141 mmol/L (ref 135–145)
Total Bilirubin: 0.9 mg/dL (ref 0.3–1.2)
Total Protein: 7.5 g/dL (ref 6.5–8.1)

## 2019-08-05 LAB — IBC PANEL
Iron: 54 ug/dL (ref 42–145)
Saturation Ratios: 10.2 % — ABNORMAL LOW (ref 20.0–50.0)
Transferrin: 377 mg/dL — ABNORMAL HIGH (ref 212.0–360.0)

## 2019-08-05 LAB — CBC WITH DIFFERENTIAL/PLATELET
Abs Immature Granulocytes: 0.02 10*3/uL (ref 0.00–0.07)
Basophils Absolute: 0 10*3/uL (ref 0.0–0.1)
Basophils Relative: 0 %
Eosinophils Absolute: 0 10*3/uL (ref 0.0–0.5)
Eosinophils Relative: 1 %
HCT: 38.4 % (ref 36.0–46.0)
Hemoglobin: 11.8 g/dL — ABNORMAL LOW (ref 12.0–15.0)
Immature Granulocytes: 0 %
Lymphocytes Relative: 20 %
Lymphs Abs: 1 10*3/uL (ref 0.7–4.0)
MCH: 26.6 pg (ref 26.0–34.0)
MCHC: 30.7 g/dL (ref 30.0–36.0)
MCV: 86.7 fL (ref 80.0–100.0)
Monocytes Absolute: 0.3 10*3/uL (ref 0.1–1.0)
Monocytes Relative: 6 %
Neutro Abs: 3.8 10*3/uL (ref 1.7–7.7)
Neutrophils Relative %: 73 %
Platelets: 301 10*3/uL (ref 150–400)
RBC: 4.43 MIL/uL (ref 3.87–5.11)
RDW: 15.1 % (ref 11.5–15.5)
WBC: 5.2 10*3/uL (ref 4.0–10.5)
nRBC: 0 % (ref 0.0–0.2)

## 2019-08-05 LAB — MICROALBUMIN / CREATININE URINE RATIO
Creatinine,U: 28.1 mg/dL
Microalb Creat Ratio: 134 mg/g — ABNORMAL HIGH (ref 0.0–30.0)
Microalb, Ur: 37.7 mg/dL — ABNORMAL HIGH (ref 0.0–1.9)

## 2019-08-05 LAB — TSH: TSH: 1.94 u[IU]/mL (ref 0.35–4.50)

## 2019-08-05 LAB — VITAMIN D 25 HYDROXY (VIT D DEFICIENCY, FRACTURES): VITD: 34.31 ng/mL (ref 30.00–100.00)

## 2019-08-05 LAB — VITAMIN B12: Vitamin B-12: 174 pg/mL — ABNORMAL LOW (ref 211–911)

## 2019-08-05 LAB — HEMOGLOBIN A1C: Hgb A1c MFr Bld: 7.8 % — ABNORMAL HIGH (ref 4.6–6.5)

## 2019-08-05 NOTE — Patient Instructions (Signed)

## 2019-08-05 NOTE — Progress Notes (Signed)
Subjective:    Patient ID: Karen Good, female    DOB: 1956-01-11, 64 y.o.   MRN: [id number]  HPI  Here for wellness and f/u;  Overall doing ok;  Pt denies Chest pain, worsening SOB, DOE, wheezing, orthopnea, PND, worsening LE edema, palpitations, dizziness or syncope.  Pt denies neurological change such as new headache, facial or extremity weakness.  Pt denies polydipsia, polyuria, or low sugar symptoms. Pt states overall good compliance with treatment and medications, good tolerability, and has been trying to follow appropriate diet.  Pt denies worsening depressive symptoms, suicidal ideation or panic. No fever, night sweats, wt loss, loss of appetite, or other constitutional symptoms.  Pt states good ability with ADL's, has low fall risk, home safety reviewed and adequate, no other significant changes in hearing or vision, and only occasionally active with exercise. No overt bleeding.  Denies urinary symptoms such as dysuria, frequency, urgency, flank pain, hematuria or n/v, fever, chills. Plans to call for next eye appt.  Wt Readings from Last 3 Encounters:  08/05/19 177 lb (80.3 kg)  08/05/19 178 lb 4.8 oz (80.9 kg)  12/04/18 180 lb 3.2 oz (81.7 kg)   Past Medical History:  Diagnosis Date  . Anemia   . BACK PAIN 04/13/2010  . BACK STRAIN, LUMBAR 04/13/2010  . Cancer (HCC)    rectal  . DIABETES MELLITUS, TYPE II 06/02/2008  . History of blood transfusion   . History of chemotherapy   . History of radiation therapy   . Hx of transfusion of whole blood 09/2015   prior to colon surgery - [facility]  . HYPERLIPIDEMIA 06/02/2008  . HYPERTENSION 06/02/2008   Past Surgical History:  Procedure Laterality Date  . COLONOSCOPY Left 09/24/2015   Procedure: COLONOSCOPY;  Clouse: Juanita Craver, MD;  Location[facility] ENDOSCOPY;  Service: Endoscopy;  Laterality: Left;  . COLONOSCOPY  09/2015   Dr Collene Mares  . ESOPHAGOGASTRODUODENOSCOPY N/A 09/24/2015   Procedure: ESOPHAGOGASTRODUODENOSCOPY (EGD);  Hancher:  Juanita Craver, MD;  Location: [facility] ENDOSCOPY;  Service: Endoscopy;  Laterality: N/A;  . EUS N/A 10/05/2015   Procedure: LOWER ENDOSCOPIC ULTRASOUND (EUS);  Antolin: Milus Banister, MD;  Location: [facility] ENDOSCOPY;  Service: Endoscopy;  Laterality: N/A;  . s/p breast biopsy Left 07/2005   benign  . TUBAL LIGATION    . TUBAL LIGATION      reports that she has never smoked. She has never used smokeless tobacco. She reports current alcohol use. She reports that she does not use drugs. family history includes Diabetes in her brother, father, mother, sister, and another family member; Heart disease in her father; Hypertension in her father. No Known Allergies Current Outpatient Medications on File Prior to Visit  Medication Sig Dispense Refill  . amLODipine (NORVASC) 5 MG tablet TAKE 1 TABLET BY MOUTH EVERY DAY 90 tablet 1  . atenolol (TENORMIN) 50 MG tablet Take 1 tablet (50 mg total) by mouth daily. 270 tablet 1  . atorvastatin (LIPITOR) 20 MG tablet TAKE 1 TABLET BY MOUTH EVERY DAY 90 tablet 1  . Blood Glucose Monitoring Suppl (ONE TOUCH ULTRA 2) w/Device KIT Use as directed daily 1 each 0  . glipiZIDE (GLUCOTROL XL) 5 MG 24 hr tablet Take 2 tablets (10 mg total) by mouth daily. KEEP SCHEDULED APPT FOR FUTURE REFILLS 60 tablet 0  . glucose blood (ONE TOUCH ULTRA TEST) test strip USE AS INSTRUCTED ONCE DAILY 100 each 12  . hydrochlorothiazide (HYDRODIURIL) 25 MG tablet TAKE 1 TABLET (25 MG TOTAL)  BY MOUTH DAILY. MUST KEEP SCHEDULED APPT FOR FUTURE REFILLS 30 tablet 2  . HYDROcodone-acetaminophen (NORCO) 5-325 MG tablet Take 1 tablet by mouth every 6 (six) hours as needed for moderate pain. To last 30 days 30 tablet 0  . ibuprofen (ADVIL,MOTRIN) 200 MG tablet Take 400 mg by mouth every 6 (six) hours as needed for mild pain.    . Lancets Misc. MISC Use as directed once daily 100 each 11  . metFORMIN (GLUCOPHAGE) 1000 MG tablet Take 1 tablet (1,000 mg total) by mouth 2 (two) times daily with a meal. 180  tablet 3  . pioglitazone (ACTOS) 45 MG tablet TAKE 1 TABLET BY MOUTH EVERY DAY 90 tablet 1  . potassium chloride SA (KLOR-CON M20) 20 MEQ tablet Take 2 tablets (40 mEq total) by mouth daily. Annual appt is overdue must see provider for future refills 60 tablet 0  . sitaGLIPtin (JANUVIA) 100 MG tablet Take 1 tablet (100 mg total) by mouth daily. 90 tablet 3  . telmisartan (MICARDIS) 80 MG tablet Take 1 tablet (80 mg total) by mouth daily. 90 tablet 1  . [DISCONTINUED] nebivolol (BYSTOLIC) 10 MG tablet Take 1 tablet (10 mg total) by mouth daily. 90 tablet 3  . [DISCONTINUED] valsartan (DIOVAN) 320 MG tablet Take 1 tablet (320 mg total) by mouth daily. 90 tablet 3   No current facility-administered medications on file prior to visit.   Review of Systems All otherwise neg per pt     Objective:   Physical Exam BP (!) 160/98 (BP Location: Left Arm, Patient Position: Sitting, Cuff Size: Large)   Pulse 95   Temp 98 F (36.7 C) (Oral)   Ht '5\' 5"'$  (1.651 m)   Wt 177 lb (80.3 kg)   SpO2 99%   BMI 29.45 kg/m  VS noted,  Constitutional: Pt appears in NAD HENT: Head: NCAT.  Right Ear: External ear normal.  Left Ear: External ear normal.  Eyes: . Pupils are equal, round, and reactive to light. Conjunctivae and EOM are normal Nose: without d/c or deformity Neck: Neck supple. Gross normal ROM Cardiovascular: Normal rate and regular rhythm.   Pulmonary/Chest: Effort normal and breath sounds without rales or wheezing.  Abd:  Soft, NT, ND, + BS, no organomegaly Neurological: Pt is alert. At baseline orientation, motor grossly intact Skin: Skin is warm. No rashes, other new lesions, no LE edema Psychiatric: Pt behavior is normal without agitation  All otherwise neg per pt Lab Results  Component Value Date   WBC 5.2 08/05/2019   HGB 11.8 (L) 08/05/2019   HCT 38.4 08/05/2019   PLT 301 08/05/2019   GLUCOSE 171 (H) 08/05/2019   CHOL 159 08/05/2019   TRIG 120.0 08/05/2019   HDL 58.60 08/05/2019    LDLCALC 76 08/05/2019   ALT 13 08/05/2019   AST 12 (L) 08/05/2019   NA 141 08/05/2019   K 4.1 08/05/2019   CL 104 08/05/2019   CREATININE 0.72 08/05/2019   BUN 17 08/05/2019   CO2 26 08/05/2019   TSH 1.94 08/05/2019   HGBA1C 7.8 (H) 08/05/2019   MICROALBUR 37.7 (H) 08/05/2019      Assessment & Plan:

## 2019-08-06 ENCOUNTER — Other Ambulatory Visit: Payer: Medicare HMO

## 2019-08-06 ENCOUNTER — Ambulatory Visit: Payer: Medicare HMO | Admitting: Hematology

## 2019-08-06 ENCOUNTER — Other Ambulatory Visit: Payer: Self-pay | Admitting: Internal Medicine

## 2019-08-06 ENCOUNTER — Encounter: Payer: Self-pay | Admitting: Internal Medicine

## 2019-08-06 ENCOUNTER — Telehealth: Payer: Self-pay | Admitting: Hematology

## 2019-08-06 MED ORDER — VITAMIN B-12 1000 MCG PO TABS: 1000.0000 ug | ORAL_TABLET | Freq: Every day | ORAL | 3 refills | Status: DC

## 2019-08-06 NOTE — Telephone Encounter (Signed)
Scheduled appts per 4/22 los. Left voicemail with next appt details. Sent msg to HIM to mail appt reminder and calendar.

## 2019-08-07 ENCOUNTER — Encounter: Payer: Self-pay | Admitting: Internal Medicine

## 2019-08-07 NOTE — Assessment & Plan Note (Signed)
stable overall by history and exam, recent data reviewed with pt, and pt to continue medical treatment as before,  to f/u any worsening symptoms or concerns  

## 2019-08-07 NOTE — Assessment & Plan Note (Signed)
States bp at home < 140/90, o/w stable overall by history and exam, recent data reviewed with pt, and pt to continue medical treatment as before,  to f/u any worsening symptoms or concerns

## 2019-08-07 NOTE — Assessment & Plan Note (Signed)

## 2019-08-07 NOTE — Assessment & Plan Note (Addendum)
Declines urology for now,  to f/u any worsening symptoms or concerns  I spent 32 minutes in addition to time for CPX wellness examination in preparing to see the patient by review of recent labs, imaging and procedures, obtaining and reviewing separately obtained history, communicating with the patient and family or caregiver, ordering medications, tests or procedures, and documenting clinical information in the EHR including the differential Dx, treatment, and any further evaluation and other management of microhematuria, dM, iron def anemia, hTN

## 2019-08-07 NOTE — Assessment & Plan Note (Signed)
Cont oral replaement, f/u. lab

## 2019-08-16 ENCOUNTER — Other Ambulatory Visit: Payer: Self-pay | Admitting: Internal Medicine

## 2019-08-16 DIAGNOSIS — C20 Malignant neoplasm of rectum: Secondary | ICD-10-CM

## 2019-08-16 DIAGNOSIS — E876 Hypokalemia: Secondary | ICD-10-CM

## 2019-08-28 ENCOUNTER — Other Ambulatory Visit: Payer: Self-pay | Admitting: Internal Medicine

## 2019-08-28 NOTE — Telephone Encounter (Signed)
Please refill as per office routine med refill policy (all routine meds refilled for 3 mo or monthly per pt preference up to one year from last visit, then month to month grace period for 3 mo, then further med refills will have to be denied)  

## 2019-09-17 ENCOUNTER — Other Ambulatory Visit: Payer: Self-pay | Admitting: Internal Medicine

## 2019-10-11 ENCOUNTER — Other Ambulatory Visit: Payer: Self-pay | Admitting: Internal Medicine

## 2019-10-28 ENCOUNTER — Other Ambulatory Visit: Payer: Self-pay | Admitting: Internal Medicine

## 2019-11-09 ENCOUNTER — Other Ambulatory Visit: Payer: Self-pay | Admitting: Internal Medicine

## 2019-11-10 NOTE — Telephone Encounter (Signed)
Please refill as per office routine med refill policy (all routine meds refilled for 3 mo or monthly per pt preference up to one year from last visit, then month to month grace period for 3 mo, then further med refills will have to be denied)  

## 2019-12-07 ENCOUNTER — Telehealth: Payer: Self-pay | Admitting: Internal Medicine

## 2019-12-07 NOTE — Telephone Encounter (Signed)
New Message:   Pt is calling and states that her JANUVIA 100 MG tablet used to be $47 a month and as of yesterday when she went to pick it up it has changed to $78. The pt would like to know if there are some coupons or something that can help her with this price. Please advise.

## 2019-12-08 NOTE — Telephone Encounter (Signed)
If possible, please ask pt to check her insurance covered med list (in a booklet she may have gotten or online), or ask the pharmacist if another similar medication is less expensive now such as invokana or tradjenta

## 2019-12-08 NOTE — Telephone Encounter (Signed)
Sent to Dr. John. 

## 2019-12-10 NOTE — Telephone Encounter (Signed)
LDVM for pt of Dr. Gwynn Burly note and to call the office with the updated medication.

## 2019-12-27 ENCOUNTER — Telehealth: Payer: Self-pay | Admitting: Emergency Medicine

## 2019-12-27 NOTE — Telephone Encounter (Signed)
Pt dropped off patient assistance paperwork and put it in MD's box to be filled out.

## 2019-12-28 ENCOUNTER — Other Ambulatory Visit: Payer: Self-pay | Admitting: Internal Medicine

## 2019-12-28 MED ORDER — SITAGLIPTIN PHOSPHATE 100 MG PO TABS: 100.0000 mg | ORAL_TABLET | Freq: Every day | ORAL | 3 refills | Status: DC

## 2020-01-03 NOTE — Telephone Encounter (Signed)
Patient is calling to follow up on patient assistance forms. She states she is running out of the medication. She has about 3 pills left and wanted to know if we have samples until she get the refill.   Informed patient a refill was called in on 12/28/19. sitaGLIPtin (JANUVIA) 100 MG tablet

## 2020-01-04 NOTE — Telephone Encounter (Signed)
The forms were signed  I have no other influence over the process  I can send a small rx to her pharmacy if she likes

## 2020-01-04 NOTE — Telephone Encounter (Signed)
Sent to Dr. Jenny Reichmann asking for forms.

## 2020-01-05 NOTE — Telephone Encounter (Signed)
Spoke with pt and informed her that we do have samples in office and that I have pulled 3 boxes for her to come pick up today before 5pm.  **I will check with [place] about the forms as well.

## 2020-01-17 DIAGNOSIS — R69 Illness, unspecified: Secondary | ICD-10-CM | POA: Diagnosis not present

## 2020-01-31 ENCOUNTER — Other Ambulatory Visit: Payer: Medicare HMO

## 2020-02-04 ENCOUNTER — Ambulatory Visit: Payer: Medicare HMO | Admitting: Internal Medicine

## 2020-02-07 ENCOUNTER — Other Ambulatory Visit: Payer: Self-pay

## 2020-02-07 ENCOUNTER — Other Ambulatory Visit (INDEPENDENT_AMBULATORY_CARE_PROVIDER_SITE_OTHER): Payer: Medicare HMO

## 2020-02-07 DIAGNOSIS — E119 Type 2 diabetes mellitus without complications: Secondary | ICD-10-CM | POA: Diagnosis not present

## 2020-02-07 LAB — HEPATIC FUNCTION PANEL
ALT: 12 U/L (ref 0–35)
AST: 13 U/L (ref 0–37)
Albumin: 4.3 g/dL (ref 3.5–5.2)
Alkaline Phosphatase: 69 U/L (ref 39–117)
Bilirubin, Direct: 0.2 mg/dL (ref 0.0–0.3)
Total Bilirubin: 0.7 mg/dL (ref 0.2–1.2)
Total Protein: 6.7 g/dL (ref 6.0–8.3)

## 2020-02-07 LAB — LIPID PANEL
Cholesterol: 149 mg/dL (ref 0–200)
HDL: 52.6 mg/dL (ref >39.00)
LDL Cholesterol: 74 mg/dL (ref 0–99)
NonHDL: 96.59
Total CHOL/HDL Ratio: 3
Triglycerides: 113 mg/dL (ref 0.0–149.0)
VLDL: 22.6 mg/dL (ref 0.0–40.0)

## 2020-02-07 LAB — BASIC METABOLIC PANEL
BUN: 16 mg/dL (ref 6–23)
CO2: 29 mEq/L (ref 19–32)
Calcium: 9.7 mg/dL (ref 8.4–10.5)
Chloride: 102 mEq/L (ref 96–112)
Creatinine, Ser: 0.63 mg/dL (ref 0.40–1.20)
GFR: 92.49 mL/min (ref >60.00)
Glucose, Bld: 131 mg/dL — ABNORMAL HIGH (ref 70–99)
Potassium: 3.8 mEq/L (ref 3.5–5.1)
Sodium: 140 mEq/L (ref 135–145)

## 2020-02-07 LAB — HEMOGLOBIN A1C: Hgb A1c MFr Bld: 8 % — ABNORMAL HIGH (ref 4.6–6.5)

## 2020-02-10 ENCOUNTER — Other Ambulatory Visit: Payer: Self-pay

## 2020-02-10 ENCOUNTER — Encounter: Payer: Self-pay | Admitting: Internal Medicine

## 2020-02-10 ENCOUNTER — Ambulatory Visit (INDEPENDENT_AMBULATORY_CARE_PROVIDER_SITE_OTHER): Payer: Medicare HMO | Admitting: Internal Medicine

## 2020-02-10 VITALS — BP 144/80 | HR 97 | Temp 99.6°F | Ht 65.0 in | Wt 179.0 lb

## 2020-02-10 DIAGNOSIS — E559 Vitamin D deficiency, unspecified: Secondary | ICD-10-CM | POA: Diagnosis not present

## 2020-02-10 DIAGNOSIS — E1165 Type 2 diabetes mellitus with hyperglycemia: Secondary | ICD-10-CM | POA: Diagnosis not present

## 2020-02-10 DIAGNOSIS — E785 Hyperlipidemia, unspecified: Secondary | ICD-10-CM | POA: Diagnosis not present

## 2020-02-10 DIAGNOSIS — I10 Essential (primary) hypertension: Secondary | ICD-10-CM | POA: Diagnosis not present

## 2020-02-10 DIAGNOSIS — E538 Deficiency of other specified B group vitamins: Secondary | ICD-10-CM | POA: Diagnosis not present

## 2020-02-10 MED ORDER — TRULICITY 0.75 MG/0.5ML ~~LOC~~ SOAJ
0.7500 mg | SUBCUTANEOUS | 3 refills | Status: DC
Start: 2020-02-10 — End: 2020-02-21

## 2020-02-10 NOTE — Progress Notes (Signed)
Subjective:    Patient ID: Karen Good, female    DOB: August 07, 1955, 64 y.o.   MRN: [id number]    Hpi;  Here to f/u; overall doing ok,  Pt denies chest pain, increasing sob or doe, wheezing, orthopnea, PND, [name], palpitations, dizziness or syncope.  Pt denies new neurological symptoms such as new headache, or facial or extremity weakness or numbness.  Pt denies polydipsia, polyuria, or low sugar episode.  Pt states overall good compliance with meds, mostly trying to follow appropriate diet, with wt overall stable,  but little exercise however.  Karen Good is expensive.  BP at home < 140/90 but not checked recently.   Past Medical History:  Diagnosis Date  . Anemia   . BACK PAIN 04/13/2010  . BACK STRAIN, LUMBAR 04/13/2010  . Cancer (HCC)    rectal  . DIABETES MELLITUS, TYPE II 06/02/2008  . History of blood transfusion   . History of chemotherapy   . History of radiation therapy   . Hx of transfusion of whole blood 09/2015   prior to colon surgery - [facility]  . HYPERLIPIDEMIA 06/02/2008  . HYPERTENSION 06/02/2008   Past Surgical History:  Procedure Laterality Date  . COLONOSCOPY Left 09/24/2015   Procedure: COLONOSCOPY;  Paternostro: Juanita Craver, MD;  Location: [facility] ENDOSCOPY;  Service: Endoscopy;  Laterality: Left;  . COLONOSCOPY  09/2015   Dr Collene Mares  . ESOPHAGOGASTRODUODENOSCOPY N/A 09/24/2015   Procedure: ESOPHAGOGASTRODUODENOSCOPY (EGD);  Kray: Juanita Craver, MD;  Location: [facility] ENDOSCOPY;  Service: Endoscopy;  Laterality: N/A;  . EUS N/A 10/05/2015   Procedure: LOWER ENDOSCOPIC ULTRASOUND (EUS);  Hitz: Milus Banister, MD;  Location: [facility] ENDOSCOPY;  Service: Endoscopy;  Laterality: N/A;  . s/p breast biopsy Left 07/2005   benign  . TUBAL LIGATION    . TUBAL LIGATION      reports that she has never smoked. She has never used smokeless tobacco. She reports current alcohol use. She reports that she does not use drugs. family history includes Diabetes in her brother, father,  mother, sister, and another family member; Heart disease in her father; Hypertension in her father. No Known Allergies Current Outpatient Medications on File Prior to Visit  Medication Sig Dispense Refill  . amLODipine (NORVASC) 5 MG tablet TAKE 1 TABLET BY MOUTH EVERY DAY 90 tablet 1  . atenolol (TENORMIN) 50 MG tablet Take 1 tablet (50 mg total) by mouth daily. 270 tablet 1  . atorvastatin (LIPITOR) 20 MG tablet TAKE 1 TABLET BY MOUTH EVERY DAY 90 tablet 1  . Blood Glucose Monitoring Suppl (ONE TOUCH ULTRA 2) w/Device KIT Use as directed daily 1 each 0  . glipiZIDE (GLUCOTROL XL) 5 MG 24 hr tablet Take 2 tablets (10 mg total) by mouth daily. 180 tablet 3  . glucose blood (ONE TOUCH ULTRA TEST) test strip USE AS INSTRUCTED ONCE DAILY 100 each 12  . hydrochlorothiazide (HYDRODIURIL) 25 MG tablet TAKE 1 TABLET (25 MG TOTAL) BY MOUTH DAILY. MUST KEEP SCHEDULED APPT FOR FUTURE REFILLS 90 tablet 2  . HYDROcodone-acetaminophen (NORCO) 5-325 MG tablet Take 1 tablet by mouth every 6 (six) hours as needed for moderate pain. To last 30 days 30 tablet 0  . ibuprofen (ADVIL,MOTRIN) 200 MG tablet Take 400 mg by mouth every 6 (six) hours as needed for mild pain.    . Lancets Misc. MISC Use as directed once daily 100 each 11  . metFORMIN (GLUCOPHAGE) 1000 MG tablet TAKE 1 TABLET (1,000 MG TOTAL) BY MOUTH  2 (TWO) TIMES DAILY WITH A MEAL. 180 tablet 3  . pioglitazone (ACTOS) 45 MG tablet TAKE 1 TABLET BY MOUTH EVERY DAY 90 tablet 1  . potassium chloride SA (KLOR-CON M20) 20 MEQ tablet Take 2 tablets (40 mEq total) by mouth daily. 180 tablet 3  . telmisartan (MICARDIS) 80 MG tablet TAKE 1 TABLET BY MOUTH EVERY DAY 90 tablet 1  . vitamin B-12 (CYANOCOBALAMIN) 1000 MCG tablet Take 1 tablet (1,000 mcg total) by mouth daily. 90 tablet 3  . [DISCONTINUED] nebivolol (BYSTOLIC) 10 MG tablet Take 1 tablet (10 mg total) by mouth daily. 90 tablet 3  . [DISCONTINUED] valsartan (DIOVAN) 320 MG tablet Take 1 tablet (320 mg  total) by mouth daily. 90 tablet 3   No current facility-administered medications on file prior to visit.   Review of Systems All otherwise neg per pt     Objective:   Physical Exam BP (!) 144/80   Pulse 97   Temp 99.6 F (37.6 C) (Oral)   Ht $R'5\' 5"'XE$  (1.651 m)   Wt 179 lb (81.2 kg)   SpO2 95%   BMI 29.79 kg/m  VS noted,  Constitutional: Pt appears in NAD HENT: Head: NCAT.  Right Ear: External ear normal.  Left Ear: External ear normal.  Eyes: . Pupils are equal, round, and reactive to light. Conjunctivae and EOM are normal Nose: without d/c or deformity Neck: Neck supple. Gross normal ROM Cardiovascular: Normal rate and regular rhythm.   Pulmonary/Chest: Effort normal and breath sounds without rales or wheezing.  Abd:  Soft, NT, ND, + BS, no organomegaly Neurological: Pt is alert. At baseline orientation, motor grossly intact Skin: Skin is warm. No rashes, other new lesions, no LE edema Psychiatric: Pt behavior is normal without agitation  All otherwise neg per pt Lab Results  Component Value Date   WBC 5.2 08/05/2019   HGB 11.8 (L) 08/05/2019   HCT 38.4 08/05/2019   PLT 301 08/05/2019   GLUCOSE 131 (H) 02/07/2020   CHOL 149 02/07/2020   TRIG 113.0 02/07/2020   HDL 52.60 02/07/2020   LDLCALC 74 02/07/2020   ALT 12 02/07/2020   AST 13 02/07/2020   NA 140 02/07/2020   K 3.8 02/07/2020   CL 102 02/07/2020   CREATININE 0.63 02/07/2020   BUN 16 02/07/2020   CO2 29 02/07/2020   TSH 1.94 08/05/2019   HGBA1C 8.0 (H) 02/07/2020   MICROALBUR 37.7 (H) 08/05/2019      Assessment & Plan:

## 2020-02-10 NOTE — Assessment & Plan Note (Signed)
Pt to follow bp at home daily for 10 days, and call with average

## 2020-02-10 NOTE — Patient Instructions (Signed)
Ok to stop the [place]  Please take all new medication as prescribed  - the trulicity  Please continue all other medications as before, and refills have been done if requested.  Please have the pharmacy call with any other refills you may need.  Please continue your efforts at being more active, low cholesterol diet, and weight control.  Please keep your appointments with your specialists as you may have planned  Please make an Appointment to return in 6 months, or sooner if needed, also with Lab Appointment for testing done 3-5 days before at the [facility] (so this is for TWO appointments - please see the scheduling desk as you leave)

## 2020-02-10 NOTE — Assessment & Plan Note (Signed)
Not improved on januvia, to d/c januvia, for trulicity

## 2020-02-10 NOTE — Assessment & Plan Note (Signed)
stable overall by history and exam, recent data reviewed with pt, and pt to continue medical treatment as before,  to f/u any worsening symptoms or concerns  

## 2020-02-17 ENCOUNTER — Telehealth: Payer: Self-pay | Admitting: Internal Medicine

## 2020-02-17 NOTE — Telephone Encounter (Signed)
    Patient calling to report Dulaglutide (TRULICITY) 9.21 BW/3.7NG SOPN is too costly ($600)

## 2020-02-18 NOTE — Telephone Encounter (Signed)
Sent to Dr. John. 

## 2020-02-18 NOTE — Telephone Encounter (Signed)
Could you please ask pt to inquire at pharmacy regarding the least costly alternative, as they would abe able to tell her, where I could not  She could also try calling her insurancre

## 2020-02-21 MED ORDER — SITAGLIPTIN PHOSPHATE 100 MG PO TABS: 100.0000 mg | ORAL_TABLET | Freq: Every day | ORAL | 3 refills | Status: DC

## 2020-02-21 NOTE — Telephone Encounter (Signed)
Ok I sent to [facility] ,  [facility] that is ok

## 2020-02-21 NOTE — Telephone Encounter (Signed)
Spoke with pt and was able to inform her of Dr. Gwynn Burly advice. Pt has stated she would like to go back on Januvia.

## 2020-02-24 ENCOUNTER — Telehealth: Payer: Self-pay | Admitting: Internal Medicine

## 2020-02-24 NOTE — Telephone Encounter (Signed)
I was able to speak the pt and inform her we do have some samples in office that is available.  ** I was able to pull the 4 boxes for the pt as she is awaiting for her rx to be mailed.

## 2020-02-24 NOTE — Telephone Encounter (Signed)
sitaGLIPtin (JANUVIA) 100 MG tablet Patient is waiting for her refill to come in the mail and is wondering if she can get samples from the office for a week since she is completely out Patient # [phone]

## 2020-03-21 DIAGNOSIS — Z85048 Personal history of other malignant neoplasm of rectum, rectosigmoid junction, and anus: Secondary | ICD-10-CM | POA: Diagnosis not present

## 2020-03-23 ENCOUNTER — Other Ambulatory Visit: Payer: Self-pay | Admitting: Internal Medicine

## 2020-07-16 ENCOUNTER — Other Ambulatory Visit: Payer: Self-pay | Admitting: Internal Medicine

## 2020-07-16 NOTE — Telephone Encounter (Signed)
Please refill as per office routine med refill policy (all routine meds refilled for 3 mo or monthly per pt preference up to one year from last visit, then month to month grace period for 3 mo, then further med refills will have to be denied)  

## 2020-07-30 ENCOUNTER — Other Ambulatory Visit: Payer: Self-pay | Admitting: Internal Medicine

## 2020-07-30 DIAGNOSIS — C20 Malignant neoplasm of rectum: Secondary | ICD-10-CM

## 2020-07-30 DIAGNOSIS — E876 Hypokalemia: Secondary | ICD-10-CM

## 2020-07-30 NOTE — Telephone Encounter (Signed)
Please refill as per office routine med refill policy (all routine meds refilled for 3 mo or monthly per pt preference up to one year from last visit, then month to month grace period for 3 mo, then further med refills will have to be denied)  

## 2020-08-14 ENCOUNTER — Ambulatory Visit (INDEPENDENT_AMBULATORY_CARE_PROVIDER_SITE_OTHER): Payer: Medicare HMO | Admitting: Internal Medicine

## 2020-08-14 ENCOUNTER — Other Ambulatory Visit: Payer: Self-pay

## 2020-08-14 ENCOUNTER — Encounter: Payer: Self-pay | Admitting: Internal Medicine

## 2020-08-14 VITALS — BP 140/90 | HR 99 | Temp 98.0°F | Ht 65.0 in | Wt 176.0 lb

## 2020-08-14 DIAGNOSIS — E538 Deficiency of other specified B group vitamins: Secondary | ICD-10-CM

## 2020-08-14 DIAGNOSIS — E559 Vitamin D deficiency, unspecified: Secondary | ICD-10-CM | POA: Diagnosis not present

## 2020-08-14 DIAGNOSIS — E1165 Type 2 diabetes mellitus with hyperglycemia: Secondary | ICD-10-CM

## 2020-08-14 DIAGNOSIS — Z0000 Encounter for general adult medical examination without abnormal findings: Secondary | ICD-10-CM | POA: Diagnosis not present

## 2020-08-14 DIAGNOSIS — I10 Essential (primary) hypertension: Secondary | ICD-10-CM

## 2020-08-14 DIAGNOSIS — Z0001 Encounter for general adult medical examination with abnormal findings: Secondary | ICD-10-CM

## 2020-08-14 DIAGNOSIS — E7800 Pure hypercholesterolemia, unspecified: Secondary | ICD-10-CM

## 2020-08-14 LAB — CBC WITH DIFFERENTIAL/PLATELET
Basophils Absolute: 0 K/uL (ref 0.0–0.1)
Basophils Relative: 0.7 % (ref 0.0–3.0)
Eosinophils Absolute: 0.1 K/uL (ref 0.0–0.7)
Eosinophils Relative: 4.6 % (ref 0.0–5.0)
HCT: 34.9 % — ABNORMAL LOW (ref 36.0–46.0)
Hemoglobin: 11.4 g/dL — ABNORMAL LOW (ref 12.0–15.0)
Lymphocytes Relative: 15 % (ref 12.0–46.0)
Lymphs Abs: 0.4 K/uL — ABNORMAL LOW (ref 0.7–4.0)
MCHC: 32.5 g/dL (ref 30.0–36.0)
MCV: 83.5 fl (ref 78.0–100.0)
Monocytes Absolute: 0.3 K/uL (ref 0.1–1.0)
Monocytes Relative: 9.9 % (ref 3.0–12.0)
Neutro Abs: 1.9 K/uL (ref 1.4–7.7)
Neutrophils Relative %: 69.8 % (ref 43.0–77.0)
Platelets: 240 K/uL (ref 150.0–400.0)
RBC: 4.18 Mil/uL (ref 3.87–5.11)
RDW: 14.8 % (ref 11.5–15.5)
WBC: 2.7 K/uL — ABNORMAL LOW (ref 4.0–10.5)

## 2020-08-14 LAB — URINALYSIS, ROUTINE W REFLEX MICROSCOPIC
Bilirubin Urine: NEGATIVE
Ketones, ur: NEGATIVE
Leukocytes,Ua: NEGATIVE
Nitrite: NEGATIVE
Specific Gravity, Urine: 1.025 (ref 1.000–1.030)
Total Protein, Urine: NEGATIVE
Urine Glucose: 100 — AB
Urobilinogen, UA: 0.2 (ref 0.0–1.0)
pH: 5 (ref 5.0–8.0)

## 2020-08-14 LAB — MICROALBUMIN / CREATININE URINE RATIO
Creatinine,U: 40.6 mg/dL
Microalb Creat Ratio: 20.3 mg/g (ref 0.0–30.0)
Microalb, Ur: 8.2 mg/dL — ABNORMAL HIGH (ref 0.0–1.9)

## 2020-08-14 LAB — LIPID PANEL
Cholesterol: 148 mg/dL (ref 0–200)
HDL: 50.2 mg/dL (ref >39.00)
LDL Cholesterol: 70 mg/dL (ref 0–99)
NonHDL: 98.02
Total CHOL/HDL Ratio: 3
Triglycerides: 141 mg/dL (ref 0.0–149.0)
VLDL: 28.2 mg/dL (ref 0.0–40.0)

## 2020-08-14 LAB — HEPATIC FUNCTION PANEL
ALT: 15 U/L (ref 0–35)
AST: 16 U/L (ref 0–37)
Albumin: 4.4 g/dL (ref 3.5–5.2)
Alkaline Phosphatase: 66 U/L (ref 39–117)
Bilirubin, Direct: 0.2 mg/dL (ref 0.0–0.3)
Total Bilirubin: 0.7 mg/dL (ref 0.2–1.2)
Total Protein: 7.2 g/dL (ref 6.0–8.3)

## 2020-08-14 LAB — BASIC METABOLIC PANEL
BUN: 14 mg/dL (ref 6–23)
CO2: 28 mEq/L (ref 19–32)
Calcium: 9.8 mg/dL (ref 8.4–10.5)
Chloride: 102 mEq/L (ref 96–112)
Creatinine, Ser: 0.58 mg/dL (ref 0.40–1.20)
GFR: 94.01 mL/min (ref >60.00)
Glucose, Bld: 119 mg/dL — ABNORMAL HIGH (ref 70–99)
Potassium: 3.9 mEq/L (ref 3.5–5.1)
Sodium: 140 mEq/L (ref 135–145)

## 2020-08-14 LAB — VITAMIN B12: Vitamin B-12: 1499 pg/mL — ABNORMAL HIGH (ref 211–911)

## 2020-08-14 LAB — TSH: TSH: 1.58 u[IU]/mL (ref 0.35–4.50)

## 2020-08-14 LAB — VITAMIN D 25 HYDROXY (VIT D DEFICIENCY, FRACTURES): VITD: 36.62 ng/mL (ref 30.00–100.00)

## 2020-08-14 LAB — HEMOGLOBIN A1C: Hgb A1c MFr Bld: 7.3 % — ABNORMAL HIGH (ref 4.6–6.5)

## 2020-08-14 MED ORDER — GLUCOSE BLOOD VI STRP: ORAL_STRIP | 12 refills | Status: AC

## 2020-08-14 NOTE — Progress Notes (Signed)
Patient ID: Karen Good, female   DOB: Jul 07, 1955, 65 y.o.   MRN: [id number]         Chief Complaint:: wellness exam and Diabetes (Follow up, pt states she needs a refill on glucose strips)         HPI:  Karen Good is a 65 y.o. female here for wellness exam; pt will call for pap and mamogram, and has colonoscopy sched for may 20.  O/w up to date with preventive referrals and immunizations                        Also Lost 3 lbs with better diet.   Pt denies polydipsia, polyuria, or new focal neuro s/s.  Pt denies chest pain, increased sob or doe, wheezing, orthopnea, PND, [name], palpitations, dizziness or syncope.   Pt denies fever, wt loss, night sweats, loss of appetite, or other constitutional symptoms  No other new complaints Wt Readings from Last 3 Encounters:  08/18/20 176 lb (79.8 kg)  08/14/20 176 lb (79.8 kg)  02/10/20 179 lb (81.2 kg)   BP Readings from Last 3 Encounters:  08/14/20 140/90  02/10/20 (!) 144/80  08/05/19 (!) 160/98   Immunization History  Administered Date(s) Administered  . Influenza Whole 01/14/2008  . Influenza, High Dose Seasonal PF 01/11/2019  . Influenza-Unspecified 01/13/2017, 01/06/2018, 01/17/2020  . PFIZER(Purple Top)SARS-COV-2 Vaccination 06/08/2019, 06/29/2019, 01/17/2020, 08/12/2020  . Pneumococcal Conjugate-13 01/29/2013  . Pneumococcal Polysaccharide-23 06/02/2008, 01/18/2011  . Td 06/02/2008   Health Maintenance Due  Topic Date Due  . OPHTHALMOLOGY EXAM  06/06/2019      Past Medical History:  Diagnosis Date  . Anemia    hx of   . BACK PAIN 04/13/2010  . BACK STRAIN, LUMBAR 04/13/2010  . Cancer ([place]) 2017   rectal CA  . DIABETES MELLITUS, TYPE II 06/02/2008   on meds  . History of blood transfusion   . History of chemotherapy   . History of radiation therapy   . Hx of transfusion of whole blood 09/2015   prior to colon surgery - [facility]  . HYPERLIPIDEMIA 06/02/2008   on meds  . HYPERTENSION 06/02/2008   on  meds   Past Surgical History:  Procedure Laterality Date  . COLONOSCOPY Left 09/24/2015   Procedure: COLONOSCOPY;  Bitting: Juanita Craver, MD;  Location: [facility] ENDOSCOPY;  Service: Endoscopy;  Laterality: Left;  . COLONOSCOPY  09/2015   Dr Delos Haring [place] -suprep(exc)MAC-TA  . ESOPHAGOGASTRODUODENOSCOPY N/A 09/24/2015   Procedure: ESOPHAGOGASTRODUODENOSCOPY (EGD);  Ballo: Juanita Craver, MD;  Location: [facility] ENDOSCOPY;  Service: Endoscopy;  Laterality: N/A;  . EUS N/A 10/05/2015   Procedure: LOWER ENDOSCOPIC ULTRASOUND (EUS);  Haggard: Milus Banister, MD;  Location: [facility] ENDOSCOPY;  Service: Endoscopy;  Laterality: N/A;  . s/p breast biopsy Left 07/2005   benign  . TUBAL LIGATION      reports that she has never smoked. She has never used smokeless tobacco. She reports current alcohol use. She reports that she does not use drugs. family history includes Diabetes in her brother, father, mother, sister, and another family member; Heart disease in her father; Hypertension in her father. No Known Allergies Current Outpatient Medications on File Prior to Visit  Medication Sig Dispense Refill  . amLODipine (NORVASC) 5 MG tablet TAKE 1 TABLET BY MOUTH EVERY DAY 30 tablet 0  . atenolol (TENORMIN) 50 MG tablet TAKE 1 TABLET BY MOUTH EVERY DAY 30 tablet 0  . atorvastatin (LIPITOR)  20 MG tablet TAKE 1 TABLET BY MOUTH EVERY DAY 90 tablet 1  . Blood Glucose Monitoring Suppl (ONE TOUCH ULTRA 2) w/Device KIT Use as directed daily 1 each 0  . [employer] VITAMIN B12 1000 MCG tablet TAKE 1 TABLET BY MOUTH EVERY DAY 30 tablet 0  . glipiZIDE (GLUCOTROL XL) 5 MG 24 hr tablet TAKE 2 TABLETS BY MOUTH EVERY DAY 60 tablet 0  . hydrochlorothiazide (HYDRODIURIL) 25 MG tablet TAKE 1 TABLET (25 MG TOTAL) BY MOUTH DAILY. MUST KEEP SCHEDULED APPT FOR FUTURE REFILLS 30 tablet 0  . ibuprofen (ADVIL,MOTRIN) 200 MG tablet Take 400 mg by mouth every 6 (six) hours as needed for mild pain.    Marland Kitchen KLOR-CON M20 20 MEQ tablet TAKE 2 TABLETS BY MOUTH  DAILY 60 tablet 0  . Lancets Misc. MISC Use as directed once daily 100 each 11  . metFORMIN (GLUCOPHAGE) 1000 MG tablet TAKE 1 TABLET (1,000 MG TOTAL) BY MOUTH 2 (TWO) TIMES DAILY WITH A MEAL. 180 tablet 3  . pioglitazone (ACTOS) 45 MG tablet TAKE 1 TABLET BY MOUTH EVERY DAY 30 tablet 0  . sitaGLIPtin (JANUVIA) 100 MG tablet Take 1 tablet (100 mg total) by mouth daily. 90 tablet 3  . telmisartan (MICARDIS) 80 MG tablet TAKE 1 TABLET BY MOUTH EVERY DAY 30 tablet 0  . [DISCONTINUED] nebivolol (BYSTOLIC) 10 MG tablet Take 1 tablet (10 mg total) by mouth daily. 90 tablet 3  . [DISCONTINUED] valsartan (DIOVAN) 320 MG tablet Take 1 tablet (320 mg total) by mouth daily. 90 tablet 3   No current facility-administered medications on file prior to visit.        ROS:  All others reviewed and negative.  Objective        PE:  BP 140/90 (BP Location: Right Arm, Patient Position: Sitting, Cuff Size: Normal)   Pulse 99   Temp 98 F (36.7 C) (Oral)   Ht _0  (1.651 m)   Wt 176 lb (79.8 kg)   SpO2 95%   BMI 29.29 kg/m                 Constitutional: Pt appears in NAD               HENT: Head: NCAT.                Right Ear: External ear normal.                 Left Ear: External ear normal.                Eyes: . Pupils are equal, round, and reactive to light. Conjunctivae and EOM are normal               Nose: without d/c or deformity               Neck: Neck supple. Gross normal ROM               Cardiovascular: Normal rate and regular rhythm.                 Pulmonary/Chest: Effort normal and breath sounds without rales or wheezing.                Abd:  Soft, NT, ND, + BS, no organomegaly               Neurological: Pt is alert. At baseline orientation, motor grossly intact  Skin: Skin is warm. No rashes, no other new lesions, LE edema - none               Psychiatric: Pt behavior is normal without agitation   Micro: none  Cardiac tracings I have personally interpreted today:   none  Pertinent Radiological findings (summarize): none   Lab Results  Component Value Date   WBC 2.7 (L) 08/14/2020   HGB 11.4 (L) 08/14/2020   HCT 34.9 (L) 08/14/2020   PLT 240.0 08/14/2020   GLUCOSE 119 (H) 08/14/2020   CHOL 148 08/14/2020   TRIG 141.0 08/14/2020   HDL 50.20 08/14/2020   LDLCALC 70 08/14/2020   ALT 15 08/14/2020   AST 16 08/14/2020   NA 140 08/14/2020   K 3.9 08/14/2020   CL 102 08/14/2020   CREATININE 0.58 08/14/2020   BUN 14 08/14/2020   CO2 28 08/14/2020   TSH 1.58 08/14/2020   HGBA1C 7.3 (H) 08/14/2020   MICROALBUR 8.2 (H) 08/14/2020   Assessment/Plan:  [place] Kosch Aikens is a 65 y.o. Other or two or more races [6] female with  has a past medical history of Anemia, BACK PAIN (04/13/2010), BACK STRAIN, LUMBAR (04/13/2010), Cancer ([place]) (2017), DIABETES MELLITUS, TYPE II (06/02/2008), History of blood transfusion, History of chemotherapy, History of radiation therapy, transfusion of whole blood (09/2015), HYPERLIPIDEMIA (06/02/2008), and HYPERTENSION (06/02/2008).  Preventative health care Age and sex appropriate education and counseling updated with regular exercise and diet Referrals for preventative services - pt to call for pap and mammogram, and colonoscopy is scheduled Immunizations addressed - none needed Smoking counseling  - none needed Evidence for depression or other mood disorder - none significant Most recent labs reviewed. I have personally reviewed and have noted: 1) the patient's medical and social history 2) The patient's current medications and supplements 3) The patient's height, weight, and BMI have been recorded in the chart   Hyperlipidemia Lab Results  Component Value Date   [place] 70 08/14/2020   Stable, pt to continue current statin lipitor 20  Essential hypertension BP Readings from Last 3 Encounters:  08/14/20 140/90  02/10/20 (!) 144/80  08/05/19 (!) 160/98   Stable, pt to continue medical treatment norvasc,  tenormin, hct   Diabetes ([place]) Lab Results  Component Value Date   HGBA1C 7.3 (H) 08/14/2020   Stable, pt to continue current medical treatment glucotrol, metformin , actos, januvia   Followup: Return in about 6 months (around 02/14/2021).  Cathlean Cower, MD 08/20/2020 7:35 PM [facility]

## 2020-08-14 NOTE — Patient Instructions (Signed)

## 2020-08-18 ENCOUNTER — Other Ambulatory Visit: Payer: Self-pay

## 2020-08-18 ENCOUNTER — Ambulatory Visit (AMBULATORY_SURGERY_CENTER): Payer: Self-pay

## 2020-08-18 VITALS — Ht 65.0 in | Wt 176.0 lb

## 2020-08-18 DIAGNOSIS — Z85038 Personal history of other malignant neoplasm of large intestine: Secondary | ICD-10-CM

## 2020-08-18 DIAGNOSIS — Z86010 Personal history of colonic polyps: Secondary | ICD-10-CM

## 2020-08-18 MED ORDER — NA SULFATE-K SULFATE-MG SULF 17.5-3.13-1.6 GM/177ML PO SOLN: 1.0000 | Freq: Once | ORAL | 0 refills | Status: AC

## 2020-08-18 NOTE — Progress Notes (Signed)
No egg or soy allergy known to patient  No issues with past sedation with any surgeries or procedures Patient denies ever being told they had issues or difficulty with intubation  No FH of Malignant Hyperthermia No diet pills per patient No home 02 use per patient  No blood thinners per patient  Pt denies issues with constipation  No A fib or A flutter  COVID 19 guidelines implemented in [facility] today with Pt and RN  NO PA's for preps discussed with pt in PV today  Discussed with pt there will be an out-of-pocket cost for prep and that varies from $0 to 70 dollars  Due to the COVID-19 pandemic we are asking patients to follow certain guidelines.  Pt aware of COVID protocols and [facility] guidelines   

## 2020-08-20 ENCOUNTER — Encounter: Payer: Self-pay | Admitting: Internal Medicine

## 2020-08-20 NOTE — Assessment & Plan Note (Signed)
Lab Results  Component Value Date   LDLCALC 70 08/14/2020   Stable, pt to continue current statin lipitor 20

## 2020-08-20 NOTE — Assessment & Plan Note (Signed)
Lab Results  Component Value Date   HGBA1C 7.3 (H) 08/14/2020   Stable, pt to continue current medical treatment glucotrol, metformin , actos, [place]

## 2020-08-20 NOTE — Assessment & Plan Note (Signed)
BP Readings from Last 3 Encounters:  08/14/20 140/90  02/10/20 (!) 144/80  08/05/19 (!) 160/98   Stable, pt to continue medical treatment norvasc, tenormin, hct

## 2020-08-20 NOTE — Assessment & Plan Note (Signed)
Age and sex appropriate education and counseling updated with regular exercise and diet Referrals for preventative services - pt to call for pap and mammogram, and colonoscopy is scheduled Immunizations addressed - none needed Smoking counseling  - none needed Evidence for depression or other mood disorder - none significant Most recent labs reviewed. I have personally reviewed and have noted: 1) the patient's medical and social history 2) The patient's current medications and supplements 3) The patient's height, weight, and BMI have been recorded in the chart

## 2020-08-22 ENCOUNTER — Telehealth: Payer: Self-pay | Admitting: Gastroenterology

## 2020-08-22 DIAGNOSIS — Z86010 Personal history of colonic polyps: Secondary | ICD-10-CM

## 2020-08-22 DIAGNOSIS — Z85038 Personal history of other malignant neoplasm of large intestine: Secondary | ICD-10-CM

## 2020-08-22 MED ORDER — PEG 3350-KCL-NA BICARB-NACL 420 G PO SOLR: 4000.0000 mL | Freq: Once | ORAL | 0 refills | Status: AC

## 2020-08-22 NOTE — Telephone Encounter (Signed)
Inbound call from patient. Suprep is $120 asking if there is any coupon or a cheaper option she can have. Best contact number [phone]

## 2020-08-22 NOTE — Telephone Encounter (Signed)
Golytely Rx sent to pt's pharmacy. New prep instructions emailed to pt. Pt is aware. Patient is aware she will need to drink all of this solution.

## 2020-08-27 ENCOUNTER — Other Ambulatory Visit: Payer: Self-pay | Admitting: Internal Medicine

## 2020-08-27 NOTE — Telephone Encounter (Signed)
Please refill as per office routine med refill policy (all routine meds refilled for 3 mo or monthly per pt preference up to one year from last visit, then month to month grace period for 3 mo, then further med refills will have to be denied)  

## 2020-08-28 ENCOUNTER — Other Ambulatory Visit: Payer: Self-pay | Admitting: Internal Medicine

## 2020-08-28 NOTE — Telephone Encounter (Signed)
Please refill as per office routine med refill policy (all routine meds refilled for 3 mo or monthly per pt preference up to one year from last visit, then month to month grace period for 3 mo, then further med refills will have to be denied)  

## 2020-08-29 ENCOUNTER — Encounter: Payer: Self-pay | Admitting: Gastroenterology

## 2020-08-30 NOTE — Progress Notes (Signed)
[place]   [phone] [phone]   Clinic Follow up Note   Patient Care Team: Biagio Borg, MD as PCP - Eber Jones, MD as Consulting Physician (Radiation Oncology) Leighton Ruff, MD as Consulting Physician (General Surgery) Milus Banister, MD as Attending Physician (Gastroenterology) Tania Ade, RN as Registered Nurse  Date of Service:  09/04/2020  CHIEF COMPLAINT: Follow up rectal cancer  SUMMARY OF ONCOLOGIC HISTORY: Oncology History Overview Note  Rectal cancer [facility])   Staging form: Colon and Rectum, AJCC 7th Edition   - Clinical: Stage IIA (T3, N0, M0) - Signed by [name] Merle, MD on 10/10/2015   - Pathologic stage from 01/31/2016: Stage IIA (T3, N0, cM0) - Signed by [name] Merle, MD on 02/22/2016     Rectal cancer ([place])  09/22/2015 Imaging   CT ABD/PELVIS: Soft tissue fullness at the rectosigmoid junction. Otherwise, no acute process in the abdomen or pelvis   09/24/2015 Pathology Results   Adenocarcinoma   09/24/2015 Initial Diagnosis   Rectal cancer ([place])   09/24/2015 Procedure   COLONOSCOPY: Nonobsructive mass in rectosigmoid colon from 10-20 cum, circumferential (Dr. Collene Mares)   09/24/2015 Tumor Marker   CEA=4.1   09/25/2015 Imaging   CT CHEST: Mass within the upper-outer quadrant of the left breast warranting further evaluation. No suspicious pulmonary abnormality   10/25/2015 - 12/01/2015 Radiation Therapy   adjuvant irradiation to rectal cancer   10/25/2015 - 12/01/2015 Chemotherapy   Xeloda 1500 mg twice daily, with concurrent irradiation   01/31/2016 Surgery   Robot assisted low anterior resection of rectal cancer    01/31/2016 Pathology Results   Invasive colorectal adenocarcinoma, 3.5cm, G2, LVI(-), peri-neural invasion (-), ypT3, margins negative, 17 nodes all negative    03/11/2016 - 06/14/2016 Chemotherapy   Adjuvant Xeloda 2000 mg in a.m., 1500 mg a evening, 2 weeks on and one-week off, a total 4-5 cycles.     10/18/2016 Imaging   CT CAP 10/18/16 IMPRESSION: Status post low anterior resection with radiation changes in the presacral region.  No findings suspicious for recurrent or metastatic disease.   01/31/2017 Mammogram   IMPRESSION: No mammographic evidence of malignancy. A result letter of this screening mammogram will be mailed directly to the patient.   05/23/2017 Procedure   05/23/2017 Colonoscopy - One 3 mm polyp in the transverse colon, removed with a cold snare. Resected and retrieved. - Normal colo-colonic anastomosis about 4cm from the anal verge. - The examination was otherwise normal on direct and retroflexion views.   10/31/2017 Imaging   10/31/2017 CT CAP IMPRESSION: Stable post treatment changes in perirectal and presacral region. No evidence recurrent or metastatic carcinoma.  Stable small uterine fibroids   09/01/2020 Procedure   Colonoscopy by Dr Ardis Hughs 09/01/20  IMPRESSION - Two 1 to 2 mm polyps in the ascending colon, removed with a cold biopsy forceps. Resected and retrieved. - Normal colo-colonic LAR anastomosis about 4cm from the anal verge. - The examination was otherwise normal on direct views.  -----Final Path is still pending.       CURRENT THERAPY:  Surveillance  INTERVAL HISTORY:  Karen Good is here for a follow up of rectal cancer. She was last seen by me 08/04/20. She presents to the clinic with her husband. She notes she is doing well. She notes she has been helping her husband recover from heart surgery. She has been able to lose 2 pounds. She denies any new major changes. She notes her BM is  normal. Her 09/01/20 colonoscopy showed 2 polyps.    REVIEW OF SYSTEMS:   Constitutional: Denies fevers, chills or abnormal weight loss Eyes: Denies blurriness of vision Ears, nose, mouth, throat, and face: Denies mucositis or sore throat Respiratory: Denies cough, dyspnea or wheezes Cardiovascular: Denies palpitation, chest discomfort or lower  extremity swelling Gastrointestinal:  Denies nausea, heartburn or change in bowel habits Skin: Denies abnormal skin rashes Lymphatics: Denies new lymphadenopathy or easy bruising Neurological:Denies numbness, tingling or new weaknesses Behavioral/Psych: Mood is stable, no new changes  All other systems were reviewed with the patient and are negative.  MEDICAL HISTORY:  Past Medical History:  Diagnosis Date  . Anemia    hx of   . BACK PAIN 04/13/2010  . BACK STRAIN, LUMBAR 04/13/2010  . Cancer ([place]) 2017   rectal CA  . DIABETES MELLITUS, TYPE II 06/02/2008   on meds  . History of blood transfusion   . History of chemotherapy   . History of radiation therapy   . Hx of transfusion of whole blood 09/2015   prior to colon surgery - [facility]  . HYPERLIPIDEMIA 06/02/2008   on meds  . HYPERTENSION 06/02/2008   on meds    SURGICAL HISTORY: Past Surgical History:  Procedure Laterality Date  . COLONOSCOPY Left 09/24/2015   Procedure: COLONOSCOPY;  Jimenez: Juanita Craver, MD;  Location: [facility] ENDOSCOPY;  Service: Endoscopy;  Laterality: Left;  . COLONOSCOPY  09/2015   Dr Delos Haring CA -suprep(exc)MAC-TA  . ESOPHAGOGASTRODUODENOSCOPY N/A 09/24/2015   Procedure: ESOPHAGOGASTRODUODENOSCOPY (EGD);  [place]: Juanita Craver, MD;  Location: [facility] ENDOSCOPY;  Service: Endoscopy;  Laterality: N/A;  . EUS N/A 10/05/2015   Procedure: LOWER ENDOSCOPIC ULTRASOUND (EUS);  [place]: Milus Banister, MD;  Location: [facility] ENDOSCOPY;  Service: Endoscopy;  Laterality: N/A;  . s/p breast biopsy Left 07/2005   benign  . TUBAL LIGATION      I have reviewed the social history and family history with the patient and they are unchanged from previous note.  ALLERGIES:  has No Known Allergies.  MEDICATIONS:  Current Outpatient Medications  Medication Sig Dispense Refill  . amLODipine (NORVASC) 5 MG tablet TAKE 1 TABLET BY MOUTH EVERY DAY 90 tablet 2  . atenolol (TENORMIN) 50 MG tablet TAKE 1 TABLET BY MOUTH EVERY DAY 90 tablet  3  . atorvastatin (LIPITOR) 20 MG tablet TAKE 1 TABLET BY MOUTH EVERY DAY 90 tablet 1  . Blood Glucose Monitoring Suppl (ONE TOUCH ULTRA 2) w/Device KIT Use as directed daily 1 each 0  . [employer] VITAMIN B12 1000 MCG tablet TAKE 1 TABLET BY MOUTH EVERY DAY 90 tablet 3  . glipiZIDE (GLUCOTROL XL) 5 MG 24 hr tablet TAKE 2 TABLETS BY MOUTH EVERY DAY 180 tablet 3  . glucose blood (ONE TOUCH ULTRA TEST) test strip USE AS INSTRUCTED ONCE DAILY E11.9 100 each 12  . hydrochlorothiazide (HYDRODIURIL) 25 MG tablet TAKE 1 TABLET (25 MG TOTAL) BY MOUTH DAILY. MUST KEEP SCHEDULED APPT FOR FUTURE REFILLS 90 tablet 3  . ibuprofen (ADVIL,MOTRIN) 200 MG tablet Take 400 mg by mouth every 6 (six) hours as needed for mild pain.    Marland Kitchen KLOR-CON M20 20 MEQ tablet TAKE 2 TABLETS BY MOUTH DAILY 60 tablet 0  . Lancets Misc. MISC Use as directed once daily 100 each 11  . metFORMIN (GLUCOPHAGE) 1000 MG tablet TAKE 1 TABLET (1,000 MG TOTAL) BY MOUTH 2 (TWO) TIMES DAILY WITH A MEAL. 180 tablet 3  . pioglitazone (ACTOS) 45 MG tablet TAKE  1 TABLET BY MOUTH EVERY DAY 90 tablet 2  . sitaGLIPtin (JANUVIA) 100 MG tablet Take 1 tablet (100 mg total) by mouth daily. 90 tablet 3  . telmisartan (MICARDIS) 80 MG tablet TAKE 1 TABLET BY MOUTH EVERY DAY 90 tablet 2   No current facility-administered medications for this visit.    PHYSICAL EXAMINATION: ECOG PERFORMANCE STATUS: 0 - Asymptomatic  Vitals:   09/04/20 1012  BP: (!) 153/78  Pulse: 89  Resp: 17  Temp: 97.6 F (36.4 C)  SpO2: 100%   Filed Weights   09/04/20 1012  Weight: 174 lb 3.2 oz (79 kg)    GENERAL:alert, no distress and comfortable SKIN: skin color, texture, turgor are normal, no rashes or significant lesions EYES: normal, Conjunctiva are pink and non-injected, sclera clear  NECK: supple, thyroid normal size, non-tender, without nodularity LYMPH:  no palpable lymphadenopathy in the cervical, axillary  LUNGS: clear to auscultation and percussion with normal  breathing effort HEART: regular rate & rhythm and no murmurs and no lower extremity edema ABDOMEN:abdomen soft, non-tender and normal bowel sounds Musculoskeletal:no cyanosis of digits and no clubbing  NEURO: alert & oriented x 3 with fluent speech, no focal motor/sensory deficits  LABORATORY DATA:  I have reviewed the data as listed CBC Latest Ref Rng & Units 09/04/2020 08/14/2020 08/05/2019  WBC 4.0 - 10.5 K/uL 3.5(L) 2.7(L) 5.2  Hemoglobin 12.0 - 15.0 g/dL 10.8(L) 11.4(L) 11.8(L)  Hematocrit 36.0 - 46.0 % 33.9(L) 34.9(L) 38.4  Platelets 150 - 400 K/uL 232 240.0 301     CMP Latest Ref Rng & Units 09/04/2020 08/14/2020 02/07/2020  Glucose 70 - 99 mg/dL 195(H) 119(H) 131(H)  BUN 8 - 23 mg/dL _0 Creatinine 0.44 - 1.00 mg/dL 0.73 0.58 0.63  Sodium 135 - 145 mmol/L 141 140 140  Potassium 3.5 - 5.1 mmol/L 3.3(L) 3.9 3.8  Chloride 98 - 111 mmol/L 103 102 102  CO2 22 - 32 mmol/L _1 Calcium 8.9 - 10.3 mg/dL 9.6 9.8 9.7  Total Protein 6.5 - 8.1 g/dL 6.8 7.2 6.7  Total Bilirubin 0.3 - 1.2 mg/dL 0.9 0.7 0.7  Alkaline Phos 38 - 126 U/L 61 66 69  AST 15 - 41 U/L 13(L) 16 13  ALT 0 - 44 U/L _2 RADIOGRAPHIC STUDIES: I have personally reviewed the radiological images as listed and agreed with the findings in the report. No results found.   ASSESSMENT & PLAN:  Kattie Santoyo Trejos is a 65 y.o. female with    1. Rectal adenocarcinoma, proximal rectum, uT3N0M0, stage IIA, ypT3N0M0 -She was diagnosed in 09/2015. She is s/p chemoRT, surgical resection and adjuvant Xeloda. -HersurveillanceColonoscopy with Dr Ardis Hughs from 09/01/20 showed 2 small polyps and were removed. Final path is still pending.  -She is clinically doing well. Lab reviewed, her CBC and CMP are within normal limits except WBC 3.5, Hg 10.8. Her physical exam was unremarkable. There is no clinical concern for recurrence. -I discussed monitoring her mild anemia with her PCP. She can start multivitamin in meantime.   -She is 5 years from diagnosis. Her risk of recurrence is minimal now. She will continue surveillance with her PCP and Dr Ardis Hughs. She can f/u with me as needed in the future. She is agreeable.   2. HTN and DM -Continue medications and f/u with PCP for management.   3.Cancer screening -Previous CT chest revealed a left breast mass, however her screening mammogram was negative in 09/2015.  -  Mammogram on 11/2019revealed no mammographic evidence of malignancy. I encouraged her to Continue annual screening mammogram.  4. Milld anemia  -likely anemia of chronic disease. I encourage her to take MVI, recent colonoscopy was negative for source of bleeding.  -f/u with PCP   PLAN -She has completed 5 years of cancer surveillance.   -F/u with me as needed in the future.    No problem-specific Assessment & Plan notes found for this encounter.   No orders of the defined types were placed in this encounter.  All questions were answered. The patient knows to call the clinic with any problems, questions or concerns. No barriers to learning was detected. The total time spent in the appointment was 25 minutes.     [name] Merle, MD 09/04/2020   I, Joslyn Devon, am acting as scribe for [name] Merle, MD.   I have reviewed the above documentation for accuracy and completeness, and I agree with the above.

## 2020-08-31 ENCOUNTER — Other Ambulatory Visit: Payer: Self-pay

## 2020-08-31 DIAGNOSIS — C20 Malignant neoplasm of rectum: Secondary | ICD-10-CM

## 2020-09-01 ENCOUNTER — Other Ambulatory Visit: Payer: Self-pay

## 2020-09-01 ENCOUNTER — Encounter: Payer: Self-pay | Admitting: Gastroenterology

## 2020-09-01 ENCOUNTER — Ambulatory Visit (AMBULATORY_SURGERY_CENTER): Payer: Medicare HMO | Admitting: Gastroenterology

## 2020-09-01 VITALS — BP 157/84 | HR 92 | Temp 99.8°F | Resp 19 | Ht 65.0 in | Wt 176.0 lb

## 2020-09-01 DIAGNOSIS — D122 Benign neoplasm of ascending colon: Secondary | ICD-10-CM

## 2020-09-01 DIAGNOSIS — Z86010 Personal history of colonic polyps: Secondary | ICD-10-CM

## 2020-09-01 DIAGNOSIS — Z85038 Personal history of other malignant neoplasm of large intestine: Secondary | ICD-10-CM | POA: Diagnosis not present

## 2020-09-01 MED ORDER — SODIUM CHLORIDE 0.9 % IV SOLN: 500.0000 mL | Freq: Once | INTRAVENOUS | Status: DC

## 2020-09-01 NOTE — Progress Notes (Signed)
PT taken to PACU. Monitors in place. VSS. Report given to RN. 

## 2020-09-01 NOTE — Patient Instructions (Signed)
Handout given:  Polyps Resume previous diet Continue current medications Await pathology results  YOU HAD AN ENDOSCOPIC PROCEDURE TODAY AT THE [facility]:   Refer to the procedure report that was given to you for any specific questions about what was found during the examination.  If the procedure report does not answer your questions, please call your gastroenterologist to clarify.  If you requested that your care partner not be given the details of your procedure findings, then the procedure report has been included in a sealed envelope for you to review at your convenience later.  YOU SHOULD EXPECT: Some feelings of bloating in the abdomen. Passage of more gas than usual.  Walking can help get rid of the air that was put into your GI tract during the procedure and reduce the bloating. If you had a lower endoscopy (such as a colonoscopy or flexible sigmoidoscopy) you may notice spotting of blood in your stool or on the toilet paper. If you underwent a bowel prep for your procedure, you may not have a normal bowel movement for a few days.  Please Note:  You might notice some irritation and congestion in your nose or some drainage.  This is from the oxygen used during your procedure.  There is no need for concern and it should clear up in a day or so.  SYMPTOMS TO REPORT IMMEDIATELY:   Following lower endoscopy (colonoscopy or flexible sigmoidoscopy):  Excessive amounts of blood in the stool  Significant tenderness or worsening of abdominal pains  Swelling of the abdomen that is new, acute  Fever of 100F or higher  For urgent or emergent issues, a gastroenterologist can be reached at any hour by calling [phone]. Do not use MyChart messaging for urgent concerns.    DIET:  We do recommend a small meal at first, but then you may proceed to your regular diet.  Drink plenty of fluids but you should avoid alcoholic beverages for 24 hours.  ACTIVITY:  You should plan to take  it easy for the rest of today and you should NOT DRIVE or use heavy machinery until tomorrow (because of the sedation medicines used during the test).    FOLLOW UP: Our staff will call the number listed on your records 48-72 hours following your procedure to check on you and address any questions or concerns that you may have regarding the information given to you following your procedure. If we do not reach you, we will leave a message.  We will attempt to reach you two times.  During this call, we will ask if you have developed any symptoms of COVID 19. If you develop any symptoms (ie: fever, flu-like symptoms, shortness of breath, cough etc.) before then, please call [phone].  If you test positive for Covid 19 in the 2 weeks post procedure, please call and report this information to us.    If any biopsies were taken you will be contacted by phone or by letter within the next 1-3 weeks.  Please call us at [phone] if you have not heard about the biopsies in 3 weeks.   SIGNATURES/CONFIDENTIALITY: You and/or your care partner have signed paperwork which will be entered into your electronic medical record.  These signatures attest to the fact that that the information above on your After Visit Summary has been reviewed and is understood.  Full responsibility of the confidentiality of this discharge information lies with you and/or your care-partner. 

## 2020-09-01 NOTE — Progress Notes (Signed)
Medical history reviewed with no changes since PV. VS assessed by [facility]

## 2020-09-01 NOTE — Op Note (Signed)
Karen Good: Karen Good Procedure Date: 09/01/2020 1:24 PM MRN: [id number] Endoscopist: Milus Banister , MD Age: 65 Referring MD:  Date of Birth: 1956-04-11 Gender: Female Account #: [id number] Procedure:                Colonoscopy Indications:              High risk colon cancer surveillance: Personal                            history of colon cancer; T3N0M0 rectal                            adenocarcinoma diagnosed 2017, s/p neoadjuvant                            chemo/XRT, LAR, adjuvant chemo; Colonoscopy 2019                            single subCM adenoma, normal LAR anastomosis about                            4cm from anal verge Medicines:                Monitored Anesthesia Care Procedure:                Pre-Anesthesia Assessment:                           - Prior to the procedure, a History and Physical                            was performed, and patient medications and                            allergies were reviewed. The patient's tolerance of                            previous anesthesia was also reviewed. The risks                            and benefits of the procedure and the sedation                            options and risks were discussed with the patient.                            All questions were answered, and informed consent                            was obtained. Prior Anticoagulants: The patient has                            taken no previous anticoagulant or antiplatelet  agents. ASA Grade Assessment: II - A patient with                            mild systemic disease. After reviewing the risks                            and benefits, the patient was deemed in                            satisfactory condition to undergo the procedure.                           After obtaining informed consent, the colonoscope                            was passed under direct vision. Throughout the                             procedure, the patient's blood pressure, pulse, and                            oxygen saturations were monitored continuously. The                            Olympus CF-HQ190L [id number]) Colonoscope was                            introduced through the anus and advanced to the the                            cecum, identified by appendiceal orifice and                            ileocecal valve. The colonoscopy was performed                            without difficulty. The patient tolerated the                            procedure well. The quality of the bowel                            preparation was good. The ileocecal valve,                            appendiceal orifice, and rectum were photographed. Scope In: 1:28:10 PM Scope Out: 1:39:02 PM Scope Withdrawal Time: 0 hours 8 minutes 35 seconds  Total Procedure Duration: 0 hours 10 minutes 52 seconds  Findings:                 Two sessile polyps were found in the ascending                            colon. The polyps were 1 to 2 mm in  size. These                            polyps were removed with a cold biopsy forceps.                            Resection and retrieval were complete.                           Normal colo-colonic LAR anastomosis about 4cm from                            the anal verge.                           The exam was otherwise without abnormality on                            direct views. Complications:            No immediate complications. Estimated blood loss:                            None. Impression:               - Two 1 to 2 mm polyps in the ascending colon,                            removed with a cold biopsy forceps. Resected and                            retrieved.                           - Normal colo-colonic LAR anastomosis about 4cm                            from the anal verge.                           - The examination was otherwise normal on direct                             views. Recommendation:           - Patient has a contact number available for                            emergencies. The signs and symptoms of potential                            delayed complications were discussed with the                            patient. Return to normal activities tomorrow.  Written discharge instructions were provided to the                            patient.                           - Resume previous diet.                           - Continue present medications.                           - Await pathology results. Milus Banister, MD 09/01/2020 1:43:02 PM This report has been signed electronically.

## 2020-09-01 NOTE — Progress Notes (Signed)
Called to room to assist during endoscopic procedure.  Patient ID and intended procedure confirmed with present staff. Received instructions for my participation in the procedure from the performing physician.  

## 2020-09-04 ENCOUNTER — Telehealth: Payer: Self-pay

## 2020-09-04 ENCOUNTER — Inpatient Hospital Stay: Payer: Medicare HMO | Admitting: Hematology

## 2020-09-04 ENCOUNTER — Inpatient Hospital Stay: Payer: Medicare HMO | Attending: Hematology

## 2020-09-04 ENCOUNTER — Other Ambulatory Visit: Payer: Self-pay

## 2020-09-04 ENCOUNTER — Encounter: Payer: Self-pay | Admitting: Hematology

## 2020-09-04 VITALS — BP 153/78 | HR 89 | Temp 97.6°F | Resp 17 | Ht 65.0 in | Wt 174.2 lb

## 2020-09-04 DIAGNOSIS — Z9221 Personal history of antineoplastic chemotherapy: Secondary | ICD-10-CM | POA: Diagnosis not present

## 2020-09-04 DIAGNOSIS — C20 Malignant neoplasm of rectum: Secondary | ICD-10-CM | POA: Diagnosis not present

## 2020-09-04 DIAGNOSIS — Z923 Personal history of irradiation: Secondary | ICD-10-CM | POA: Insufficient documentation

## 2020-09-04 DIAGNOSIS — D649 Anemia, unspecified: Secondary | ICD-10-CM | POA: Diagnosis not present

## 2020-09-04 DIAGNOSIS — Z7984 Long term (current) use of oral hypoglycemic drugs: Secondary | ICD-10-CM | POA: Insufficient documentation

## 2020-09-04 DIAGNOSIS — E119 Type 2 diabetes mellitus without complications: Secondary | ICD-10-CM | POA: Insufficient documentation

## 2020-09-04 DIAGNOSIS — I10 Essential (primary) hypertension: Secondary | ICD-10-CM | POA: Insufficient documentation

## 2020-09-04 LAB — CMP (CANCER CENTER ONLY)
ALT: 10 U/L (ref 0–44)
AST: 13 U/L — ABNORMAL LOW (ref 15–41)
Albumin: 3.8 g/dL (ref 3.5–5.0)
Alkaline Phosphatase: 61 U/L (ref 38–126)
Anion gap: 9 (ref 5–15)
BUN: 18 mg/dL (ref 8–23)
CO2: 29 mmol/L (ref 22–32)
Calcium: 9.6 mg/dL (ref 8.9–10.3)
Chloride: 103 mmol/L (ref 98–111)
Creatinine: 0.73 mg/dL (ref 0.44–1.00)
GFR, Estimated: >60 mL/min (ref >60)
Glucose, Bld: 195 mg/dL — ABNORMAL HIGH (ref 70–99)
Potassium: 3.3 mmol/L — ABNORMAL LOW (ref 3.5–5.1)
Sodium: 141 mmol/L (ref 135–145)
Total Bilirubin: 0.9 mg/dL (ref 0.3–1.2)
Total Protein: 6.8 g/dL (ref 6.5–8.1)

## 2020-09-04 LAB — CBC WITH DIFFERENTIAL (CANCER CENTER ONLY)
Abs Immature Granulocytes: 0.02 10*3/uL (ref 0.00–0.07)
Basophils Absolute: 0 10*3/uL (ref 0.0–0.1)
Basophils Relative: 1 %
Eosinophils Absolute: 0.1 10*3/uL (ref 0.0–0.5)
Eosinophils Relative: 3 %
HCT: 33.9 % — ABNORMAL LOW (ref 36.0–46.0)
Hemoglobin: 10.8 g/dL — ABNORMAL LOW (ref 12.0–15.0)
Immature Granulocytes: 1 %
Lymphocytes Relative: 21 %
Lymphs Abs: 0.8 10*3/uL (ref 0.7–4.0)
MCH: 27.2 pg (ref 26.0–34.0)
MCHC: 31.9 g/dL (ref 30.0–36.0)
MCV: 85.4 fL (ref 80.0–100.0)
Monocytes Absolute: 0.3 10*3/uL (ref 0.1–1.0)
Monocytes Relative: 9 %
Neutro Abs: 2.3 10*3/uL (ref 1.7–7.7)
Neutrophils Relative %: 65 %
Platelet Count: 232 10*3/uL (ref 150–400)
RBC: 3.97 MIL/uL (ref 3.87–5.11)
RDW: 14.6 % (ref 11.5–15.5)
WBC Count: 3.5 10*3/uL — ABNORMAL LOW (ref 4.0–10.5)
nRBC: 0 % (ref 0.0–0.2)

## 2020-09-04 NOTE — Telephone Encounter (Signed)
Patient called and made aware that her Potassium level was a little low.  She is to take 3 Potassium tablets by mouth per day for the next three days and then return to current regimen.  Patient acknowledged understanding and knows to call clinic if there are any questions or concerns.

## 2020-09-04 NOTE — Telephone Encounter (Signed)
Patient called and made aware that her Potassium level was a little low.  She is to take 3 Potassium tablets by mouth per day for the next three days and then return to current regimen.  Patient acknowledged understanding and knows to call clinic if there are any questions or concerns.   

## 2020-09-05 ENCOUNTER — Telehealth: Payer: Self-pay | Admitting: *Deleted

## 2020-09-05 NOTE — Telephone Encounter (Signed)
  Follow up Call-  Call back number 09/01/2020  Post procedure Call Back phone  # [phone]  Permission to leave phone message Yes  Some recent data might be hidden     Patient questions:  Do you have a fever, pain , or abdominal swelling? No. Pain Score  0 *  Have you tolerated food without any problems? Yes.    Have you been able to return to your normal activities? Yes.    Do you have any questions about your discharge instructions: Diet   No. Medications  No. Follow up visit  No.  Do you have questions or concerns about your Care? No.  Actions: * If pain score is 4 or above: 1. No action needed, pain <4.Have you developed a fever since your procedure? no  2.   Have you had an respiratory symptoms (SOB or cough) since your procedure? no  3.   Have you tested positive for COVID 19 since your procedure no  4.   Have you had any family members/close contacts diagnosed with the COVID 19 since your procedure?  no   If yes to any of these questions please route to Joylene John, RN and Joella Prince, RN

## 2020-09-08 ENCOUNTER — Other Ambulatory Visit: Payer: Self-pay | Admitting: Internal Medicine

## 2020-09-09 NOTE — Telephone Encounter (Signed)
Please refill as per office routine med refill policy (all routine meds refilled for 3 mo or monthly per pt preference up to one year from last visit, then month to month grace period for 3 mo, then further med refills will have to be denied)  

## 2020-09-13 ENCOUNTER — Encounter: Payer: Self-pay | Admitting: Gastroenterology

## 2020-09-14 ENCOUNTER — Other Ambulatory Visit: Payer: Self-pay | Admitting: Internal Medicine

## 2020-09-14 DIAGNOSIS — E876 Hypokalemia: Secondary | ICD-10-CM

## 2020-09-14 DIAGNOSIS — C20 Malignant neoplasm of rectum: Secondary | ICD-10-CM

## 2020-09-14 NOTE — Telephone Encounter (Signed)
Please refill as per office routine med refill policy (all routine meds refilled for 3 mo or monthly per pt preference up to one year from last visit, then month to month grace period for 3 mo, then further med refills will have to be denied)  

## 2020-09-28 ENCOUNTER — Other Ambulatory Visit: Payer: Self-pay | Admitting: Internal Medicine

## 2020-09-28 DIAGNOSIS — E876 Hypokalemia: Secondary | ICD-10-CM

## 2020-09-28 DIAGNOSIS — C20 Malignant neoplasm of rectum: Secondary | ICD-10-CM

## 2020-12-19 ENCOUNTER — Other Ambulatory Visit: Payer: Self-pay | Admitting: Internal Medicine

## 2020-12-19 NOTE — Telephone Encounter (Signed)
Please refill as per office routine med refill policy (all routine meds to be refilled for 3 mo or monthly (per pt preference) up to one year from last visit, then month to month grace period for 3 mo, then further med refills will have to be denied) ? ?

## 2021-01-09 ENCOUNTER — Other Ambulatory Visit: Payer: Self-pay

## 2021-01-09 NOTE — Telephone Encounter (Signed)
1.Medication Requested: Januiva  2. Pharmacy (Name, [address], [place]): [facility] [phone] (Fax)  3. On Med List: yes   4. Last Visit with PCP: 08/14/2020   Agent: Please be advised that RX refills may take up to 3 business days. We ask that you follow-up with your pharmacy.

## 2021-01-10 ENCOUNTER — Other Ambulatory Visit: Payer: Self-pay | Admitting: Internal Medicine

## 2021-01-10 MED ORDER — SITAGLIPTIN PHOSPHATE 100 MG PO TABS: 100.0000 mg | ORAL_TABLET | Freq: Every day | ORAL | 3 refills | Status: DC

## 2021-01-10 NOTE — Telephone Encounter (Signed)
Ok done hardcopy to [employer] station

## 2021-01-10 NOTE — Telephone Encounter (Signed)
Patient calling to follow up on the status of a new script for [place] Karen Good being faxed to [facility] [phone]

## 2021-01-11 NOTE — Telephone Encounter (Signed)
Prescription faxed and patient notified.

## 2021-03-13 ENCOUNTER — Other Ambulatory Visit: Payer: Self-pay | Admitting: Internal Medicine

## 2021-03-13 NOTE — Telephone Encounter (Signed)
Please refill as per office routine med refill policy (all routine meds to be refilled for 3 mo or monthly (per pt preference) up to one year from last visit, then month to month grace period for 3 mo, then further med refills will have to be denied) ? ?

## 2021-03-20 ENCOUNTER — Telehealth: Payer: Self-pay | Admitting: Internal Medicine

## 2021-03-20 NOTE — Telephone Encounter (Signed)
Form received and given to provider for signature

## 2021-03-20 NOTE — Telephone Encounter (Signed)
Type of form received (Home Health, FMLA, disability, handicapped placard, Surgical clearance) Patient Assistance Form  Form placed in (E-fax folder, Provider mailbox) Provider mailbox  Additional instructions from the patient (mail, fax, notify by phone when complete) Notify by phone when complete  Things to remember: [place] office: If form received in person, remind patient that forms take 7-10 business days CMA should attach charge sheet and put on Supervisor's desk

## 2021-03-21 ENCOUNTER — Other Ambulatory Visit: Payer: Self-pay | Admitting: Internal Medicine

## 2021-03-21 MED ORDER — SITAGLIPTIN PHOSPHATE 100 MG PO TABS: 100.0000 mg | ORAL_TABLET | Freq: Every day | ORAL | 3 refills | Status: DC

## 2021-03-23 NOTE — Telephone Encounter (Signed)
Patient notified that paperwork is ready for pick up

## 2021-04-27 ENCOUNTER — Other Ambulatory Visit: Payer: Self-pay | Admitting: Internal Medicine

## 2021-04-27 DIAGNOSIS — E876 Hypokalemia: Secondary | ICD-10-CM

## 2021-04-27 DIAGNOSIS — C20 Malignant neoplasm of rectum: Secondary | ICD-10-CM

## 2021-04-27 NOTE — Telephone Encounter (Signed)
Please refill as per office routine med refill policy (all routine meds to be refilled for 3 mo or monthly (per pt preference) up to one year from last visit, then month to month grace period for 3 mo, then further med refills will have to be denied) ? ?

## 2021-05-08 ENCOUNTER — Other Ambulatory Visit: Payer: Self-pay | Admitting: Internal Medicine

## 2021-05-08 NOTE — Telephone Encounter (Signed)
Please refill as per office routine med refill policy (all routine meds to be refilled for 3 mo or monthly (per pt preference) up to one year from last visit, then month to month grace period for 3 mo, then further med refills will have to be denied) ? ?

## 2021-05-30 ENCOUNTER — Other Ambulatory Visit: Payer: Self-pay | Admitting: Internal Medicine

## 2021-05-30 DIAGNOSIS — C20 Malignant neoplasm of rectum: Secondary | ICD-10-CM

## 2021-05-30 DIAGNOSIS — E876 Hypokalemia: Secondary | ICD-10-CM

## 2021-05-30 NOTE — Telephone Encounter (Signed)
Please refill as per office routine med refill policy (all routine meds to be refilled for 3 mo or monthly (per pt preference) up to one year from last visit, then month to month grace period for 3 mo, then further med refills will have to be denied) ? ?

## 2021-06-05 ENCOUNTER — Other Ambulatory Visit: Payer: Self-pay | Admitting: Internal Medicine

## 2021-06-05 NOTE — Telephone Encounter (Signed)
Please refill as per office routine med refill policy (all routine meds to be refilled for 3 mo or monthly (per pt preference) up to one year from last visit, then month to month grace period for 3 mo, then further med refills will have to be denied) ? ?

## 2021-06-07 ENCOUNTER — Other Ambulatory Visit: Payer: Self-pay | Admitting: Internal Medicine

## 2021-06-07 NOTE — Telephone Encounter (Signed)
Please refill as per office routine med refill policy (all routine meds to be refilled for 3 mo or monthly (per pt preference) up to one year from last visit, then month to month grace period for 3 mo, then further med refills will have to be denied) ? ?

## 2021-06-17 ENCOUNTER — Other Ambulatory Visit: Payer: Self-pay | Admitting: Internal Medicine

## 2021-06-17 NOTE — Telephone Encounter (Signed)
Please refill as per office routine med refill policy (all routine meds to be refilled for 3 mo or monthly (per pt preference) up to one year from last visit, then month to month grace period for 3 mo, then further med refills will have to be denied) ? ?

## 2021-06-28 ENCOUNTER — Other Ambulatory Visit: Payer: Self-pay | Admitting: Internal Medicine

## 2021-06-28 NOTE — Telephone Encounter (Signed)
Please refill as per office routine med refill policy (all routine meds to be refilled for 3 mo or monthly (per pt preference) up to one year from last visit, then month to month grace period for 3 mo, then further med refills will have to be denied) ? ?

## 2021-07-12 ENCOUNTER — Other Ambulatory Visit: Payer: Self-pay | Admitting: Internal Medicine

## 2021-07-12 DIAGNOSIS — E876 Hypokalemia: Secondary | ICD-10-CM

## 2021-07-12 DIAGNOSIS — C20 Malignant neoplasm of rectum: Secondary | ICD-10-CM

## 2021-08-24 ENCOUNTER — Other Ambulatory Visit: Payer: Self-pay | Admitting: Internal Medicine

## 2021-08-24 NOTE — Telephone Encounter (Signed)
Please refill as per office routine med refill policy (all routine meds to be refilled for 3 mo or monthly (per pt preference) up to one year from last visit, then month to month grace period for 3 mo, then further med refills will have to be denied) ? ?

## 2021-08-27 NOTE — Telephone Encounter (Signed)
Refills denied, patient needs physical and states that she will call back to schedule ?

## 2021-08-31 ENCOUNTER — Other Ambulatory Visit: Payer: Self-pay | Admitting: Internal Medicine

## 2021-08-31 NOTE — Telephone Encounter (Signed)
Please refill as per office routine med refill policy (all routine meds to be refilled for 3 mo or monthly (per pt preference) up to one year from last visit, then month to month grace period for 3 mo, then further med refills will have to be denied) ? ?

## 2021-09-08 ENCOUNTER — Other Ambulatory Visit: Payer: Self-pay | Admitting: Internal Medicine

## 2021-09-08 NOTE — Telephone Encounter (Signed)
Please refill as per office routine med refill policy (all routine meds to be refilled for 3 mo or monthly (per pt preference) up to one year from last visit, then month to month grace period for 3 mo, then further med refills will have to be denied) ? ?

## 2021-09-15 ENCOUNTER — Other Ambulatory Visit: Payer: Self-pay | Admitting: Internal Medicine

## 2021-09-15 DIAGNOSIS — E876 Hypokalemia: Secondary | ICD-10-CM

## 2021-09-15 DIAGNOSIS — C20 Malignant neoplasm of rectum: Secondary | ICD-10-CM

## 2021-09-15 NOTE — Telephone Encounter (Signed)
Please refill as per office routine med refill policy (all routine meds to be refilled for 3 mo or monthly (per pt preference) up to one year from last visit, then month to month grace period for 3 mo, then further med refills will have to be denied) ? ?

## 2021-09-19 ENCOUNTER — Ambulatory Visit (INDEPENDENT_AMBULATORY_CARE_PROVIDER_SITE_OTHER): Payer: Medicare HMO | Admitting: Internal Medicine

## 2021-09-19 ENCOUNTER — Other Ambulatory Visit: Payer: Self-pay | Admitting: Internal Medicine

## 2021-09-19 ENCOUNTER — Encounter: Payer: Self-pay | Admitting: Internal Medicine

## 2021-09-19 VITALS — BP 142/76 | HR 78 | Temp 98.2°F | Ht 65.0 in | Wt 178.0 lb

## 2021-09-19 DIAGNOSIS — E7800 Pure hypercholesterolemia, unspecified: Secondary | ICD-10-CM

## 2021-09-19 DIAGNOSIS — E1165 Type 2 diabetes mellitus with hyperglycemia: Secondary | ICD-10-CM

## 2021-09-19 DIAGNOSIS — Z23 Encounter for immunization: Secondary | ICD-10-CM

## 2021-09-19 DIAGNOSIS — E559 Vitamin D deficiency, unspecified: Secondary | ICD-10-CM | POA: Diagnosis not present

## 2021-09-19 DIAGNOSIS — I10 Essential (primary) hypertension: Secondary | ICD-10-CM | POA: Diagnosis not present

## 2021-09-19 DIAGNOSIS — Z0001 Encounter for general adult medical examination with abnormal findings: Secondary | ICD-10-CM

## 2021-09-19 DIAGNOSIS — E538 Deficiency of other specified B group vitamins: Secondary | ICD-10-CM | POA: Diagnosis not present

## 2021-09-19 DIAGNOSIS — E876 Hypokalemia: Secondary | ICD-10-CM

## 2021-09-19 LAB — HEPATIC FUNCTION PANEL
ALT: 11 U/L (ref 0–35)
AST: 13 U/L (ref 0–37)
Albumin: 4.5 g/dL (ref 3.5–5.2)
Alkaline Phosphatase: 61 U/L (ref 39–117)
Bilirubin, Direct: 0.2 mg/dL (ref 0.0–0.3)
Total Bilirubin: 1 mg/dL (ref 0.2–1.2)
Total Protein: 7.4 g/dL (ref 6.0–8.3)

## 2021-09-19 LAB — CBC WITH DIFFERENTIAL/PLATELET
Basophils Absolute: 0 10*3/uL (ref 0.0–0.1)
Basophils Relative: 0.8 % (ref 0.0–3.0)
Eosinophils Absolute: 0.1 10*3/uL (ref 0.0–0.7)
Eosinophils Relative: 2.9 % (ref 0.0–5.0)
HCT: 36.7 % (ref 36.0–46.0)
Hemoglobin: 11.8 g/dL — ABNORMAL LOW (ref 12.0–15.0)
Lymphocytes Relative: 19.4 % (ref 12.0–46.0)
Lymphs Abs: 0.7 10*3/uL (ref 0.7–4.0)
MCHC: 32 g/dL (ref 30.0–36.0)
MCV: 84.7 fl (ref 78.0–100.0)
Monocytes Absolute: 0.3 10*3/uL (ref 0.1–1.0)
Monocytes Relative: 7.1 % (ref 3.0–12.0)
Neutro Abs: 2.5 10*3/uL (ref 1.4–7.7)
Neutrophils Relative %: 69.8 % (ref 43.0–77.0)
Platelets: 251 10*3/uL (ref 150.0–400.0)
RBC: 4.34 Mil/uL (ref 3.87–5.11)
RDW: 14.6 % (ref 11.5–15.5)
WBC: 3.6 10*3/uL — ABNORMAL LOW (ref 4.0–10.5)

## 2021-09-19 LAB — BASIC METABOLIC PANEL
BUN: 15 mg/dL (ref 6–23)
CO2: 27 mEq/L (ref 19–32)
Calcium: 10 mg/dL (ref 8.4–10.5)
Chloride: 102 mEq/L (ref 96–112)
Creatinine, Ser: 0.56 mg/dL (ref 0.40–1.20)
GFR: 94.08 mL/min (ref >60.00)
Glucose, Bld: 126 mg/dL — ABNORMAL HIGH (ref 70–99)
Potassium: 4.1 mEq/L (ref 3.5–5.1)
Sodium: 140 mEq/L (ref 135–145)

## 2021-09-19 LAB — LIPID PANEL
Cholesterol: 176 mg/dL (ref 0–200)
HDL: 56.5 mg/dL (ref >39.00)
LDL Cholesterol: 98 mg/dL (ref 0–99)
NonHDL: 119.9
Total CHOL/HDL Ratio: 3
Triglycerides: 108 mg/dL (ref 0.0–149.0)
VLDL: 21.6 mg/dL (ref 0.0–40.0)

## 2021-09-19 LAB — VITAMIN B12: Vitamin B-12: >1504 pg/mL — ABNORMAL HIGH (ref 211–911)

## 2021-09-19 LAB — TSH: TSH: 1.61 u[IU]/mL (ref 0.35–5.50)

## 2021-09-19 LAB — MICROALBUMIN / CREATININE URINE RATIO
Creatinine,U: 29.1 mg/dL
Microalb Creat Ratio: 17.7 mg/g (ref 0.0–30.0)
Microalb, Ur: 5.2 mg/dL — ABNORMAL HIGH (ref 0.0–1.9)

## 2021-09-19 LAB — HEMOGLOBIN A1C: Hgb A1c MFr Bld: 7.8 % — ABNORMAL HIGH (ref 4.6–6.5)

## 2021-09-19 LAB — VITAMIN D 25 HYDROXY (VIT D DEFICIENCY, FRACTURES): VITD: 45.86 ng/mL (ref 30.00–100.00)

## 2021-09-19 MED ORDER — TELMISARTAN 80 MG PO TABS: 80.0000 mg | ORAL_TABLET | Freq: Every day | ORAL | 3 refills | Status: DC

## 2021-09-19 MED ORDER — AMLODIPINE BESYLATE 10 MG PO TABS: 10.0000 mg | ORAL_TABLET | Freq: Every day | ORAL | 3 refills | Status: DC

## 2021-09-19 MED ORDER — ATENOLOL 50 MG PO TABS
50.0000 mg | ORAL_TABLET | Freq: Every day | ORAL | 3 refills | Status: DC
Start: 2021-09-19 — End: 2022-10-03

## 2021-09-19 MED ORDER — POTASSIUM CHLORIDE CRYS ER 20 MEQ PO TBCR: 40.0000 meq | EXTENDED_RELEASE_TABLET | Freq: Every day | ORAL | 3 refills | Status: DC

## 2021-09-19 MED ORDER — PIOGLITAZONE HCL 45 MG PO TABS
45.0000 mg | ORAL_TABLET | Freq: Every day | ORAL | 3 refills | Status: DC
Start: 2021-09-19 — End: 2022-10-03

## 2021-09-19 MED ORDER — SITAGLIPTIN PHOSPHATE 100 MG PO TABS: 100.0000 mg | ORAL_TABLET | Freq: Every day | ORAL | 3 refills | Status: DC

## 2021-09-19 MED ORDER — ATORVASTATIN CALCIUM 20 MG PO TABS: 20.0000 mg | ORAL_TABLET | Freq: Every day | ORAL | 3 refills | Status: DC

## 2021-09-19 MED ORDER — CYANOCOBALAMIN 1000 MCG PO TABS: 1000.0000 ug | ORAL_TABLET | Freq: Every day | ORAL | 3 refills | Status: DC

## 2021-09-19 MED ORDER — HYDROCHLOROTHIAZIDE 25 MG PO TABS
25.0000 mg | ORAL_TABLET | Freq: Every day | ORAL | 3 refills | Status: DC
Start: 2021-09-19 — End: 2022-10-03

## 2021-09-19 MED ORDER — METFORMIN HCL 1000 MG PO TABS
ORAL_TABLET | ORAL | 3 refills | Status: DC
Start: 2021-09-19 — End: 2022-10-03

## 2021-09-19 MED ORDER — GLIPIZIDE ER 5 MG PO TB24
10.0000 mg | ORAL_TABLET | Freq: Every day | ORAL | 3 refills | Status: DC
Start: 2021-09-19 — End: 2022-10-03

## 2021-09-19 NOTE — Patient Instructions (Signed)
You had the Pneumovax pneumonia shot today  Ok to increase the amlodipine to 10 mg per day  Please continue all other medications as before, and refills have been done if requested.  Please have the pharmacy call with any other refills you may need.  Please continue your efforts at being more active, low cholesterol diet, and weight control.  You are otherwise up to date with prevention measures today.  Please keep your appointments with your specialists as you may have planned  Please go to the LAB at the blood drawing area for the tests to be done  You will be contacted by phone if any changes need to be made immediately.  Otherwise, you will receive a letter about your results with an explanation, but please check with MyChart first.  Please remember to sign up for MyChart if you have not done so, as this will be important to you in the future with finding out test results, communicating by private email, and scheduling acute appointments online when needed.  Please make an Appointment to return in 6 months, or sooner if needed

## 2021-09-19 NOTE — Progress Notes (Signed)
Patient ID: Karen Good, female   DOB: 03-Jan-1956, 66 y.o.   MRN: [id number]         Chief Complaint:: wellness exam and dm, htn, hld,        HPI:  Karen Good is a 66 y.o. female here for wellness exam; due for pneumovax, declines shingrix, tdap, also declines mamogram, dxa, eye exam, o/w up to date                        Also Pt denies chest pain, increased sob or doe, wheezing, orthopnea, PND, [name], palpitations, dizziness or syncope.   Pt denies polydipsia, polyuria, [name], wt loss, night sweats, loss of appetite, or other constitutional symptoms  No other new complaints.  Soemtimes misses doses of meds.   Wt Readings from Last 3 Encounters:  09/19/21 178 lb (80.7 kg)  09/04/20 174 lb 3.2 oz (79 kg)  09/01/20 176 lb (79.8 kg)   BP Readings from Last 3 Encounters:  09/19/21 (!) 142/76  09/04/20 (!) 153/78  09/01/20 (!) 157/84   Immunization History  Administered Date(s) Administered   Influenza Whole 01/14/2008   Influenza, High Dose Seasonal PF 01/11/2019   Influenza-Unspecified 01/13/2017, 01/06/2018, 01/17/2020   PFIZER Comirnaty(Gray Top)Covid-19 Tri-Sucrose Vaccine 08/12/2020   PFIZER(Purple Top)SARS-COV-2 Vaccination 06/08/2019, 06/29/2019, 01/17/2020, 08/12/2020, 03/10/2021   Pneumococcal Conjugate-13 01/29/2013   Pneumococcal Polysaccharide-23 06/02/2008, 01/18/2011, 09/19/2021   Td 06/02/2008   There are no preventive care reminders to display for this patient.     Past Medical History:  Diagnosis Date   Anemia    hx of    BACK PAIN 04/13/2010   BACK STRAIN, LUMBAR 04/13/2010   Cancer ([place]) 2017   rectal CA   DIABETES MELLITUS, TYPE II 06/02/2008   on meds   History of blood transfusion    History of chemotherapy    History of radiation therapy    Hx of transfusion of whole blood 09/2015   prior to colon surgery - [facility]   HYPERLIPIDEMIA 06/02/2008   on meds   HYPERTENSION 06/02/2008   on meds    Past Surgical History:  Procedure Laterality Date   COLONOSCOPY Left 09/24/2015   Procedure: COLONOSCOPY;  Fildes: Juanita Craver, MD;  Location: [facility] ENDOSCOPY;  Service: Endoscopy;  Laterality: Left;   COLONOSCOPY  09/2015   Dr Delos Haring [place] -suprep(exc)MAC-TA   ESOPHAGOGASTRODUODENOSCOPY N/A 09/24/2015   Procedure: ESOPHAGOGASTRODUODENOSCOPY (EGD);  Doane: Juanita Craver, MD;  Location: [facility] ENDOSCOPY;  Service: Endoscopy;  Laterality: N/A;   EUS N/A 10/05/2015   Procedure: LOWER ENDOSCOPIC ULTRASOUND (EUS);  Maden: Milus Banister, MD;  Location: [facility] ENDOSCOPY;  Service: Endoscopy;  Laterality: N/A;   s/p breast biopsy Left 07/2005   benign   TUBAL LIGATION      reports that she has never smoked. She has never used smokeless tobacco. She reports current alcohol use. She reports that she does not use drugs. family history includes Diabetes in her brother, father, mother, sister, and another family member; Heart disease in her father; Hypertension in her father. No Known Allergies Current Outpatient Medications on File Prior to Visit  Medication Sig Dispense Refill   Blood Glucose Monitoring Suppl (ONE TOUCH ULTRA 2) w/Device KIT Use as directed daily 1 each 0   glucose blood (ONE TOUCH ULTRA TEST) test strip USE AS INSTRUCTED ONCE DAILY E11.9 100 each 12   ibuprofen (ADVIL,MOTRIN) 200 MG  tablet Take 400 mg by mouth every 6 (six) hours as needed for mild pain.     Lancets Misc. MISC Use as directed once daily 100 each 11   [DISCONTINUED] nebivolol (BYSTOLIC) 10 MG tablet Take 1 tablet (10 mg total) by mouth daily. 90 tablet 3   [DISCONTINUED] valsartan (DIOVAN) 320 MG tablet Take 1 tablet (320 mg total) by mouth daily. 90 tablet 3   No current facility-administered medications on file prior to visit.        ROS:  All others reviewed and negative.  Objective        PE:  BP (!) 142/76 (BP Location: Left Arm, Patient Position: Sitting, Cuff Size: Large)   Pulse 78   Temp 98.2 F (36.8  C) (Oral)   Ht _0  (1.651 m)   Wt 178 lb (80.7 kg)   SpO2 99%   BMI 29.62 kg/m                 Constitutional: Pt appears in NAD               HENT: Head: NCAT.                Right Ear: External ear normal.                 Left Ear: External ear normal.                Eyes: . Pupils are equal, round, and reactive to light. Conjunctivae and EOM are normal               Nose: without d/c or deformity               Neck: Neck supple. Gross normal ROM               Cardiovascular: Normal rate and regular rhythm.                 Pulmonary/Chest: Effort normal and breath sounds without rales or wheezing.                Abd:  Soft, NT, ND, + BS, no organomegaly               Neurological: Pt is alert. At baseline orientation, motor grossly intact               Skin: Skin is warm. No rashes, no other new lesions, LE edema - none               Psychiatric: Pt behavior is normal without agitation   Micro: none  Cardiac tracings I have personally interpreted today:  none  Pertinent Radiological findings (summarize): none   Lab Results  Component Value Date   WBC 3.6 (L) 09/19/2021   HGB 11.8 (L) 09/19/2021   HCT 36.7 09/19/2021   PLT 251.0 09/19/2021   GLUCOSE 126 (H) 09/19/2021   CHOL 176 09/19/2021   TRIG 108.0 09/19/2021   HDL 56.50 09/19/2021   LDLCALC 98 09/19/2021   ALT 11 09/19/2021   AST 13 09/19/2021   NA 140 09/19/2021   K 4.1 09/19/2021   CL 102 09/19/2021   CREATININE 0.56 09/19/2021   BUN 15 09/19/2021   CO2 27 09/19/2021   TSH 1.61 09/19/2021   HGBA1C 7.8 (H) 09/19/2021   MICROALBUR 5.2 (H) 09/19/2021   Assessment/Plan:  Karen Good is a 66 y.o. Other or two or more races [6] female with  has a past medical history of Anemia, BACK PAIN (04/13/2010), BACK STRAIN, LUMBAR (04/13/2010), Cancer ([place]) (2017), DIABETES MELLITUS, TYPE II (06/02/2008), History of blood transfusion, History of chemotherapy, History of radiation therapy, transfusion of whole blood  (09/2015), HYPERLIPIDEMIA (06/02/2008), and HYPERTENSION (06/02/2008).  Encounter for well adult exam with abnormal findings Age and sex appropriate education and counseling updated with regular exercise and diet Referrals for preventative services - declines mammogram, dxa, eye exam for now Immunizations addressed - declines shingrix, tdap - for pneumovax Smoking counseling  - none needed Evidence for depression or other mood disorder - none significant Most recent labs reviewed. I have personally reviewed and have noted: 1) the patient's medical and social history 2) The patient's current medications and supplements 3) The patient's height, weight, and BMI have been recorded in the chart   Hyperlipidemia Lab Results  Component Value Date   LDLCALC 98 09/19/2021   Stable, pt to continue current statin with good compliance - liptior 20 qd  Essential hypertension BP Readings from Last 3 Encounters:  09/19/21 (!) 142/76  09/04/20 (!) 153/78  09/01/20 (!) 157/84   uncontroled pt to continue medical treatment tenormin 50 mg qd, but increased amlodipine 10 qd   Diabetes ([place]) Lab Results  Component Value Date   HGBA1C 7.8 (H) 09/19/2021   uncontrolled, pt to continue current medical treatment glipizide 5 qd, metformin 1000 bid, actos 45 mg qd, januvia 100 mg qd all with good compliance  Followup: Return in about 6 months (around 03/21/2022).  Cathlean Cower, MD 09/20/2021 9:31 PM [facility]

## 2021-09-20 ENCOUNTER — Encounter: Payer: Self-pay | Admitting: Internal Medicine

## 2021-09-20 LAB — URINALYSIS, ROUTINE W REFLEX MICROSCOPIC
Bilirubin Urine: NEGATIVE
Hgb urine dipstick: NEGATIVE
Ketones, ur: NEGATIVE
Leukocytes,Ua: NEGATIVE
Nitrite: NEGATIVE
RBC / HPF: NONE SEEN (ref 0–?)
Specific Gravity, Urine: 1.015 (ref 1.000–1.030)
Total Protein, Urine: NEGATIVE
Urine Glucose: 100 — AB
Urobilinogen, UA: 0.2 (ref 0.0–1.0)
pH: 7 (ref 5.0–8.0)

## 2021-09-20 NOTE — Assessment & Plan Note (Addendum)
Age and sex appropriate education and counseling updated with regular exercise and diet Referrals for preventative services - declines mammogram, dxa, eye exam for now Immunizations addressed - declines shingrix, tdap - for pneumovax Smoking counseling  - none needed Evidence for depression or other mood disorder - none significant Most recent labs reviewed. I have personally reviewed and have noted: 1) the patient's medical and social history 2) The patient's current medications and supplements 3) The patient's height, weight, and BMI have been recorded in the chart

## 2021-09-20 NOTE — Assessment & Plan Note (Signed)
BP Readings from Last 3 Encounters:  09/19/21 (!) 142/76  09/04/20 (!) 153/78  09/01/20 (!) 157/84   uncontroled pt to continue medical treatment tenormin 50 mg qd, but increased amlodipine 10 qd

## 2021-09-20 NOTE — Assessment & Plan Note (Signed)
Lab Results  Component Value Date   HGBA1C 7.8 (H) 09/19/2021   uncontrolled, pt to continue current medical treatment glipizide 5 qd, metformin 1000 bid, actos 45 mg qd, januvia 100 mg qd all with good compliance

## 2021-09-20 NOTE — Assessment & Plan Note (Addendum)
Lab Results  Component Value Date   LDLCALC 98 09/19/2021   Stable, pt to continue current statin with good compliance - liptior 20 qd

## 2021-10-15 ENCOUNTER — Telehealth: Payer: Self-pay | Admitting: Internal Medicine

## 2021-10-15 NOTE — Telephone Encounter (Signed)
Pt called in requesting a call back in regards to last set of lab results.   Please call back to review

## 2021-10-19 NOTE — Telephone Encounter (Signed)
Letter was sent June 8 -   The test results show that your current treatment is OK, as the tests are stable , except the A1c is still slightly high.  Please remember to take all of your medications as prescribed, as you are taking 4 medications for sugar already. If things become worse, we could consider changing one of your medications to insulin, or at least referral to an endocrinologist.     There is no other need for change of treatment or further evaluation based on these results, at this time.  thanks

## 2021-10-19 NOTE — Telephone Encounter (Signed)
Patient notified

## 2021-11-18 ENCOUNTER — Other Ambulatory Visit: Payer: Self-pay | Admitting: Internal Medicine

## 2021-11-18 NOTE — Telephone Encounter (Signed)
Please refill as per office routine med refill policy (all routine meds to be refilled for 3 mo or monthly (per pt preference) up to one year from last visit, then month to month grace period for 3 mo, then further med refills will have to be denied) ? ?

## 2022-03-26 ENCOUNTER — Ambulatory Visit: Payer: Medicare HMO | Admitting: Internal Medicine

## 2022-03-31 ENCOUNTER — Other Ambulatory Visit: Payer: Self-pay | Admitting: Internal Medicine

## 2022-04-22 ENCOUNTER — Telehealth: Payer: Self-pay | Admitting: Internal Medicine

## 2022-04-22 MED ORDER — SITAGLIPTIN PHOSPHATE 100 MG PO TABS
100.0000 mg | ORAL_TABLET | Freq: Every day | ORAL | 5 refills | Status: DC
Start: 2022-04-22 — End: 2023-05-12

## 2022-04-22 NOTE — Telephone Encounter (Signed)
See below

## 2022-04-22 NOTE — Telephone Encounter (Signed)
Is it possible to receive a short supply of Januvia until patient assistance arrives

## 2022-04-22 NOTE — Telephone Encounter (Signed)
Patient called and wants to know if Dr. Jenny Reichmann could send her an rx in for something similar to her Januvia until she can get her patient assistance going.  Please advise.  Pharmacy:  [facility] on [address]  Patients #  [phone]

## 2022-04-22 NOTE — Telephone Encounter (Signed)
Patient assistance forms completed, patient informed of 30 days supply until order comes in. Form placed up front for pick up

## 2022-04-22 NOTE — Telephone Encounter (Signed)
Patient would like only 10-20 pills called in until she get's hers.  What you called in was $197.00

## 2022-04-22 NOTE — Telephone Encounter (Signed)
Ok I also sent month to month supply to local [facility]

## 2022-04-25 NOTE — Telephone Encounter (Signed)
Pt has picked up forms.   Pt was made aware that she has an RX for sitaGLIP[facility]) ready for her at [place], and that she can request a smaller quantity from the pharmacist to see if it will lower the cost.

## 2022-05-13 ENCOUNTER — Telehealth: Payer: Self-pay | Admitting: Internal Medicine

## 2022-05-13 NOTE — Telephone Encounter (Signed)
Patient called and stated she needs Januvia '100mg'$ . Patient stated it costs too mcuh at pharmacy and she has sent paperwork in two weeks ago. Patient requested Dr. Gwynn Burly nurse to give her a call at [phone].

## 2022-05-14 NOTE — Telephone Encounter (Signed)
Unfortunately there is no less expensive med , since she is already taking all 3 of the only generics we have - the glucotrol, metformin and actos  So anything I prescribe has to be brand name  I would ok with changing Januvia to another medication similar to that if she could let us know which one might be better covered by her insurance

## 2022-05-14 NOTE — Telephone Encounter (Signed)
Patient still has not received patient assistance as forms were completed by our office and picked up by patient and mailed to [employer]. Patient is requesting a generic version of Januvia that is more affordable, please advise if possible.

## 2022-05-15 NOTE — Telephone Encounter (Signed)
Patient will contact insurance for coverage of something other than Januvia

## 2022-06-08 ENCOUNTER — Other Ambulatory Visit: Payer: Self-pay | Admitting: Internal Medicine

## 2022-06-17 ENCOUNTER — Encounter: Payer: Self-pay | Admitting: Hematology

## 2022-06-18 ENCOUNTER — Ambulatory Visit (INDEPENDENT_AMBULATORY_CARE_PROVIDER_SITE_OTHER): Payer: Medicare HMO

## 2022-06-18 VITALS — Ht 65.0 in | Wt 178.0 lb

## 2022-06-18 DIAGNOSIS — Z0000 Encounter for general adult medical examination without abnormal findings: Secondary | ICD-10-CM

## 2022-06-18 NOTE — Patient Instructions (Signed)
Karen Good , Thank you for taking time to come for your Medicare Wellness Visit. I appreciate your ongoing commitment to your health goals. Please review the following plan we discussed and let me know if I can assist you in the future.   These are the goals we discussed:  Goals      Remain active and indpendent        This is a list of the screening recommended for you and due dates:  Health Maintenance  Topic Date Due   Zoster (Shingles) Vaccine (1 of 2) Never done   DTaP/Tdap/Td vaccine (2 - Tdap) 06/02/2018   Eye exam for diabetics  06/06/2019   Mammogram  03/06/2020   Flu Shot  11/13/2021   COVID-19 Vaccine (7 - 2023-24 season) 12/14/2021   Hemoglobin A1C  03/21/2022   DEXA scan (bone density measurement)  09/20/2022*   Yearly kidney function blood test for diabetes  09/20/2022   Yearly kidney health urinalysis for diabetes  09/20/2022   Complete foot exam   09/20/2022   Medicare Annual Wellness Visit  06/18/2023   Colon Cancer Screening  09/01/2025   Pneumonia Vaccine  Completed   Hepatitis C Screening: USPSTF Recommendation to screen - Ages 18-79 yo.  Completed   HPV Vaccine  Aged Out  *Topic was postponed. The date shown is not the original due date.    Advanced directives: Advance directive discussed with you today. I have provided a copy for you to complete at home and have notarized. Once this is complete please bring a copy in to our office so we can scan it into your chart.   Conditions/risks identified: Aim for 30 minutes of exercise or brisk walking, 6-8 glasses of water, and 5 servings of fruits and vegetables each day.   Next appointment: Follow up in one year for your annual wellness visit    Preventive Care 65 Years and Older, Female Preventive care refers to lifestyle choices and visits with your health care provider that can promote health and wellness. What does preventive care include? A yearly physical exam. This is also called an annual well  check. Dental exams once or twice a year. Routine eye exams. Ask your health care provider how often you should have your eyes checked. Personal lifestyle choices, including: Daily care of your teeth and gums. Regular physical activity. Eating a healthy diet. Avoiding tobacco and drug use. Limiting alcohol use. Practicing safe sex. Taking low-dose aspirin every day. Taking vitamin and mineral supplements as recommended by your health care provider. What happens during an annual well check? The services and screenings done by your health care provider during your annual well check will depend on your age, overall health, lifestyle risk factors, and family history of disease. Counseling  Your health care provider may ask you questions about your: Alcohol use. Tobacco use. Drug use. Emotional well-being. Home and relationship well-being. Sexual activity. Eating habits. History of falls. Memory and ability to understand (cognition). Work and work Statistician. Reproductive health. Screening  You may have the following tests or measurements: Height, weight, and BMI. Blood pressure. Lipid and cholesterol levels. These may be checked every 5 years, or more frequently if you are over 35 years old. Skin check. Lung cancer screening. You may have this screening every year starting at age 42 if you have a 30-pack-year history of smoking and currently smoke or have quit within the past 15 years. Fecal occult blood test (FOBT) of the stool. You may have this  test every year starting at age 31. Flexible sigmoidoscopy or colonoscopy. You may have a sigmoidoscopy every 5 years or a colonoscopy every 10 years starting at age 3. Hepatitis C blood test. Hepatitis B blood test. Sexually transmitted disease (STD) testing. Diabetes screening. This is done by checking your blood sugar (glucose) after you have not eaten for a while (fasting). You may have this done every 1-3 years. Bone density scan.  This is done to screen for osteoporosis. You may have this done starting at age 16. Mammogram. This may be done every 1-2 years. Talk to your health care provider about how often you should have regular mammograms. Talk with your health care provider about your test results, treatment options, and if necessary, the need for more tests. Vaccines  Your health care provider may recommend certain vaccines, such as: Influenza vaccine. This is recommended every year. Tetanus, diphtheria, and acellular pertussis (Tdap, Td) vaccine. You may need a Td booster every 10 years. Zoster vaccine. You may need this after age 48. Pneumococcal 13-valent conjugate (PCV13) vaccine. One dose is recommended after age 64. Pneumococcal polysaccharide (PPSV23) vaccine. One dose is recommended after age 35. Talk to your health care provider about which screenings and vaccines you need and how often you need them. This information is not intended to replace advice given to you by your health care provider. Make sure you discuss any questions you have with your health care provider. Document Released: 04/28/2015 Document Revised: 12/20/2015 Document Reviewed: 01/31/2015 Elsevier Interactive Patient Education  2017 [employer] Prevention in the Home Falls can cause injuries. They can happen to people of all ages. There are many things you can do to make your home safe and to help prevent falls. What can I do on the outside of my home? Regularly fix the edges of walkways and driveways and fix any cracks. Remove anything that might make you trip as you walk through a door, such as a raised step or threshold. Trim any bushes or trees on the path to your home. Use bright outdoor lighting. Clear any walking paths of anything that might make someone trip, such as rocks or tools. Regularly check to see if handrails are loose or broken. Make sure that both sides of any steps have handrails. Any raised decks and porches  should have guardrails on the edges. Have any leaves, snow, or ice cleared regularly. Use sand or salt on walking paths during winter. Clean up any spills in your garage right away. This includes oil or grease spills. What can I do in the bathroom? Use night lights. Install grab bars by the toilet and in the tub and shower. Do not use towel bars as grab bars. Use non-skid mats or decals in the tub or shower. If you need to sit down in the shower, use a plastic, non-slip stool. Keep the floor dry. Clean up any water that spills on the floor as soon as it happens. Remove soap buildup in the tub or shower regularly. Attach bath mats securely with double-sided non-slip rug tape. Do not have throw rugs and other things on the floor that can make you trip. What can I do in the bedroom? Use night lights. Make sure that you have a light by your bed that is easy to reach. Do not use any sheets or blankets that are too big for your bed. They should not hang down onto the floor. Have a firm chair that has side arms. You can  use this for support while you get dressed. Do not have throw rugs and other things on the floor that can make you trip. What can I do in the kitchen? Clean up any spills right away. Avoid walking on wet floors. Keep items that you use a lot in easy-to-reach places. If you need to reach something above you, use a strong step stool that has a grab bar. Keep electrical cords out of the way. Do not use floor polish or wax that makes floors slippery. If you must use wax, use non-skid floor wax. Do not have throw rugs and other things on the floor that can make you trip. What can I do with my stairs? Do not leave any items on the stairs. Make sure that there are handrails on both sides of the stairs and use them. Fix handrails that are broken or loose. Make sure that handrails are as long as the stairways. Check any carpeting to make sure that it is firmly attached to the stairs.  Fix any carpet that is loose or worn. Avoid having throw rugs at the top or bottom of the stairs. If you do have throw rugs, attach them to the floor with carpet tape. Make sure that you have a light switch at the top of the stairs and the bottom of the stairs. If you do not have them, ask someone to add them for you. What else can I do to help prevent falls? Wear shoes that: Do not have high heels. Have rubber bottoms. Are comfortable and fit you well. Are closed at the toe. Do not wear sandals. If you use a stepladder: Make sure that it is fully opened. Do not climb a closed stepladder. Make sure that both sides of the stepladder are locked into place. Ask someone to hold it for you, if possible. Clearly mark and make sure that you can see: Any grab bars or handrails. First and last steps. Where the edge of each step is. Use tools that help you move around (mobility aids) if they are needed. These include: Canes. Walkers. Scooters. Crutches. Turn on the lights when you go into a dark area. Replace any light bulbs as soon as they burn out. Set up your furniture so you have a clear path. Avoid moving your furniture around. If any of your floors are uneven, fix them. If there are any pets around you, be aware of where they are. Review your medicines with your doctor. Some medicines can make you feel dizzy. This can increase your chance of falling. Ask your doctor what other things that you can do to help prevent falls. This information is not intended to replace advice given to you by your health care provider. Make sure you discuss any questions you have with your health care provider. Document Released: 01/26/2009 Document Revised: 09/07/2015 Document Reviewed: 05/06/2014 Elsevier Interactive Patient Education  2017 [employer].

## 2022-06-18 NOTE — Progress Notes (Signed)
Subjective:   Karen Good is a 67 y.o. female who presents for an Initial Medicare Annual Wellness Visit.  I connected with  Karen Good on 06/18/22 by a audio enabled telemedicine application and verified that I am speaking with the correct person using two identifiers.  Patient Location: Home  Provider Location: Home Office  I discussed the limitations of evaluation and management by telemedicine. The patient expressed understanding and agreed to proceed.  Review of Systems     Cardiac Risk Factors include: advanced age (>69mn, >>11women);diabetes mellitus;dyslipidemia;hypertension;sedentary lifestyle     Objective:    Today's Vitals   06/18/22 1522  Weight: 178 lb (80.7 kg)  Height: '5\' 5"'$  (1.651 m)   Body mass index is 29.62 kg/m.     06/18/2022    4:40 PM 05/23/2017   10:32 AM 04/25/2017    3:21 PM 06/28/2016    3:07 PM 04/22/2016    3:14 PM 03/28/2016    3:20 PM 02/22/2016    4:05 PM  Advanced Directives  Does Patient Have a Medical Advance Directive? No No No No No No No  Would patient like information on creating a medical advance directive? Yes (MAU/Ambulatory/Procedural Areas - Information given)    No - Patient declined  No - patient declined information    Current Medications (verified) Outpatient Encounter Medications as of 06/18/2022  Medication Sig   amLODipine (NORVASC) 10 MG tablet Take 1 tablet (10 mg total) by mouth daily.   atenolol (TENORMIN) 50 MG tablet Take 1 tablet (50 mg total) by mouth daily.   atorvastatin (LIPITOR) 20 MG tablet Take 1 tablet (20 mg total) by mouth daily.   Blood Glucose Monitoring Suppl (ONE TOUCH ULTRA 2) w/Device KIT Use as directed daily   cyanocobalamin ([employer] VITAMIN B12) 1000 MCG tablet Take 1 tablet (1,000 mcg total) by mouth daily.   glipiZIDE (GLUCOTROL XL) 5 MG 24 hr tablet Take 2 tablets (10 mg total) by mouth daily.   glucose blood (ONE TOUCH ULTRA TEST) test strip USE AS INSTRUCTED ONCE DAILY E11.9    hydrochlorothiazide (HYDRODIURIL) 25 MG tablet Take 1 tablet (25 mg total) by mouth daily. Must keep scheduled appt for future refills   ibuprofen (ADVIL,MOTRIN) 200 MG tablet Take 400 mg by mouth every 6 (six) hours as needed for mild pain.   Lancets Misc. MISC Use as directed once daily   metFORMIN (GLUCOPHAGE) 1000 MG tablet TAKE 1 TABLET (1,000 MG TOTAL) BY MOUTH TWICE A DAY WITH FOOD   pioglitazone (ACTOS) 45 MG tablet Take 1 tablet (45 mg total) by mouth daily.   potassium chloride SA (KLOR-CON M20) 20 MEQ tablet Take 2 tablets (40 mEq total) by mouth daily.   telmisartan (MICARDIS) 80 MG tablet Take 1 tablet (80 mg total) by mouth daily.   sitaGLIPtin (JANUVIA) 100 MG tablet Take 1 tablet (100 mg total) by mouth daily. (Patient not taking: Reported on 06/18/2022)   [DISCONTINUED] nebivolol (BYSTOLIC) 10 MG tablet Take 1 tablet (10 mg total) by mouth daily.   [DISCONTINUED] valsartan (DIOVAN) 320 MG tablet Take 1 tablet (320 mg total) by mouth daily.   No facility-administered encounter medications on file as of 06/18/2022.    Allergies (verified) Patient has no known allergies.   History: Past Medical History:  Diagnosis Date   Anemia    hx of    BACK PAIN 04/13/2010   BACK STRAIN, LUMBAR 04/13/2010   [place]   [place]   DIABETES  MELLITUS, TYPE II 06/02/2008   on meds   History of blood transfusion    History of chemotherapy    History of radiation therapy    Hx of transfusion of whole blood 09/2015   prior to colon surgery - [facility]   HYPERLIPIDEMIA 06/02/2008   on meds   HYPERTENSION 06/02/2008   on meds   Past Surgical History:  Procedure Laterality Date   COLONOSCOPY Left 09/24/2015   Procedure: COLONOSCOPY;  Dax: Juanita Craver, MD;  Location: [facility] ENDOSCOPY;  Service: Endoscopy;  Laterality: Left;   COLONOSCOPY  09/2015   Dr Delos Haring [place] -suprep(exc)MAC-TA   ESOPHAGOGASTRODUODENOSCOPY N/A 09/24/2015   Procedure: ESOPHAGOGASTRODUODENOSCOPY (EGD);  Ring:  Juanita Craver, MD;  Location: [facility] ENDOSCOPY;  Service: Endoscopy;  Laterality: N/A;   EUS N/A 10/05/2015   Procedure: LOWER ENDOSCOPIC ULTRASOUND (EUS);  Mewborn: Milus Banister, MD;  Location: [facility] ENDOSCOPY;  Service: Endoscopy;  Laterality: N/A;   s/p breast biopsy Left 07/2005   benign   TUBAL LIGATION     Family History  Problem Relation Age of Onset   Diabetes Mother    Heart disease Father    Diabetes Father    Hypertension Father    Diabetes Sister    Diabetes Other    Diabetes Brother    Colon cancer Neg Hx    Rectal cancer Neg Hx    Stomach cancer Neg Hx    Colon polyps Neg Hx    Esophageal cancer Neg Hx    Social History   Socioeconomic History   Marital status: Single    Spouse name: Not on file   Number of children: 2   Years of education: Not on file   Highest education level: Not on file  Occupational History    Employer: [employer]  Tobacco Use   Smoking status: Never   Smokeless tobacco: Never  Vaping Use   Vaping Use: Never used  Substance and Sexual Activity   Alcohol use: Yes    Comment: socially   Drug use: No   Sexual activity: Yes    Birth control/protection: Post-menopausal  Other Topics Concern   Not on file  Social History Narrative   Not on file   Social Determinants of Health   Financial Resource Strain: Low Risk  (06/18/2022)   Overall Financial Resource Strain (CARDIA)    Difficulty of Paying Living Expenses: Not hard at all  Food Insecurity: No Food Insecurity (06/18/2022)   Hunger Vital Sign    Worried About Running Out of Food in the Last Year: Never true    Ran Out of Food in the Last Year: Never true  Transportation Needs: No Transportation Needs (06/18/2022)   PRAPARE - Hydrologist (Medical): No    Lack of Transportation (Non-Medical): No  Physical Activity: Inactive (06/18/2022)   Exercise Vital Sign    Days of Exercise per Week: 0 days    Minutes of Exercise per Session: 0 min  Stress: No Stress  Concern Present (06/18/2022)   [place]    Feeling of Stress : Not at all  Social Connections: Moderately Integrated (06/18/2022)   Social Connection and Isolation Panel [NHANES]    Frequency of Communication with Friends and Family: More than three times a week    Frequency of Social Gatherings with Friends and Family: More than three times a week    Attends Religious Services: 1 to 4 times per year  Active Member of Clubs or Organizations: No    Attends Archivist Meetings: Never    Marital Status: Married    Tobacco Counseling Counseling given: Not Answered   Clinical Intake:  Pre-visit preparation completed: Yes  Pain : No/denies pain  Diabetes: Yes CBG done?: No Did pt. bring in CBG monitor from home?: No  How often do you need to have someone help you when you read instructions, pamphlets, or other written materials from your doctor or pharmacy?: 1 - Never  Diabetic?Yes   Nutrition Risk Assessment:  Has the patient had any N/V/D within the last 2 months?  No  Does the patient have any non-healing wounds?  No  Has the patient had any unintentional weight loss or weight gain?  No   Diabetes:  Is the patient diabetic?  Yes  If diabetic, was a CBG obtained today?  No  Did the patient bring in their glucometer from home?  No  How often do you monitor your CBG's? As needed.   Financial Strains and Diabetes Management:  Are you having any financial strains with the device, your supplies or your medication? Yes . Cost of Januvia; patient is awaiting completion of application for assistance through manufacturer Does the patient want to be seen by Chronic Care Management for management of their diabetes?  No  Would the patient like to be referred to a Nutritionist or for Diabetic Management?  No   Diabetic Exams:  Diabetic Eye Exam: Overdue for diabetic eye exam. Pt has been advised about the  importance in completing this exam. Patient advised to call and schedule an eye exam. Diabetic Foot Exam: Completed 09/19/21   Interpreter Needed?: No  Information entered by :: Denman George LPN   Activities of Daily Living    06/18/2022    4:40 PM  In your present state of health, do you have any difficulty performing the following activities:  Hearing? 0  Vision? 0  Difficulty concentrating or making decisions? 0  Walking or climbing stairs? 0  Dressing or bathing? 0  Doing errands, shopping? 0  Preparing Food and eating ? N  Using the Toilet? N  In the past six months, have you accidently leaked urine? N  Do you have problems with loss of bowel control? N  Managing your Medications? N  Managing your Finances? N  Housekeeping or managing your Housekeeping? N    Patient Care Team: Biagio Borg, MD as PCP - Eber Jones, MD as Consulting Physician (Radiation Oncology) Leighton Ruff, MD as Consulting Physician (General Surgery) Milus Banister, MD as Attending Physician (Gastroenterology) Tania Ade, RN as Registered Nurse  Indicate any recent Medical Services you may have received from other than [facility] providers in the past year (date may be approximate).     Assessment:   This is a routine wellness examination for Sharida.  Hearing/Vision screen Hearing Screening - Comments:: Denies hearing difficulties  Vision Screening - Comments:: No vision problems; will schedule routine eye exam soon    Dietary issues and exercise activities discussed: Current Exercise Habits: The patient does not participate in regular exercise at present   Goals Addressed             This Visit's Progress    Remain active and indpendent        Depression Screen    06/18/2022    4:38 PM 09/19/2021    2:46 PM 09/19/2021    2:31 PM 08/14/2020  3:14 PM 08/05/2019    2:05 PM 06/12/2018    2:27 PM 06/06/2017    2:42 PM  PHQ 2/9 Scores  PHQ - 2 Score 0 0 0 0 0 0 0  PHQ- 9  Score   0        Fall Risk    06/18/2022    4:37 PM 09/19/2021    2:46 PM 09/19/2021    2:31 PM 08/14/2020    3:14 PM 08/14/2020    2:59 PM  [place] in the past year? 0 0 0 0 0  Number falls in past yr: 0 0 0  0  Injury with Fall? 0 0 0  0  Risk for fall due to : No Fall Risks      Follow up Falls prevention discussed;Education provided;Falls evaluation completed        FALL RISK PREVENTION PERTAINING TO THE HOME:  Any stairs in or around the home? No  If so, are there any without handrails? No  Home free of loose throw rugs in walkways, pet beds, electrical cords, etc? Yes  Adequate lighting in your home to reduce risk of falls? Yes   ASSISTIVE DEVICES UTILIZED TO PREVENT FALLS:  Life alert? No  Use of a cane, walker or w/c? No  Grab bars in the bathroom? Yes  Shower chair or bench in shower? No  Elevated toilet seat or a handicapped toilet? Yes   TIMED UP AND GO:  Was the test performed? No . Telephonic visit   Cognitive Function:        06/18/2022    4:40 PM  6CIT Screen  What Year? 0 points  What month? 0 points  What time? 0 points  Count back from 20 0 points  Months in reverse 0 points  Repeat phrase 0 points  Total Score 0 points    Immunizations Immunization History  Administered Date(s) Administered   Influenza Whole 01/14/2008   Influenza, High Dose Seasonal PF 01/11/2019   Influenza-Unspecified 01/13/2017, 01/06/2018, 01/17/2020   PFIZER Comirnaty(Gray Top)Covid-19 Tri-Sucrose Vaccine 08/12/2020   PFIZER(Purple Top)SARS-COV-2 Vaccination 06/08/2019, 06/29/2019, 01/17/2020, 08/12/2020, 03/10/2021   Pneumococcal Conjugate-13 01/29/2013   Pneumococcal Polysaccharide-23 06/02/2008, 01/18/2011, 09/19/2021   Td 06/02/2008    TDAP status: Due, Education has been provided regarding the importance of this vaccine. Advised may receive this vaccine at local pharmacy or Health Dept. Aware to provide a copy of the vaccination record if obtained from  local pharmacy or Health Dept. Verbalized acceptance and understanding.  Flu Vaccine status: Declined, Education has been provided regarding the importance of this vaccine but patient still declined. Advised may receive this vaccine at local pharmacy or Health Dept. Aware to provide a copy of the vaccination record if obtained from local pharmacy or Health Dept. Verbalized acceptance and understanding.  Pneumococcal vaccine status: Up to date  Covid-19 vaccine status: Information provided on how to obtain vaccines.   Qualifies for Shingles Vaccine? Yes   Zostavax completed No   Shingrix Completed?: No.    Education has been provided regarding the importance of this vaccine. Patient has been advised to call insurance company to determine out of pocket expense if they have not yet received this vaccine. Advised may also receive vaccine at local pharmacy or Health Dept. Verbalized acceptance and understanding.  Screening Tests Health Maintenance  Topic Date Due   Zoster Vaccines- Shingrix (1 of 2) Never done   DTaP/Tdap/Td (2 - Tdap) 06/02/2018   OPHTHALMOLOGY EXAM  06/06/2019   COVID-19 Vaccine (7 - 2023-24 season) 12/14/2021   HEMOGLOBIN A1C  03/21/2022   INFLUENZA VACCINE  07/14/2022 (Originally 11/13/2021)   DEXA SCAN  09/20/2022 (Originally 10/04/2018)   MAMMOGRAM  06/18/2023 (Originally 03/06/2020)   Diabetic kidney evaluation - eGFR measurement  09/20/2022   Diabetic kidney evaluation - Urine ACR  09/20/2022   FOOT EXAM  09/20/2022   Medicare Annual Wellness (AWV)  06/18/2023   COLONOSCOPY (Pts 45-102yr Insurance coverage will need to be confirmed)  09/01/2025   Pneumonia Vaccine 67 Years old  Completed   Hepatitis C Screening  Completed   HPV VACCINES  Aged Out    Health Maintenance  Health Maintenance Due  Topic Date Due   Zoster Vaccines- Shingrix (1 of 2) Never done   DTaP/Tdap/Td (2 - Tdap) 06/02/2018   OPHTHALMOLOGY EXAM  06/06/2019   COVID-19 Vaccine (7 - 2023-24  season) 12/14/2021   HEMOGLOBIN A1C  03/21/2022    Colorectal cancer screening: Type of screening: Colonoscopy. Completed 09/01/20. Repeat every 5 years  Mammogram status: declines at this time   Bone density status: declines at this time  Lung Cancer Screening: (Low Dose CT Chest recommended if Age 69-80years, 30 pack-year currently smoking OR have quit w/in 15years.) does not qualify.   Lung Cancer Screening Referral: n/a  Additional Screening:  Hepatitis C Screening: does qualify; Completed 09/22/15  Vision Screening: Recommended annual ophthalmology exams for early detection of glaucoma and other disorders of the eye. Is the patient up to date with their annual eye exam?  No  Who is the provider or what is the name of the office in which the patient attends annual eye exams? none If pt is not established with a provider, would they like to be referred to a provider to establish care? No .   Dental Screening: Recommended annual dental exams for proper oral hygiene  Community Resource Referral / Chronic Care Management: CRR required this visit?  No   CCM required this visit?  No      Plan:     I have personally reviewed and noted the following in the patient's chart:   Medical and social history Use of alcohol, tobacco or illicit drugs  Current medications and supplements including opioid prescriptions. Patient is not currently taking opioid prescriptions. Functional ability and status Nutritional status Physical activity Advanced directives List of other physicians Hospitalizations, surgeries, and ER visits in previous 12 months Vitals Screenings to include cognitive, depression, and falls Referrals and appointments  In addition, I have reviewed and discussed with patient certain preventive protocols, quality metrics, and best practice recommendations. A written personalized care plan for preventive services as well as general preventive health recommendations were  provided to patient.     [place]anetta [place]ders [place]  [phone]  Due to this being a virtual visit, the after visit summary with patients personalized plan was offered to patient via mail or my-chart. per request, patient was mailed a copy of AVS.  Nurse Notes: No additional concerns other than awaiting Januvia assistance

## 2022-07-17 ENCOUNTER — Telehealth: Payer: Self-pay | Admitting: Radiology

## 2022-07-17 NOTE — Telephone Encounter (Signed)
She was also calling about the patient assistance program [employer] forms that were filled out by one of Dr. Gwynn Burly nurses. She said it was missing some information.  Please call her back at [place]

## 2022-07-23 ENCOUNTER — Other Ambulatory Visit: Payer: Self-pay | Admitting: Internal Medicine

## 2022-09-12 ENCOUNTER — Other Ambulatory Visit: Payer: Self-pay | Admitting: Internal Medicine

## 2022-09-20 ENCOUNTER — Other Ambulatory Visit: Payer: Self-pay

## 2022-09-20 ENCOUNTER — Other Ambulatory Visit: Payer: Self-pay | Admitting: Internal Medicine

## 2022-09-27 ENCOUNTER — Other Ambulatory Visit: Payer: Self-pay | Admitting: Internal Medicine

## 2022-10-03 ENCOUNTER — Other Ambulatory Visit: Payer: Self-pay

## 2022-10-03 ENCOUNTER — Other Ambulatory Visit: Payer: Self-pay | Admitting: Internal Medicine

## 2022-10-25 ENCOUNTER — Other Ambulatory Visit: Payer: Self-pay | Admitting: Internal Medicine

## 2022-10-29 ENCOUNTER — Other Ambulatory Visit: Payer: Self-pay | Admitting: Internal Medicine

## 2022-10-29 ENCOUNTER — Other Ambulatory Visit: Payer: Self-pay

## 2022-11-02 ENCOUNTER — Other Ambulatory Visit: Payer: Self-pay | Admitting: Internal Medicine

## 2022-11-04 ENCOUNTER — Other Ambulatory Visit: Payer: Self-pay

## 2022-11-27 ENCOUNTER — Other Ambulatory Visit: Payer: Self-pay | Admitting: Internal Medicine

## 2022-12-08 ENCOUNTER — Other Ambulatory Visit: Payer: Self-pay | Admitting: Internal Medicine

## 2022-12-08 DIAGNOSIS — E876 Hypokalemia: Secondary | ICD-10-CM

## 2022-12-09 ENCOUNTER — Other Ambulatory Visit: Payer: Self-pay

## 2022-12-10 ENCOUNTER — Encounter: Payer: Medicare HMO | Admitting: Internal Medicine

## 2022-12-17 ENCOUNTER — Other Ambulatory Visit: Payer: Self-pay

## 2022-12-17 ENCOUNTER — Other Ambulatory Visit: Payer: Self-pay | Admitting: Internal Medicine

## 2022-12-31 ENCOUNTER — Ambulatory Visit (INDEPENDENT_AMBULATORY_CARE_PROVIDER_SITE_OTHER): Payer: Medicare HMO | Admitting: Internal Medicine

## 2022-12-31 ENCOUNTER — Encounter: Payer: Self-pay | Admitting: Internal Medicine

## 2022-12-31 VITALS — BP 130/82 | HR 94 | Temp 98.2°F | Ht 65.0 in | Wt 175.0 lb

## 2022-12-31 DIAGNOSIS — E7800 Pure hypercholesterolemia, unspecified: Secondary | ICD-10-CM

## 2022-12-31 DIAGNOSIS — Z0001 Encounter for general adult medical examination with abnormal findings: Secondary | ICD-10-CM

## 2022-12-31 DIAGNOSIS — E559 Vitamin D deficiency, unspecified: Secondary | ICD-10-CM

## 2022-12-31 DIAGNOSIS — E1165 Type 2 diabetes mellitus with hyperglycemia: Secondary | ICD-10-CM

## 2022-12-31 DIAGNOSIS — Z794 Long term (current) use of insulin: Secondary | ICD-10-CM | POA: Diagnosis not present

## 2022-12-31 DIAGNOSIS — Z7984 Long term (current) use of oral hypoglycemic drugs: Secondary | ICD-10-CM

## 2022-12-31 DIAGNOSIS — I10 Essential (primary) hypertension: Secondary | ICD-10-CM

## 2022-12-31 DIAGNOSIS — Z23 Encounter for immunization: Secondary | ICD-10-CM

## 2022-12-31 DIAGNOSIS — E538 Deficiency of other specified B group vitamins: Secondary | ICD-10-CM | POA: Diagnosis not present

## 2022-12-31 DIAGNOSIS — Z0000 Encounter for general adult medical examination without abnormal findings: Secondary | ICD-10-CM

## 2022-12-31 NOTE — Assessment & Plan Note (Signed)
Lab Results  Component Value Date   HGBA1C 7.8 (H) 09/19/2021   uncontrolled, pt to continue current medical treatment glipizide xl 10 every day, metofrmin 1000 bid, actos 45 every day, januvia 25 every day, and pt will call if wants to start ozempic

## 2022-12-31 NOTE — Assessment & Plan Note (Signed)
BP Readings from Last 3 Encounters:  12/31/22 130/82  09/19/21 (!) 142/76  09/04/20 (!) 153/78   Stable, pt to continue medical treatment norvasc 10 every day, tenormin 50 every day, hct 25 every day, micardis 80 qd

## 2022-12-31 NOTE — Assessment & Plan Note (Signed)
Age and sex appropriate education and counseling updated with regular exercise and diet Referrals for preventative services - pt to call for optho soon, declines dxa Immunizations addressed - for shingrix and tdap at pharmacy, for flu shot today Smoking counseling  - none needed Evidence for depression or other mood disorder - none significant Most recent labs reviewed. I have personally reviewed and have noted: 1) the patient's medical and social history 2) The patient's current medications and supplements 3) The patient's height, weight, and BMI have been recorded in the chart

## 2022-12-31 NOTE — Progress Notes (Signed)
Patient ID: Karen Good, female   DOB: 1955/08/30, 67 y.o.   MRN: [id number]         Chief Complaint:: wellness exam and dm, htn, hld       HPI:  Karen Good is a 67 y.o. female here for wellness exam; plans to call for eye doctor soon, for flu shot today, declines dxa, for shingrix and tdap at pharmacy o/w up to date                        Also husband passed in mar with LVAD heart disease.  Greif improving.  Pt denies chest pain, increased sob or doe, wheezing, orthopnea, PND, [name], palpitations, dizziness or syncope.   Pt denies polydipsia, polyuria, [name], wt loss, night sweats, loss of appetite, or other constitutional symptoms     Wt Readings from Last 3 Encounters:  12/31/22 175 lb (79.4 kg)  06/18/22 178 lb (80.7 kg)  09/19/21 178 lb (80.7 kg)   BP Readings from Last 3 Encounters:  12/31/22 130/82  09/19/21 (!) 142/76  09/04/20 (!) 153/78   Immunization History  Administered Date(s) Administered   Fluad Trivalent(High Dose 65+) 12/31/2022   Influenza Whole 01/14/2008   Influenza, High Dose Seasonal PF 01/11/2019   Influenza-Unspecified 01/13/2017, 01/06/2018, 01/17/2020   PFIZER Comirnaty(Gray Top)Covid-19 Tri-Sucrose Vaccine 08/12/2020   PFIZER(Purple Top)SARS-COV-2 Vaccination 06/08/2019, 06/29/2019, 01/17/2020, 08/12/2020, 03/10/2021   Pneumococcal Conjugate-13 01/29/2013   Pneumococcal Polysaccharide-23 06/02/2008, 01/18/2011, 09/19/2021   Td 06/02/2008   Health Maintenance Due  Topic Date Due   Zoster Vaccines- Shingrix (1 of 2) Never done   DTaP/Tdap/Td (2 - Tdap) 06/02/2018   DEXA SCAN  Never done   OPHTHALMOLOGY EXAM  06/06/2019   HEMOGLOBIN A1C  03/21/2022   Diabetic kidney evaluation - eGFR measurement  09/20/2022   Diabetic kidney evaluation - Urine ACR  09/20/2022   COVID-19 Vaccine (7 - 2023-24 season) 12/15/2022      Past Medical History:  Diagnosis Date   Anemia    hx of    BACK PAIN  04/13/2010   BACK STRAIN, LUMBAR 04/13/2010   Cancer (HCC) 2017   rectal CA   DIABETES MELLITUS, TYPE II 06/02/2008   on meds   History of blood transfusion    History of chemotherapy    History of radiation therapy    Hx of transfusion of whole blood 09/2015   prior to colon surgery - [facility]   HYPERLIPIDEMIA 06/02/2008   on meds   HYPERTENSION 06/02/2008   on meds   Past Surgical History:  Procedure Laterality Date   COLONOSCOPY Left 09/24/2015   Procedure: COLONOSCOPY;  Godley: Charna Elizabeth, MD;  Location: [facility] ENDOSCOPY;  Service: Endoscopy;  Laterality: Left;   COLONOSCOPY  09/2015   Dr Arbie Cookey CA -suprep(exc)MAC-TA   ESOPHAGOGASTRODUODENOSCOPY N/A 09/24/2015   Procedure: ESOPHAGOGASTRODUODENOSCOPY (EGD);  Haugan: Charna Elizabeth, MD;  Location[facility] ENDOSCOPY;  Service: Endoscopy;  Laterality: N/A;   EUS N/A 10/05/2015   Procedure: LOWER ENDOSCOPIC ULTRASOUND (EUS);  Flye: Rachael Fee, MD;  Location: [facility] ENDOSCOPY;  Service: Endoscopy;  Laterality: N/A;   s/p breast biopsy Left 07/2005   benign   TUBAL LIGATION      reports that she has never smoked. She has never used smokeless tobacco. She reports current alcohol use. She reports that she does not use drugs. family history includes Diabetes in her brother, father,  mother, sister, and another family member; Heart disease in her father; Hypertension in her father. No Known Allergies Current Outpatient Medications on File Prior to Visit  Medication Sig Dispense Refill   amLODipine (NORVASC) 10 MG tablet TAKE 1 TABLET BY MOUTH EVERY DAY 90 tablet 3   atenolol (TENORMIN) 50 MG tablet TAKE 1 TABLET BY MOUTH EVERY DAY 30 tablet 0   atorvastatin (LIPITOR) 20 MG tablet TAKE 1 TABLET BY MOUTH EVERY DAY 30 tablet 0   Blood Glucose Monitoring Suppl (ONE TOUCH ULTRA 2) w/Device KIT Use as directed daily 1 each 0   cyanocobalamin ([employer] VITAMIN B12) 1000 MCG tablet Take 1 tablet (1,000 mcg total) by mouth daily. Annual appt is due must see  provider for future refills 30 tablet 0   glipiZIDE (GLUCOTROL XL) 5 MG 24 hr tablet TAKE 2 TABLETS BY MOUTH EVERY DAY 60 tablet 0   glucose blood (ONE TOUCH ULTRA TEST) test strip USE AS INSTRUCTED ONCE DAILY E11.9 100 each 12   hydrochlorothiazide (HYDRODIURIL) 25 MG tablet TAKE 1 TABLET (25 MG TOTAL) BY MOUTH DAILY. MUST KEEP SCHEDULED APPT FOR FUTURE REFILLS 30 tablet 0   ibuprofen (ADVIL,MOTRIN) 200 MG tablet Take 400 mg by mouth every 6 (six) hours as needed for mild pain.     KLOR-CON M20 20 MEQ tablet TAKE 2 TABLETS BY MOUTH DAILY 180 tablet 3   Lancets Misc. MISC Use as directed once daily 100 each 11   metFORMIN (GLUCOPHAGE) 1000 MG tablet TAKE 1 TABLET (1,000 MG TOTAL) BY MOUTH TWICE A DAY WITH FOOD 60 tablet 0   pioglitazone (ACTOS) 45 MG tablet TAKE 1 TABLET BY MOUTH EVERY DAY 30 tablet 0   sitaGLIPtin (JANUVIA) 100 MG tablet Take 1 tablet (100 mg total) by mouth daily. 30 tablet 5   telmisartan (MICARDIS) 80 MG tablet TAKE 1 TABLET BY MOUTH EVERY DAY 30 tablet 0   [DISCONTINUED] nebivolol (BYSTOLIC) 10 MG tablet Take 1 tablet (10 mg total) by mouth daily. 90 tablet 3   [DISCONTINUED] valsartan (DIOVAN) 320 MG tablet Take 1 tablet (320 mg total) by mouth daily. 90 tablet 3   No current facility-administered medications on file prior to visit.        ROS:  All others reviewed and negative.  Objective        PE:  BP 130/82 (BP Location: Right Arm, Patient Position: Sitting, Cuff Size: Normal)   Pulse 94   Temp 98.2 F (36.8 C) (Oral)   Ht 5\' 5"  (1.651 m)   Wt 175 lb (79.4 kg)   SpO2 96%   BMI 29.12 kg/m                 Constitutional: Pt appears in NAD               HENT: Head: NCAT.                Right Ear: External ear normal.                 Left Ear: External ear normal.                Eyes: . Pupils are equal, round, and reactive to light. Conjunctivae and EOM are normal               Nose: without d/c or deformity               Neck: Neck supple. Gross normal  ROM  Cardiovascular: Normal rate and regular rhythm.                 Pulmonary/Chest: Effort normal and breath sounds without rales or wheezing.                Abd:  Soft, NT, ND, + BS, no organomegaly               Neurological: Pt is alert. At baseline orientation, motor grossly intact               Skin: Skin is warm. No rashes, no other new lesions, LE edema -trace left pedal               Psychiatric: Pt behavior is normal without agitation   Micro: none  Cardiac tracings I have personally interpreted today:  none  Pertinent Radiological findings (summarize): none   Lab Results  Component Value Date   WBC 3.6 (L) 09/19/2021   HGB 11.8 (L) 09/19/2021   HCT 36.7 09/19/2021   PLT 251.0 09/19/2021   GLUCOSE 126 (H) 09/19/2021   CHOL 176 09/19/2021   TRIG 108.0 09/19/2021   HDL 56.50 09/19/2021   LDLCALC 98 09/19/2021   ALT 11 09/19/2021   AST 13 09/19/2021   NA 140 09/19/2021   K 4.1 09/19/2021   CL 102 09/19/2021   CREATININE 0.56 09/19/2021   BUN 15 09/19/2021   CO2 27 09/19/2021   TSH 1.61 09/19/2021   HGBA1C 7.8 (H) 09/19/2021   MICROALBUR 5.2 (H) 09/19/2021   Assessment/Plan:  Karen Good is a 67 y.o. Other or two or more races [6] female with  has a past medical history of Anemia, BACK PAIN (04/13/2010), BACK STRAIN, LUMBAR (04/13/2010), Cancer (HCC) (2017), DIABETES MELLITUS, TYPE II (06/02/2008), History of blood transfusion, History of chemotherapy, History of radiation therapy, transfusion of whole blood (09/2015), HYPERLIPIDEMIA (06/02/2008), and HYPERTENSION (06/02/2008).  Encounter for well adult exam with abnormal findings Age and sex appropriate education and counseling updated with regular exercise and diet Referrals for preventative services - pt to call for optho soon, declines dxa Immunizations addressed - for shingrix and tdap at pharmacy, for flu shot today Smoking counseling  - none needed Evidence for depression or other mood  disorder - none significant Most recent labs reviewed. I have personally reviewed and have noted: 1) the patient's medical and social history 2) The patient's current medications and supplements 3) The patient's height, weight, and BMI have been recorded in the chart   Diabetes [facility]) Lab Results  Component Value Date   HGBA1C 7.8 (H) 09/19/2021   uncontrolled, pt to continue current medical treatment glipizide xl 10 every day, metofrmin 1000 bid, actos 45 every day, januvia 25 every day, and pt will call if wants to start ozempic   Essential hypertension BP Readings from Last 3 Encounters:  12/31/22 130/82  09/19/21 (!) 142/76  09/04/20 (!) 153/78   Stable, pt to continue medical treatment norvasc 10 every day, tenormin 50 every day, hct 25 every day, micardis 80 qd   Hyperlipidemia Lab Results  Component Value Date   LDLCALC 98 09/19/2021   uncontrolled, pt to continue current statin lipitor 20 every day and f/u lab today  Followup: Return in about 6 months (around 06/30/2023).  Oliver Barre, MD 12/31/2022 8:23 PM [facility] Internal Medicine

## 2022-12-31 NOTE — Assessment & Plan Note (Signed)
Lab Results  Component Value Date   LDLCALC 98 09/19/2021   uncontrolled, pt to continue current statin lipitor 20 every day and f/u lab today

## 2022-12-31 NOTE — Patient Instructions (Addendum)
Please have your Shingrix (shingles) shots done at your local pharmacy., and tdap tetanus shot today  Please remember to call for your yearly eye exam  Please call if you would want to take the ozempic or mounjaro  Please continue all other medications as before, and refills have been done if requested.  Please have the pharmacy call with any other refills you may need.  Please continue your efforts at being more active, low cholesterol diet, and weight control.  You are otherwise up to date with prevention measures today.  Please keep your appointments with your specialists as you may have planned  Please go to the LAB at the blood drawing area for the tests to be done  You will be contacted by phone if any changes need to be made immediately.  Otherwise, you will receive a letter about your results with an explanation, but please check with MyChart first.  Please make an Appointment to return in 6 months, or sooner if needed

## 2023-01-21 ENCOUNTER — Other Ambulatory Visit: Payer: Self-pay

## 2023-01-21 ENCOUNTER — Other Ambulatory Visit: Payer: Self-pay | Admitting: Internal Medicine

## 2023-02-27 ENCOUNTER — Other Ambulatory Visit: Payer: Self-pay | Admitting: Internal Medicine

## 2023-02-27 ENCOUNTER — Other Ambulatory Visit: Payer: Self-pay

## 2023-05-05 ENCOUNTER — Other Ambulatory Visit: Payer: Self-pay | Admitting: Internal Medicine

## 2023-05-05 ENCOUNTER — Other Ambulatory Visit: Payer: Self-pay

## 2023-05-06 ENCOUNTER — Telehealth: Payer: Self-pay | Admitting: Internal Medicine

## 2023-05-06 DIAGNOSIS — E1165 Type 2 diabetes mellitus with hyperglycemia: Secondary | ICD-10-CM

## 2023-05-06 NOTE — Telephone Encounter (Signed)
Copied from CRM [id number]. Topic: Clinical - Prescription Issue >> May 06, 2023  9:32 AM Pascal Lux wrote: Reason for CRM: Patient stated that she is in the [facility] and they said to call their pharmacy [phone]  before 3:30 to provide them with a new prescription for the patient's medication [ sitaGLIPtin (JANUVIA) 100 MG tablet [[id number]] -

## 2023-05-09 NOTE — Telephone Encounter (Signed)
Patient is calling to request refill for Januvia 100 MG tablet be sent to the [facility], Pharmacy contact is [phone]. Patient has 5 tablets left and does not want to run out. This is patients 2nd request.

## 2023-05-09 NOTE — Telephone Encounter (Signed)
I dont personally handle patient assist programs - but we can refer to [facility] pharmacist who helps with this

## 2023-05-12 MED ORDER — SITAGLIPTIN PHOSPHATE 100 MG PO TABS: 100.0000 mg | ORAL_TABLET | Freq: Every day | ORAL | 3 refills | Status: DC

## 2023-05-12 NOTE — Telephone Encounter (Signed)
Sending Januvia Rx to [facility] for PAP  Arbutus Leas, PharmD, BCPS, CPP Clinical Pharmacist Practitioner [facility] [phone]

## 2023-05-18 ENCOUNTER — Encounter: Payer: Self-pay | Admitting: Hematology

## 2023-06-02 ENCOUNTER — Other Ambulatory Visit: Payer: Self-pay

## 2023-06-02 ENCOUNTER — Other Ambulatory Visit: Payer: Self-pay | Admitting: Internal Medicine

## 2023-06-10 ENCOUNTER — Encounter: Payer: Self-pay | Admitting: Hematology

## 2023-06-17 ENCOUNTER — Ambulatory Visit (INDEPENDENT_AMBULATORY_CARE_PROVIDER_SITE_OTHER): Payer: Medicare HMO

## 2023-06-17 VITALS — Ht 65.0 in | Wt 175.0 lb

## 2023-06-17 DIAGNOSIS — Z0000 Encounter for general adult medical examination without abnormal findings: Secondary | ICD-10-CM | POA: Diagnosis not present

## 2023-06-17 NOTE — Patient Instructions (Signed)
 Ms. Scroggin , Thank you for taking time to come for your Medicare Wellness Visit. I appreciate your ongoing commitment to your health goals. Please review the following plan we discussed and let me know if I can assist you in the future.   Referrals/Orders/Follow-Ups/Clinician Recommendations: It was nice talking with you today.  You are due for a Tetanus and Shingles vaccine.  Remember to get scheduled for your mammogram screening and a bone density screening.  Aim for 30 minutes of exercise or brisk walking, 6-8 glasses of water, and 5 servings of fruits and vegetables each day.   This is a list of the screening recommended for you and due dates:  Health Maintenance  Topic Date Due   Zoster (Shingles) Vaccine (1 of 2) Never done   DTaP/Tdap/Td vaccine (2 - Tdap) 06/02/2018   DEXA scan (bone density measurement)  Never done   Eye exam for diabetics  06/06/2019   COVID-19 Vaccine (7 - 2024-25 season) 12/15/2022   Mammogram  06/18/2023*   Hemoglobin A1C  06/30/2023   Yearly kidney function blood test for diabetes  12/31/2023   Yearly kidney health urinalysis for diabetes  12/31/2023   Complete foot exam   12/31/2023   Medicare Annual Wellness Visit  06/16/2024   Colon Cancer Screening  09/01/2025   Pneumonia Vaccine  Completed   Flu Shot  Completed   Hepatitis C Screening  Completed   HPV Vaccine  Aged Out  *Topic was postponed. The date shown is not the original due date.    Advanced directives: (Declined) Advance directive discussed with you today. Even though you declined this today, please call our office should you change your mind, and we can give you the proper paperwork for you to fill out.  Next Medicare Annual Wellness Visit scheduled for next year: Yes

## 2023-06-17 NOTE — Progress Notes (Signed)
 Subjective:   Karen Good is a 68 y.o. who presents for a Medicare Wellness preventive visit.  Visit Complete: Virtual I connected with  Karen Good on 06/17/23 by a video and audio enabled telemedicine application and verified that I am speaking with the correct person using two identifiers.  Patient Location: Home  Provider Location: Home Office  I discussed the limitations of evaluation and management by telemedicine. The patient expressed understanding and agreed to proceed.  Vital Signs: Because this visit was a virtual/telehealth visit, some criteria may be missing or patient reported. Any vitals not documented were not able to be obtained and vitals that have been documented are patient reported.   AWV Questionnaire: No: Patient Medicare AWV questionnaire was not completed prior to this visit.  Cardiac Risk Factors include: advanced age (>58men, >64 women);hypertension;female gender;Other (see comment);diabetes mellitus;dyslipidemia, Risk factor comments: Retal cancer     Objective:    Today's Vitals   06/17/23 1131  Weight: 175 lb (79.4 kg)  Height: 5\' 5"  (1.651 m)   Body mass index is 29.12 kg/m.     06/17/2023   11:36 AM 06/18/2022    4:40 PM 05/23/2017   10:32 AM 04/25/2017    3:21 PM 06/28/2016    3:07 PM 04/22/2016    3:14 PM 03/28/2016    3:20 PM  Advanced Directives  Does Patient Have a Medical Advance Directive? No No No No No No No  Would patient like information on creating a medical advance directive?  Yes (MAU/Ambulatory/Procedural Areas - Information given)    No - Patient declined     Current Medications (verified) Outpatient Encounter Medications as of 06/17/2023  Medication Sig   amLODipine (NORVASC) 10 MG tablet TAKE 1 TABLET BY MOUTH EVERY DAY   atenolol (TENORMIN) 50 MG tablet TAKE 1 TABLET BY MOUTH EVERY DAY   atorvastatin (LIPITOR) 20 MG tablet TAKE 1 TABLET BY MOUTH EVERY DAY   Blood Glucose Monitoring Suppl (ONE TOUCH ULTRA 2) w/Device  KIT Use as directed daily   cyanocobalamin ([employer] VITAMIN B12) 1000 MCG tablet Take 1 tablet (1,000 mcg total) by mouth daily. Annual appt is due must see provider for future refills   glipiZIDE (GLUCOTROL XL) 5 MG 24 hr tablet TAKE 2 TABLETS BY MOUTH EVERY DAY   glucose blood (ONE TOUCH ULTRA TEST) test strip USE AS INSTRUCTED ONCE DAILY E11.9   hydrochlorothiazide (HYDRODIURIL) 25 MG tablet TAKE 1 TABLET (25 MG TOTAL) BY MOUTH DAILY. MUST KEEP SCHEDULED APPT FOR FUTURE REFILLS   ibuprofen (ADVIL,MOTRIN) 200 MG tablet Take 400 mg by mouth every 6 (six) hours as needed for mild pain.   KLOR-CON M20 20 MEQ tablet TAKE 2 TABLETS BY MOUTH DAILY   Lancets Misc. MISC Use as directed once daily   metFORMIN (GLUCOPHAGE) 1000 MG tablet TAKE 1 TABLET (1,000 MG TOTAL) BY MOUTH TWICE A DAY WITH FOOD   pioglitazone (ACTOS) 45 MG tablet TAKE 1 TABLET BY MOUTH EVERY DAY   sitaGLIPtin (JANUVIA) 100 MG tablet Take 1 tablet (100 mg total) by mouth daily.   telmisartan (MICARDIS) 80 MG tablet TAKE 1 TABLET BY MOUTH EVERY DAY   [DISCONTINUED] nebivolol (BYSTOLIC) 10 MG tablet Take 1 tablet (10 mg total) by mouth daily.   [DISCONTINUED] valsartan (DIOVAN) 320 MG tablet Take 1 tablet (320 mg total) by mouth daily.   No facility-administered encounter medications on file as of 06/17/2023.    Allergies (verified) Patient has no known allergies.   History: Past  Medical History:  Diagnosis Date   Anemia    hx of    BACK PAIN 04/13/2010   BACK STRAIN, LUMBAR 04/13/2010   Cancer (HCC) 2017   rectal CA   DIABETES MELLITUS, TYPE II 06/02/2008   on meds   History of blood transfusion    History of chemotherapy    History of radiation therapy    Hx of transfusion of whole blood 09/2015   prior to colon surgery - [facility]   HYPERLIPIDEMIA 06/02/2008   on meds   HYPERTENSION 06/02/2008   on meds   Past Surgical History:  Procedure Laterality Date   COLONOSCOPY Left 09/24/2015   Procedure: COLONOSCOPY;  [place]: Charna Elizabeth, MD;  Location: [facility] ENDOSCOPY;  Service: Endoscopy;  Laterality: Left;   COLONOSCOPY  09/2015   Dr Arbie Cookey [place] -suprep(exc)MAC-TA   ESOPHAGOGASTRODUODENOSCOPY N/A 09/24/2015   Procedure: ESOPHAGOGASTRODUODENOSCOPY (EGD);  [place]: Charna Elizabeth, MD;  Location: [facility] ENDOSCOPY;  Service: Endoscopy;  Laterality: N/A;   EUS N/A 10/05/2015   Procedure: LOWER ENDOSCOPIC ULTRASOUND (EUS);  [place]: Rachael Fee, MD;  Location: [facility] ENDOSCOPY;  Service: Endoscopy;  Laterality: N/A;   s/p breast biopsy Left 07/2005   benign   TUBAL LIGATION     Family History  Problem Relation Age of Onset   Diabetes Mother    Heart disease Father    Diabetes Father    Hypertension Father    Diabetes Sister    Diabetes Other    Diabetes Brother    Colon cancer Neg Hx    Rectal cancer Neg Hx    Stomach cancer Neg Hx    Colon polyps Neg Hx    Esophageal cancer Neg Hx    Social History   Socioeconomic History   Marital status: Single    Spouse name: Not on file   Number of children: 2   Years of education: Not on file   Highest education level: Not on file  Occupational History    Employer: [employer]   Occupation: RETIRED  Tobacco Use   Smoking status: Never   Smokeless tobacco: Never  Vaping Use   Vaping status: Never Used  Substance and Sexual Activity   Alcohol use: Yes    Comment: socially   Drug use: No   Sexual activity: Yes    Birth control/protection: Post-menopausal  Other Topics Concern   Not on file  Social History Narrative   Lives alone/2025   Social Drivers of Health   Financial Resource Strain: Low Risk  (06/17/2023)   Overall Financial Resource Strain (CARDIA)    Difficulty of Paying Living Expenses: Not very hard  Food Insecurity: No Food Insecurity (06/17/2023)   Hunger Vital Sign    Worried About Running Out of Food in the Last Year: Never true    Ran Out of Food in the Last Year: Never true  Transportation Needs: No Transportation Needs (06/17/2023)   PRAPARE -  Administrator, Civil Service (Medical): No    Lack of Transportation (Non-Medical): No  Physical Activity: Inactive (06/17/2023)   Exercise Vital Sign    Days of Exercise per Week: 0 days    Minutes of Exercise per Session: 0 min  Stress: No Stress Concern Present (06/17/2023)   [employer] of Occupational Health - Occupational Stress Questionnaire    Feeling of Stress : Not at all  Social Connections: Moderately Integrated (06/17/2023)   Social Connection and Isolation Panel [NHANES]    Frequency of Communication  with Friends and Family: More than three times a week    Frequency of Social Gatherings with Friends and Family: Once a week    Attends Religious Services: More than 4 times per year    Active Member of [employer] or Organizations: Yes    Attends Banker Meetings: 1 to 4 times per year    Marital Status: Widowed    Tobacco Counseling Counseling given: Not Answered    Clinical Intake:  Pre-visit preparation completed: Yes  Pain : No/denies pain     BMI - recorded: 29.12 Nutritional Status: BMI 25 -29 Overweight Nutritional Risks: None Diabetes: Yes CBG done?: No Did pt. bring in CBG monitor from home?: No  How often do you need to have someone help you when you read instructions, pamphlets, or other written materials from your doctor or pharmacy?: 1 - Never  Interpreter Needed?: No  Information entered by :: Kenzey Birkland, RMA  Activities of Daily Living     06/17/2023   11:31 AM 06/18/2022    4:40 PM  In your present state of health, do you have any difficulty performing the following activities:  Hearing? 0 0  Vision? 0 0  Difficulty concentrating or making decisions? 0 0  Walking or climbing stairs? 0 0  Dressing or bathing? 0 0  Doing errands, shopping? 0 0  Preparing Food and eating ? N N  Using the Toilet? N N  In the past six months, have you accidently leaked urine? N N  Do you have problems with loss of bowel control? N N   Managing your Medications? N N  Managing your Finances? N N  Housekeeping or managing your Housekeeping? N N    Patient Care Team: Corwin Levins, MD as PCP - Veleta Miners, MD as Consulting Physician (Radiation Oncology) Romie Levee, MD as Consulting Physician (General Surgery) Rachael Fee, MD (Inactive) as Attending Physician (Gastroenterology) Wandalee Ferdinand, RN as Registered Nurse Pa, [facility] Ophthalmology  Indicate any recent Medical Services you may have received from other than [facility] providers in the past year (date may be approximate).     Assessment:   This is a routine wellness examination for Karen Good.  Hearing/Vision screen Hearing Screening - Comments:: Denies hearing difficulties   Vision Screening - Comments:: Wears eyeglasses   Goals Addressed             This Visit's Progress    Remain active and indpendent   On track      Depression Screen     06/17/2023   11:41 AM 12/31/2022    2:45 PM 06/18/2022    4:38 PM 09/19/2021    2:46 PM 09/19/2021    2:31 PM 08/14/2020    3:14 PM 08/05/2019    2:05 PM  PHQ 2/9 Scores  PHQ - 2 Score 0 0 0 0 0 0 0  PHQ- 9 Score 0    0      Fall Risk     06/17/2023   11:36 AM 12/31/2022    2:44 PM 06/18/2022    4:37 PM 09/19/2021    2:46 PM 09/19/2021    2:31 PM  Fall Risk   Falls in the past year? 0 0 0 0 0  Number falls in past yr: 0 0 0 0 0  Injury with Fall? 0 0 0 0 0  Risk for fall due to : No Fall Risks No Fall Risks No Fall Risks    Follow up  Falls prevention discussed;Falls evaluation completed Falls evaluation completed Falls prevention discussed;Education provided;Falls evaluation completed      MEDICARE RISK AT HOME:  Medicare Risk at Home Any stairs in or around the home?: Yes If so, are there any without handrails?: Yes Home free of loose throw rugs in walkways, pet beds, electrical cords, etc?: Yes Adequate lighting in your home to reduce risk of falls?: Yes Life alert?: No Use of a cane, walker or  w/c?: No Grab bars in the bathroom?: Yes Shower chair or bench in shower?: No Elevated toilet seat or a handicapped toilet?: No  TIMED UP AND GO:  Was the test performed?  No  Cognitive Function: 6CIT completed        06/17/2023   11:37 AM 06/18/2022    4:40 PM  6CIT Screen  What Year? 0 points 0 points  What month? 0 points 0 points  What time? 0 points 0 points  Count back from 20 0 points 0 points  Months in reverse 0 points 0 points  Repeat phrase 0 points 0 points  Total Score 0 points 0 points    Immunizations Immunization History  Administered Date(s) Administered   Fluad Trivalent(High Dose 65+) 12/31/2022   Influenza Whole 01/14/2008   Influenza, High Dose Seasonal PF 01/11/2019   Influenza-Unspecified 01/13/2017, 01/06/2018, 01/17/2020   PFIZER Comirnaty(Gray Top)Covid-19 Tri-Sucrose Vaccine 08/12/2020   PFIZER(Purple Top)SARS-COV-2 Vaccination 06/08/2019, 06/29/2019, 01/17/2020, 08/12/2020, 03/10/2021   Pneumococcal Conjugate-13 01/29/2013   Pneumococcal Polysaccharide-23 06/02/2008, 01/18/2011, 09/19/2021   Td 06/02/2008    Screening Tests Health Maintenance  Topic Date Due   Zoster Vaccines- Shingrix (1 of 2) Never done   DTaP/Tdap/Td (2 - Tdap) 06/02/2018   DEXA SCAN  Never done   OPHTHALMOLOGY EXAM  06/06/2019   COVID-19 Vaccine (7 - 2024-25 season) 12/15/2022   MAMMOGRAM  06/18/2023 (Originally 03/06/2020)   HEMOGLOBIN A1C  06/30/2023   Diabetic kidney evaluation - eGFR measurement  12/31/2023   Diabetic kidney evaluation - Urine ACR  12/31/2023   FOOT EXAM  12/31/2023   Medicare Annual Wellness (AWV)  06/16/2024   Colonoscopy  09/01/2025   Pneumonia Vaccine 72+ Years old  Completed   INFLUENZA VACCINE  Completed   Hepatitis C Screening  Completed   HPV VACCINES  Aged Out    Health Maintenance  Health Maintenance Due  Topic Date Due   Zoster Vaccines- Shingrix (1 of 2) Never done   DTaP/Tdap/Td (2 - Tdap) 06/02/2018   DEXA SCAN  Never  done   OPHTHALMOLOGY EXAM  06/06/2019   COVID-19 Vaccine (7 - 2024-25 season) 12/15/2022   Health Maintenance Items Addressed: See Nurse Notes  Additional Screening:  Vision Screening: Recommended annual ophthalmology exams for early detection of glaucoma and other disorders of the eye.  Dental Screening: Recommended annual dental exams for proper oral hygiene  Community Resource Referral / Chronic Care Management: CRR required this visit?  No   CCM required this visit?  No     Plan:     I have personally reviewed and noted the following in the patient's chart:   Medical and social history Use of alcohol, tobacco or illicit drugs  Current medications and supplements including opioid prescriptions. Patient is not currently taking opioid prescriptions. Functional ability and status Nutritional status Physical activity Advanced directives List of other physicians Hospitalizations, surgeries, and ER visits in previous 12 months Vitals Screenings to include cognitive, depression, and falls Referrals and appointments  In addition, I have reviewed and discussed  with patient certain preventive protocols, quality metrics, and best practice recommendations. A written personalized care plan for preventive services as well as general preventive health recommendations were provided to patient.     Jon Kasparek L Seiya Silsby, CMA   06/17/2023   After Visit Summary: (MyChart) Due to this being a telephonic visit, the after visit summary with patients personalized plan was offered to patient via MyChart   Notes: Please refer to Routing Comments.

## 2023-07-02 ENCOUNTER — Telehealth: Payer: Self-pay

## 2023-07-02 ENCOUNTER — Encounter: Payer: Self-pay | Admitting: Internal Medicine

## 2023-07-02 ENCOUNTER — Ambulatory Visit (INDEPENDENT_AMBULATORY_CARE_PROVIDER_SITE_OTHER): Payer: Medicare HMO | Admitting: Internal Medicine

## 2023-07-02 ENCOUNTER — Encounter: Payer: Self-pay | Admitting: Hematology

## 2023-07-02 ENCOUNTER — Other Ambulatory Visit (HOSPITAL_COMMUNITY): Payer: Self-pay

## 2023-07-02 VITALS — BP 140/80 | HR 80 | Temp 98.5°F | Ht 65.0 in | Wt 177.0 lb

## 2023-07-02 DIAGNOSIS — E1165 Type 2 diabetes mellitus with hyperglycemia: Secondary | ICD-10-CM

## 2023-07-02 DIAGNOSIS — E2839 Other primary ovarian failure: Secondary | ICD-10-CM | POA: Diagnosis not present

## 2023-07-02 DIAGNOSIS — I10 Essential (primary) hypertension: Secondary | ICD-10-CM | POA: Diagnosis not present

## 2023-07-02 DIAGNOSIS — Z7985 Long-term (current) use of injectable non-insulin antidiabetic drugs: Secondary | ICD-10-CM | POA: Diagnosis not present

## 2023-07-02 DIAGNOSIS — E7800 Pure hypercholesterolemia, unspecified: Secondary | ICD-10-CM | POA: Diagnosis not present

## 2023-07-02 LAB — HEPATIC FUNCTION PANEL
ALT: 12 U/L (ref 0–35)
AST: 14 U/L (ref 0–37)
Albumin: 4.7 g/dL (ref 3.5–5.2)
Alkaline Phosphatase: 70 U/L (ref 39–117)
Bilirubin, Direct: 0.1 mg/dL (ref 0.0–0.3)
Total Bilirubin: 0.9 mg/dL (ref 0.2–1.2)
Total Protein: 7.7 g/dL (ref 6.0–8.3)

## 2023-07-02 LAB — LIPID PANEL
Cholesterol: 177 mg/dL (ref 0–200)
HDL: 64.3 mg/dL (ref >39.00)
LDL Cholesterol: 92 mg/dL (ref 0–99)
NonHDL: 112.41
Total CHOL/HDL Ratio: 3
Triglycerides: 104 mg/dL (ref 0.0–149.0)
VLDL: 20.8 mg/dL (ref 0.0–40.0)

## 2023-07-02 LAB — BASIC METABOLIC PANEL
BUN: 14 mg/dL (ref 6–23)
CO2: 29 meq/L (ref 19–32)
Calcium: 10.4 mg/dL (ref 8.4–10.5)
Chloride: 100 meq/L (ref 96–112)
Creatinine, Ser: 0.57 mg/dL (ref 0.40–1.20)
GFR: 92.51 mL/min (ref >60.00)
Glucose, Bld: 139 mg/dL — ABNORMAL HIGH (ref 70–99)
Potassium: 4.8 meq/L (ref 3.5–5.1)
Sodium: 138 meq/L (ref 135–145)

## 2023-07-02 LAB — HEMOGLOBIN A1C: Hgb A1c MFr Bld: 8.8 % — ABNORMAL HIGH (ref 4.6–6.5)

## 2023-07-02 MED ORDER — OZEMPIC (0.25 OR 0.5 MG/DOSE) 2 MG/3ML ~~LOC~~ SOPN: PEN_INJECTOR | SUBCUTANEOUS | 11 refills | Status: DC

## 2023-07-02 NOTE — Progress Notes (Signed)
 Patient ID: Karen Good, female   DOB: Aug 09, 1955, 68 y.o.   MRN: [id number]        Chief Complaint: follow up dm, post menopausal, htn, hld       HPI:  Karen Good is a 68 y.o. female here overall doing ok, hard to lose wt,  Pt denies chest pain, increased sob or doe, wheezing, orthopnea, PND, [name], palpitations, dizziness or syncope.   Pt denies polydipsia, polyuria, [name], wt loss, night sweats, loss of appetite, or other constitutional symptoms  Due for DXA.    Wt Readings from Last 3 Encounters:  07/02/23 177 lb (80.3 kg)  06/17/23 175 lb (79.4 kg)  12/31/22 175 lb (79.4 kg)   BP Readings from Last 3 Encounters:  07/02/23 (!) 140/80  12/31/22 130/82  09/19/21 (!) 142/76         Past Medical History:  Diagnosis Date   Anemia    hx of    BACK PAIN 04/13/2010   BACK STRAIN, LUMBAR 04/13/2010   Cancer (HCC) 2017   rectal CA   DIABETES MELLITUS, TYPE II 06/02/2008   on meds   History of blood transfusion    History of chemotherapy    History of radiation therapy    Hx of transfusion of whole blood 09/2015   prior to colon surgery - [facility]   HYPERLIPIDEMIA 06/02/2008   on meds   HYPERTENSION 06/02/2008   on meds   Past Surgical History:  Procedure Laterality Date   COLONOSCOPY Left 09/24/2015   Procedure: COLONOSCOPY;  [place]: Charna Elizabeth, MD;  Location: [facility] ENDOSCOPY;  Service: Endoscopy;  Laterality: Left;   COLONOSCOPY  09/2015   Dr Arbie Cookey [place] -suprep(exc)MAC-TA   ESOPHAGOGASTRODUODENOSCOPY N/A 09/24/2015   Procedure: ESOPHAGOGASTRODUODENOSCOPY (EGD);  [place]: Charna Elizabeth, MD;  Location: [facility] ENDOSCOPY;  Service: Endoscopy;  Laterality: N/A;   EUS N/A 10/05/2015   Procedure: LOWER ENDOSCOPIC ULTRASOUND (EUS);  [place]: Rachael Fee, MD;  Location: [facility] ENDOSCOPY;  Service: Endoscopy;  Laterality: N/A;   s/p breast biopsy Left 07/2005   benign   TUBAL LIGATION      reports that she has never smoked. She has  never used smokeless tobacco. She reports current alcohol use. She reports that she does not use drugs. family history includes Diabetes in her brother, father, mother, sister, and another family member; Heart disease in her father; Hypertension in her father. No Known Allergies Current Outpatient Medications on File Prior to Visit  Medication Sig Dispense Refill   amLODipine (NORVASC) 10 MG tablet TAKE 1 TABLET BY MOUTH EVERY DAY 90 tablet 3   atenolol (TENORMIN) 50 MG tablet TAKE 1 TABLET BY MOUTH EVERY DAY 90 tablet 3   atorvastatin (LIPITOR) 20 MG tablet TAKE 1 TABLET BY MOUTH EVERY DAY 90 tablet 3   Blood Glucose Monitoring Suppl (ONE TOUCH ULTRA 2) w/Device KIT Use as directed daily 1 each 0   cyanocobalamin ([employer]S VITAMIN B12) 1000 MCG tablet Take 1 tablet (1,000 mcg total) by mouth daily. Annual appt is due must see provider for future refills 30 tablet 0   glipiZIDE (GLUCOTROL XL) 5 MG 24 hr tablet TAKE 2 TABLETS BY MOUTH EVERY DAY 180 tablet 3   glucose blood (ONE TOUCH ULTRA TEST) test strip USE AS INSTRUCTED ONCE DAILY E11.9 100 each 12   hydrochlorothiazide (HYDRODIURIL) 25 MG tablet TAKE 1 TABLET (25 MG TOTAL) BY MOUTH DAILY. MUST KEEP SCHEDULED  APPT FOR FUTURE REFILLS 90 tablet 3   ibuprofen (ADVIL,MOTRIN) 200 MG tablet Take 400 mg by mouth every 6 (six) hours as needed for mild pain.     KLOR-CON M20 20 MEQ tablet TAKE 2 TABLETS BY MOUTH DAILY 180 tablet 3   Lancets Misc. MISC Use as directed once daily 100 each 11   metFORMIN (GLUCOPHAGE) 1000 MG tablet TAKE 1 TABLET (1,000 MG TOTAL) BY MOUTH TWICE A DAY WITH FOOD 180 tablet 1   pioglitazone (ACTOS) 45 MG tablet TAKE 1 TABLET BY MOUTH EVERY DAY 90 tablet 3   sitaGLIPtin (JANUVIA) 100 MG tablet Take 1 tablet (100 mg total) by mouth daily. 90 tablet 3   telmisartan (MICARDIS) 80 MG tablet TAKE 1 TABLET BY MOUTH EVERY DAY 30 tablet 3   [DISCONTINUED] nebivolol (BYSTOLIC) 10 MG tablet Take 1 tablet (10 mg total) by mouth daily. 90  tablet 3   [DISCONTINUED] valsartan (DIOVAN) 320 MG tablet Take 1 tablet (320 mg total) by mouth daily. 90 tablet 3   No current facility-administered medications on file prior to visit.        ROS:  All others reviewed and negative.  Objective        PE:  BP (!) 140/80 (BP Location: Left Arm, Patient Position: Sitting, Cuff Size: Normal)   Pulse 80   Temp 98.5 F (36.9 C) (Oral)   Ht 5\' 5"  (1.651 m)   Wt 177 lb (80.3 kg)   SpO2 97%   BMI 29.45 kg/m                 Constitutional: Pt appears in NAD               HENT: Head: NCAT.                Right Ear: External ear normal.                 Left Ear: External ear normal.                Eyes: . Pupils are equal, round, and reactive to light. Conjunctivae and EOM are normal               Nose: without d/c or deformity               Neck: Neck supple. Gross normal ROM               Cardiovascular: Normal rate and regular rhythm.                 Pulmonary/Chest: Effort normal and breath sounds without rales or wheezing.                Abd:  Soft, NT, ND, + BS, no organomegaly               Neurological: Pt is alert. At baseline orientation, motor grossly intact               Skin: Skin is warm. No rashes, no other new lesions, LE edema - none               Psychiatric: Pt behavior is normal without agitation   Micro: none  Cardiac tracings I have personally interpreted today:  none  Pertinent Radiological findings (summarize): none   Lab Results  Component Value Date   WBC 5.2 12/31/2022   HGB 12.2 12/31/2022   HCT 38.7 12/31/2022   PLT 254.0 12/31/2022  GLUCOSE 139 (H) 07/02/2023   CHOL 177 07/02/2023   TRIG 104.0 07/02/2023   HDL 64.30 07/02/2023   LDLCALC 92 07/02/2023   ALT 12 07/02/2023   AST 14 07/02/2023   NA 138 07/02/2023   K 4.8 07/02/2023   CL 100 07/02/2023   CREATININE 0.57 07/02/2023   BUN 14 07/02/2023   CO2 29 07/02/2023   TSH 2.02 12/31/2022   HGBA1C 8.8 (H) 07/02/2023   MICROALBUR 10.1 (H)  12/31/2022   Assessment/Plan:  Karen Good is a 68 y.o. Other or two or more races [6] female with  has a past medical history of Anemia, BACK PAIN (04/13/2010), BACK STRAIN, LUMBAR (04/13/2010), Cancer (HCC) (2017), DIABETES MELLITUS, TYPE II (06/02/2008), History of blood transfusion, History of chemotherapy, History of radiation therapy, transfusion of whole blood (09/2015), HYPERLIPIDEMIA (06/02/2008), and HYPERTENSION (06/02/2008).  Hyperlipidemia Lab Results  Component Value Date   LDLCALC 92 07/02/2023   Stable, pt to continue current statin lipitor 20 qd   Essential hypertension BP Readings from Last 3 Encounters:  07/02/23 (!) 140/80  12/31/22 130/82  09/19/21 (!) 142/76   Uncontrolled, states has not yet taken her meds today, pt to continue medical treatment norvasc 10 every day, tenormin 50 every day, hct 25 every day, micardis 80 qd   Diabetes (HCC) Lab Results  Component Value Date   HGBA1C 8.8 (H) 07/02/2023   Uncontrolled with obesity, for add ozempic 0.25 weekly with intent to titrate, pt to continue current medical treatment     Estrogen deficiency Also for DXA as she is due post menopausal Followup: Return in about 6 months (around 01/02/2024).  Oliver Barre, MD 07/05/2023 3:21 PM [facility]

## 2023-07-02 NOTE — Patient Instructions (Signed)
 Please schedule the bone density test before leaving today at the scheduling desk (where you check out)  Please take all new medication as prescribed - the ozempic 0.25 mg weekly - and call or mychart message in 1 month if you are doing well, for the next higher dose  Please continue all other medications as before, and refills have been done if requested.  Please have the pharmacy call with any other refills you may need.  Please keep your appointments with your specialists as you may have planned  Please go to the LAB at the blood drawing area for the tests to be done  You will be contacted by phone if any changes need to be made immediately.  Otherwise, you will receive a letter about your results with an explanation, but please check with MyChart first.  Please make an Appointment to return in 6 months, or sooner if needed

## 2023-07-02 NOTE — Telephone Encounter (Signed)
 P/A is currently in (ATTEMPT) as I have attempted to do a P/A Via CMM and was informed no P/A was needed. However a test claim shows different/opposite. When I contacted the pts plan to initiate the P/a verbally I was informed after numerous attempts that ''they're experiencing system issues''  Previous cmm key: (Key: [id number])  I will be following back up on the P/A tomorrow 3.19.25 at  [phone] opt 5

## 2023-07-03 ENCOUNTER — Encounter: Payer: Self-pay | Admitting: Internal Medicine

## 2023-07-04 ENCOUNTER — Encounter: Payer: Self-pay | Admitting: Internal Medicine

## 2023-07-04 ENCOUNTER — Other Ambulatory Visit (HOSPITAL_COMMUNITY): Payer: Self-pay

## 2023-07-04 ENCOUNTER — Ambulatory Visit (INDEPENDENT_AMBULATORY_CARE_PROVIDER_SITE_OTHER)
Admission: RE | Admit: 2023-07-04 | Discharge: 2023-07-04 | Disposition: A | Discharge status: Home / Self Care | Source: Ambulatory Visit | Attending: Internal Medicine | Admitting: Internal Medicine

## 2023-07-04 DIAGNOSIS — E2839 Other primary ovarian failure: Secondary | ICD-10-CM

## 2023-07-04 NOTE — Telephone Encounter (Signed)
 UPDATE:  P/A has now been Approved. Effective 3.21.25 to 3.21.26

## 2023-07-04 NOTE — Telephone Encounter (Signed)
 I Have completed the P/A verbally and the turn around time is 24hrs which would be By (Saturday). If this results in an approval than the pts plan will give her a call over the weekend and inform her that she can now have this rx filled at her pharmacy.    REF# [id number]

## 2023-07-05 ENCOUNTER — Encounter: Payer: Self-pay | Admitting: Internal Medicine

## 2023-07-05 ENCOUNTER — Other Ambulatory Visit: Payer: Self-pay | Admitting: Internal Medicine

## 2023-07-05 DIAGNOSIS — E2839 Other primary ovarian failure: Secondary | ICD-10-CM | POA: Insufficient documentation

## 2023-07-05 NOTE — Assessment & Plan Note (Signed)
 Lab Results  Component Value Date   HGBA1C 8.8 (H) 07/02/2023   Uncontrolled with obesity, for add ozempic 0.25 weekly with intent to titrate, pt to continue current medical treatment

## 2023-07-05 NOTE — Assessment & Plan Note (Signed)
 BP Readings from Last 3 Encounters:  07/02/23 (!) 140/80  12/31/22 130/82  09/19/21 (!) 142/76   Uncontrolled, states has not yet taken her meds today, pt to continue medical treatment norvasc 10 every day, tenormin 50 every day, hct 25 every day, micardis 80 qd

## 2023-07-05 NOTE — Assessment & Plan Note (Signed)
 Also for DXA as she is due post menopausal

## 2023-07-05 NOTE — Assessment & Plan Note (Signed)
 Lab Results  Component Value Date   LDLCALC 92 07/02/2023   Stable, pt to continue current statin lipitor 20 qd

## 2023-07-07 ENCOUNTER — Other Ambulatory Visit: Payer: Self-pay

## 2023-07-07 DIAGNOSIS — Z9889 Other specified postprocedural states: Secondary | ICD-10-CM | POA: Diagnosis not present

## 2023-07-07 DIAGNOSIS — E119 Type 2 diabetes mellitus without complications: Secondary | ICD-10-CM | POA: Diagnosis not present

## 2023-07-07 DIAGNOSIS — H40023 Open angle with borderline findings, high risk, bilateral: Secondary | ICD-10-CM | POA: Diagnosis not present

## 2023-07-07 DIAGNOSIS — H25813 Combined forms of age-related cataract, bilateral: Secondary | ICD-10-CM | POA: Diagnosis not present

## 2023-07-07 LAB — HM DIABETES EYE EXAM

## 2023-07-15 DIAGNOSIS — H40023 Open angle with borderline findings, high risk, bilateral: Secondary | ICD-10-CM | POA: Diagnosis not present

## 2023-07-15 DIAGNOSIS — H40053 Ocular hypertension, bilateral: Secondary | ICD-10-CM | POA: Diagnosis not present

## 2023-07-15 DIAGNOSIS — Z9889 Other specified postprocedural states: Secondary | ICD-10-CM | POA: Diagnosis not present

## 2023-07-18 ENCOUNTER — Other Ambulatory Visit (HOSPITAL_COMMUNITY): Payer: Self-pay

## 2023-07-22 ENCOUNTER — Other Ambulatory Visit: Payer: Self-pay

## 2023-07-22 ENCOUNTER — Other Ambulatory Visit: Payer: Self-pay | Admitting: Internal Medicine

## 2023-07-23 ENCOUNTER — Telehealth: Payer: Self-pay | Admitting: Internal Medicine

## 2023-07-23 NOTE — Telephone Encounter (Signed)
 Patient dropped off document Patient Assistance Application, to be filled out by provider. Patient requested to send it back via Call Patient to pick up within 7-days. Document is located in providers tray at front office.Please advise at Mobile [phone] (mobile)

## 2023-07-24 ENCOUNTER — Other Ambulatory Visit: Payer: Self-pay | Admitting: Internal Medicine

## 2023-07-24 DIAGNOSIS — E1165 Type 2 diabetes mellitus with hyperglycemia: Secondary | ICD-10-CM

## 2023-07-24 NOTE — Telephone Encounter (Signed)
 Placed on provider desk

## 2023-07-25 ENCOUNTER — Other Ambulatory Visit: Payer: Self-pay

## 2023-07-31 NOTE — Telephone Encounter (Signed)
 Called and let Pt know form is ready for pick up.

## 2023-08-11 NOTE — Telephone Encounter (Signed)
 Copied from CRM [id number]. Topic: General - Other >> Aug 11, 2023  3:17 PM Jethro Morrison wrote: Reason for CRM: [facility] WITH [employer] [phone] PATIENT ID #[id number] CALLING IN REGARDS TO THE PATIENT ASSIST PROGRAM FOR OZEMPIC  0.5. SHE STATED THE ALL OF THE APPLICATION WAS NOT SENT OVER. SHE ALSO STATED ON PAGE 9 OZEMPIC  QUANTITY MUST EQUAL 4.

## 2023-08-11 NOTE — Telephone Encounter (Signed)
 We do not have these forms as it was being picked up by the patient.

## 2023-08-14 NOTE — Telephone Encounter (Signed)
 We do not have the forms the Pt picked them up when they were first completed.

## 2023-08-14 NOTE — Telephone Encounter (Signed)
 Copied from CRM [id number]. Topic: General - Other >> Aug 14, 2023  2:03 PM Adonis Hoot wrote: Reason for CRM: Jerica from  [employer] called in stating that  they did not receive all the pages for the patient assistant  program application,they are still missing :page 7 -state license number,page 9 :quantity must equal 4(still stating 3) Fax#:[phone]

## 2023-08-15 NOTE — Telephone Encounter (Signed)
 Copied from CRM [id number]. Topic: General - Other >> Aug 15, 2023 10:19 AM [name] wrote: Reason for CRM: Patient called in regarding patient assistance program , stated that she will bring the forms back to the office, patient also would like for someone to give her a callback regarding this

## 2023-08-18 NOTE — Telephone Encounter (Signed)
 Forms have came thru fax and placed on provider desk.

## 2023-08-21 DIAGNOSIS — Z9889 Other specified postprocedural states: Secondary | ICD-10-CM | POA: Diagnosis not present

## 2023-08-21 DIAGNOSIS — H40023 Open angle with borderline findings, high risk, bilateral: Secondary | ICD-10-CM | POA: Diagnosis not present

## 2023-08-21 DIAGNOSIS — H40053 Ocular hypertension, bilateral: Secondary | ICD-10-CM | POA: Diagnosis not present

## 2023-08-22 NOTE — Telephone Encounter (Signed)
 Forms have been corrected and faxed today.

## 2023-08-22 NOTE — Telephone Encounter (Signed)
 Patient called this morning to follow up on the Patient Assistance form. CRM entries were created on 05/01, 05/02, and 05/06. She stated that the front desk had uploaded the state license on page 7, but for page 9, the quantity needs to be updated to reflect 4 instead of 3. Patient is currently running out of her medication and is requesting a callback regarding this issue. Fax Number: [phone]  Patient Callback Number : [phone]

## 2023-09-05 ENCOUNTER — Telehealth: Payer: Self-pay | Admitting: *Deleted

## 2023-09-05 NOTE — Telephone Encounter (Signed)
 Called patient to inform her that her Ozempoic patient assistance is here in the office. Patient states she will come by the office today to pick up. Medications is in the fridge at the nursing station

## 2023-09-10 ENCOUNTER — Telehealth: Payer: Self-pay | Admitting: Internal Medicine

## 2023-09-10 NOTE — Telephone Encounter (Signed)
 Pt dropped off a form that needs to be completed by Dr. Autry Legions paperwork placed in providers box. Please advise, Thanks

## 2023-09-11 NOTE — Telephone Encounter (Signed)
 Placed on provider desk

## 2023-09-15 NOTE — Telephone Encounter (Signed)
 Called and let Pt know , form is ready for pickup.

## 2023-09-19 DIAGNOSIS — H40053 Ocular hypertension, bilateral: Secondary | ICD-10-CM | POA: Diagnosis not present

## 2023-09-19 DIAGNOSIS — Z9889 Other specified postprocedural states: Secondary | ICD-10-CM | POA: Diagnosis not present

## 2023-09-19 DIAGNOSIS — H40023 Open angle with borderline findings, high risk, bilateral: Secondary | ICD-10-CM | POA: Diagnosis not present

## 2023-09-19 LAB — HM DIABETES EYE EXAM

## 2023-10-22 DIAGNOSIS — H40053 Ocular hypertension, bilateral: Secondary | ICD-10-CM | POA: Diagnosis not present

## 2023-10-22 DIAGNOSIS — H40023 Open angle with borderline findings, high risk, bilateral: Secondary | ICD-10-CM | POA: Diagnosis not present

## 2023-10-22 DIAGNOSIS — Z9889 Other specified postprocedural states: Secondary | ICD-10-CM | POA: Diagnosis not present

## 2023-10-22 LAB — HM DIABETES EYE EXAM

## 2023-11-08 ENCOUNTER — Other Ambulatory Visit: Payer: Self-pay | Admitting: Internal Medicine

## 2023-11-25 DIAGNOSIS — Z9889 Other specified postprocedural states: Secondary | ICD-10-CM | POA: Diagnosis not present

## 2023-11-25 DIAGNOSIS — H40053 Ocular hypertension, bilateral: Secondary | ICD-10-CM | POA: Diagnosis not present

## 2023-11-25 DIAGNOSIS — H40023 Open angle with borderline findings, high risk, bilateral: Secondary | ICD-10-CM | POA: Diagnosis not present

## 2023-11-27 ENCOUNTER — Telehealth: Payer: Self-pay

## 2023-11-27 NOTE — Telephone Encounter (Signed)
 Patient assistance form has been filled out and given to provider. Awaiting signature for it to be faxed off.

## 2023-12-12 NOTE — Telephone Encounter (Unsigned)
 Copied from CRM [id number]. Topic: Clinical - Prescription Issue >> Dec 12, 2023 10:32 AM Robinson H wrote: Reason for CRM: Patient following up on patient assistance form that needed to be completed for her Ozempic  from [place], states they still haven't received form yet. Per notes form was given to provider on 8/14 to be signed off on and faxed, no further notes after that. Please reach out to patient, okay to leave a voicemail, thanks.  Nikka [phone]

## 2023-12-14 ENCOUNTER — Other Ambulatory Visit: Payer: Self-pay | Admitting: Internal Medicine

## 2023-12-14 DIAGNOSIS — E876 Hypokalemia: Secondary | ICD-10-CM

## 2023-12-17 NOTE — Telephone Encounter (Signed)
 Called and spoke with patient. Informed her that the patient assistance form was faxed out 11/28/23 with confirmation. She stated she would reach out to [employer] to make sure that it was received on her side and status of med delivery

## 2023-12-24 ENCOUNTER — Other Ambulatory Visit: Payer: Self-pay | Admitting: Internal Medicine

## 2024-01-02 ENCOUNTER — Ambulatory Visit: Admitting: Internal Medicine

## 2024-02-04 ENCOUNTER — Ambulatory Visit: Admitting: Internal Medicine

## 2024-02-04 VITALS — BP 132/80 | HR 90 | Temp 98.1°F | Ht 65.0 in | Wt 170.4 lb

## 2024-02-04 DIAGNOSIS — Z0000 Encounter for general adult medical examination without abnormal findings: Secondary | ICD-10-CM

## 2024-02-04 DIAGNOSIS — I10 Essential (primary) hypertension: Secondary | ICD-10-CM | POA: Diagnosis not present

## 2024-02-04 DIAGNOSIS — E7800 Pure hypercholesterolemia, unspecified: Secondary | ICD-10-CM

## 2024-02-04 DIAGNOSIS — Z0001 Encounter for general adult medical examination with abnormal findings: Secondary | ICD-10-CM

## 2024-02-04 DIAGNOSIS — Z23 Encounter for immunization: Secondary | ICD-10-CM | POA: Diagnosis not present

## 2024-02-04 DIAGNOSIS — E1165 Type 2 diabetes mellitus with hyperglycemia: Secondary | ICD-10-CM | POA: Diagnosis not present

## 2024-02-04 LAB — BASIC METABOLIC PANEL WITH GFR
BUN: 20 mg/dL (ref 6–23)
CO2: 27 meq/L (ref 19–32)
Calcium: 9.4 mg/dL (ref 8.4–10.5)
Chloride: 102 meq/L (ref 96–112)
Creatinine, Ser: 0.79 mg/dL (ref 0.40–1.20)
GFR: 75.83 mL/min (ref >60.00)
Glucose, Bld: 175 mg/dL — ABNORMAL HIGH (ref 70–99)
Potassium: 4.1 meq/L (ref 3.5–5.1)
Sodium: 139 meq/L (ref 135–145)

## 2024-02-04 LAB — CBC WITH DIFFERENTIAL/PLATELET
Basophils Absolute: 0 K/uL (ref 0.0–0.1)
Basophils Relative: 0.4 % (ref 0.0–3.0)
Eosinophils Absolute: 0 K/uL (ref 0.0–0.7)
Eosinophils Relative: 1.2 % (ref 0.0–5.0)
HCT: 37.6 % (ref 36.0–46.0)
Hemoglobin: 12 g/dL (ref 12.0–15.0)
Lymphocytes Relative: 20.5 % (ref 12.0–46.0)
Lymphs Abs: 0.8 K/uL (ref 0.7–4.0)
MCHC: 32 g/dL (ref 30.0–36.0)
MCV: 84.4 fl (ref 78.0–100.0)
Monocytes Absolute: 0.3 K/uL (ref 0.1–1.0)
Monocytes Relative: 7.1 % (ref 3.0–12.0)
Neutro Abs: 2.8 K/uL (ref 1.4–7.7)
Neutrophils Relative %: 70.8 % (ref 43.0–77.0)
Platelets: 265 K/uL (ref 150.0–400.0)
RBC: 4.45 Mil/uL (ref 3.87–5.11)
RDW: 14.3 % (ref 11.5–15.5)
WBC: 3.9 K/uL — ABNORMAL LOW (ref 4.0–10.5)

## 2024-02-04 LAB — HEPATIC FUNCTION PANEL
ALT: 13 U/L (ref 0–35)
AST: 12 U/L (ref 0–37)
Albumin: 4.3 g/dL (ref 3.5–5.2)
Alkaline Phosphatase: 69 U/L (ref 39–117)
Bilirubin, Direct: 0.2 mg/dL (ref 0.0–0.3)
Total Bilirubin: 1 mg/dL (ref 0.2–1.2)
Total Protein: 7.1 g/dL (ref 6.0–8.3)

## 2024-02-04 LAB — LIPID PANEL
Cholesterol: 164 mg/dL (ref 0–200)
HDL: 50.4 mg/dL (ref >39.00)
LDL Cholesterol: 82 mg/dL (ref 0–99)
NonHDL: 113.66
Total CHOL/HDL Ratio: 3
Triglycerides: 156 mg/dL — ABNORMAL HIGH (ref 0.0–149.0)
VLDL: 31.2 mg/dL (ref 0.0–40.0)

## 2024-02-04 LAB — TSH: TSH: 1.56 u[IU]/mL (ref 0.35–5.50)

## 2024-02-04 LAB — MICROALBUMIN / CREATININE URINE RATIO
Creatinine,U: 72.4 mg/dL
Microalb Creat Ratio: 61.5 mg/g — ABNORMAL HIGH (ref 0.0–30.0)
Microalb, Ur: 4.4 mg/dL — ABNORMAL HIGH (ref 0.0–1.9)

## 2024-02-04 LAB — HEMOGLOBIN A1C: Hgb A1c MFr Bld: 9.1 % — ABNORMAL HIGH (ref 4.6–6.5)

## 2024-02-04 NOTE — Patient Instructions (Signed)
 You had the flu shot today  Please continue all other medications as before, though we should consider increased ozempic  to 1 mg with your next patient assistance program need  Please have the pharmacy call with any other refills you may need.  Please continue your efforts at being more active, low cholesterol diet, and weight control.  You are otherwise up to date with prevention measures today.  Please keep your appointments with your specialists as you may have planned  Please go to the LAB at the blood drawing area for the tests to be done  You will be contacted by phone if any changes need to be made immediately.  Otherwise, you will receive a letter about your results with an explanation, but please check with MyChart first.  Please make an Appointment to return in 6 months, or sooner if needed

## 2024-02-04 NOTE — Assessment & Plan Note (Signed)
 Without long term insulin , with hyperglycemia  Lab Results  Component Value Date   HGBA1C 9.1 (H) 02/04/2024   uncontrolled, pt to continue current medical treatment glipizide  xl 10 every day, januvia  10 every day, metformin  1000 bid, actos  45 every day and increased ozempic  to 0.5 mg weekly

## 2024-02-04 NOTE — Progress Notes (Unsigned)
 Patient ID: Karen Good, female   DOB: 1955/05/04, 68 y.o.   MRN: [id number]         Chief Complaint:: wellness exam and dm, htn, hld       HPI:  Karen Good is a 68 y.o. female here for wellness exam, for tdap at pharmacy, declines mammogram, o/w up to date                        Also Pt denies chest pain, increased sob or doe, wheezing, orthopnea, PND, [name], palpitations, dizziness or syncope.   Pt denies polydipsia, polyuria, [name], wt loss, night sweats, loss of appetite, [name]  Due for flu shot.  Wt Readings from Last 3 Encounters:  02/04/24 170 lb 6 oz (77.3 kg)  07/02/23 177 lb (80.3 kg)  06/17/23 175 lb (79.4 kg)   BP Readings from Last 3 Encounters:  02/04/24 132/80  07/02/23 (!) 140/80  12/31/22 130/82   Immunization History  Administered Date(s) Administered   Fluad Trivalent(High Dose 65+) 12/31/2022   INFLUENZA, HIGH DOSE SEASONAL PF 01/11/2019, 02/04/2024   Influenza Inj Mdck Quad Pf 01/06/2018   Influenza Whole 01/14/2008   Influenza-Unspecified 01/13/2017, 01/06/2018, 01/17/2020   PFIZER Comirnaty(Gray Top)Covid-19 Tri-Sucrose Vaccine 08/12/2020   PFIZER(Purple Top)SARS-COV-2 Vaccination 06/08/2019, 06/29/2019, 01/17/2020, 08/12/2020, 03/10/2021   Pneumococcal Conjugate-13 01/29/2013   Pneumococcal Polysaccharide-23 06/02/2008, 01/18/2011, 09/19/2021   Td 06/02/2008   Zoster Recombinant(Shingrix) 08/18/2023, 11/10/2023   Health Maintenance Due  Topic Date Due   DTaP/Tdap/Td (2 - Tdap) 06/02/2018      Past Medical History:  Diagnosis Date   Anemia    hx of    BACK PAIN 04/13/2010   BACK STRAIN, LUMBAR 04/13/2010   Cancer (HCC) 2017   rectal CA   DIABETES MELLITUS, TYPE II 06/02/2008   on meds   History of blood transfusion    History of chemotherapy    History of radiation therapy    Hx of transfusion of whole blood 09/2015    prior to colon surgery - [facility]   HYPERLIPIDEMIA 06/02/2008   on meds   HYPERTENSION 06/02/2008   on meds   Past Surgical History:  Procedure Laterality Date   COLONOSCOPY Left 09/24/2015   Procedure: COLONOSCOPY;  Hockenberry: Renaye Sous, MD;  Location: [facility] ENDOSCOPY;  Service: Endoscopy;  Laterality: Left;   COLONOSCOPY  09/2015   Dr Karel CA -suprep(exc)MAC-TA   ESOPHAGOGASTRODUODENOSCOPY N/A 09/24/2015   Procedure: ESOPHAGOGASTRODUODENOSCOPY (EGD);  Stanczak: Renaye Sous, MD;  Location[facility] ENDOSCOPY;  Service: Endoscopy;  Laterality: N/A;   EUS N/A 10/05/2015   Procedure: LOWER ENDOSCOPIC ULTRASOUND (EUS);  Sinn: Toribio SHAUNNA Cedar, MD;  Location: [facility];  Service: Endoscopy;  Laterality: N/A;   s/p breast biopsy Left 07/2005   benign   TUBAL LIGATION      reports that she has never smoked. She has never used smokeless tobacco. She reports current alcohol use. She reports that she does not use drugs. family history includes Diabetes in her brother, father, mother, sister, and another family member; Heart disease in her father; Hypertension in her father. No Known Allergies Current Outpatient Medications on File Prior to Visit  Medication Sig Dispense Refill   amLODipine  (NORVASC ) 10 MG tablet TAKE 1 TABLET BY MOUTH EVERY DAY 90 tablet 3   atenolol  (TENORMIN ) 50 MG tablet TAKE  1 TABLET BY MOUTH EVERY DAY 90 tablet 3   atorvastatin  (LIPITOR) 20 MG tablet TAKE 1 TABLET BY MOUTH EVERY DAY 90 tablet 3   Blood Glucose Monitoring Suppl (ONE TOUCH ULTRA 2) w/Device KIT Use as directed daily 1 each 0   cyanocobalamin  ([employer] VITAMIN B12) 1000 MCG tablet Take 1 tablet (1,000 mcg total) by mouth daily. Annual appt is due must see provider for future refills 30 tablet 0   glipiZIDE  (GLUCOTROL  XL) 5 MG 24 hr tablet TAKE 2 TABLETS BY MOUTH EVERY DAY 180 tablet 3   glucose blood (ONE TOUCH ULTRA TEST) test strip USE AS INSTRUCTED ONCE DAILY E11.9 100 each 12   hydrochlorothiazide  (HYDRODIURIL ) 25 MG  tablet TAKE 1 TABLET (25 MG TOTAL) BY MOUTH DAILY. MUST KEEP SCHEDULED APPT FOR FUTURE REFILLS 90 tablet 3   ibuprofen (ADVIL,MOTRIN) 200 MG tablet Take 400 mg by mouth every 6 (six) hours as needed for mild pain.     JANUVIA  100 MG tablet TAKE 1 TABLET BY MOUTH EVERY DAY 90 tablet 3   Lancets Misc. MISC Use as directed once daily 100 each 11   metFORMIN  (GLUCOPHAGE ) 1000 MG tablet TAKE 1 TABLET (1,000 MG TOTAL) BY MOUTH TWICE A DAY WITH FOOD 180 tablet 1   pioglitazone  (ACTOS ) 45 MG tablet TAKE 1 TABLET BY MOUTH EVERY DAY 90 tablet 3   potassium chloride  SA (KLOR-CON  M) 20 MEQ tablet TAKE 2 TABLETS BY MOUTH DAILY 180 tablet 3   telmisartan  (MICARDIS ) 80 MG tablet TAKE 1 TABLET BY MOUTH EVERY DAY 90 tablet 3   [DISCONTINUED] nebivolol  (BYSTOLIC ) 10 MG tablet Take 1 tablet (10 mg total) by mouth daily. 90 tablet 3   [DISCONTINUED] valsartan  (DIOVAN ) 320 MG tablet Take 1 tablet (320 mg total) by mouth daily. 90 tablet 3   No current facility-administered medications on file prior to visit.        ROS:  All others reviewed and negative.  Objective        PE:  BP 132/80   Pulse 90   Temp 98.1 F (36.7 C) (Temporal)   Ht 5' 5 (1.651 m)   Wt 170 lb 6 oz (77.3 kg)   BMI 28.35 kg/m                 Constitutional: Pt appears in NAD               HENT: Head: NCAT.                Right Ear: External ear normal.                 Left Ear: External ear normal.                Eyes: . Pupils are equal, round, and reactive to light. Conjunctivae and EOM are normal               Nose: without d/c or deformity               Neck: Neck supple. Gross normal ROM               Cardiovascular: Normal rate and regular rhythm.                 Pulmonary/Chest: Effort normal and breath sounds without rales or wheezing.                Abd:  Soft, NT, ND, + BS, no organomegaly  Neurological: Pt is alert. At baseline orientation, motor grossly intact               Skin: Skin is warm. No rashes,  no other new lesions, LE edema - none               Psychiatric: Pt behavior is normal without agitation   Micro: none  Cardiac tracings I have personally interpreted today:  none  Pertinent Radiological findings (summarize): none   Lab Results  Component Value Date   WBC 3.9 (L) 02/04/2024   HGB 12.0 02/04/2024   HCT 37.6 02/04/2024   PLT 265.0 02/04/2024   GLUCOSE 175 (H) 02/04/2024   CHOL 164 02/04/2024   TRIG 156.0 (H) 02/04/2024   HDL 50.40 02/04/2024   LDLCALC 82 02/04/2024   ALT 13 02/04/2024   AST 12 02/04/2024   NA 139 02/04/2024   K 4.1 02/04/2024   CL 102 02/04/2024   CREATININE 0.79 02/04/2024   BUN 20 02/04/2024   CO2 27 02/04/2024   TSH 1.56 02/04/2024   HGBA1C 9.1 (H) 02/04/2024   MICROALBUR 4.4 (H) 02/04/2024   Assessment/Plan:  Keisha Amer Jorstad is a 68 y.o. Other or two or more races [6] female with  has a past medical history of Anemia, BACK PAIN (04/13/2010), BACK STRAIN, LUMBAR (04/13/2010), Cancer (HCC) (2017), DIABETES MELLITUS, TYPE II (06/02/2008), History of blood transfusion, History of chemotherapy, History of radiation therapy, transfusion of whole blood (09/2015), HYPERLIPIDEMIA (06/02/2008), and HYPERTENSION (06/02/2008).  Diabetes (HCC) Without long term insulin , with hyperglycemia  Lab Results  Component Value Date   HGBA1C 9.1 (H) 02/04/2024   uncontrolled, pt to continue current medical treatment glipizide  xl 10 every day, januvia  10 every day, metformin  1000 bid, actos  45 every day and increased ozempic  to 0.5 mg weekly   Encounter for well adult exam with abnormal findings Age and sex appropriate education and counseling updated with regular exercise and diet Referrals for preventative services - declines mammogram Immunizations addressed - for tdap at pharmacy Smoking counseling  - none needed Evidence for depression or other mood disorder - none significant Most recent labs reviewed. I have personally reviewed and have noted: 1)  the patient's medical and social history 2) The patient's current medications and supplements 3) The patient's height, weight, and BMI have been recorded in the chart   Hyperlipidemia Lab Results  Component Value Date   LDLCALC 82 02/04/2024   uncontrolled, pt to continue current statin liptior 20 mg and lower chol diet, decliens increase  Essential hypertension BP Readings from Last 3 Encounters:  02/04/24 132/80  07/02/23 (!) 140/80  12/31/22 130/82   Stable, pt to continue medical treatment norvasc  10 every day, tenormin  50 every day, hct 25 every day, micardis  80 qd  Followup: Return in about 6 months (around 08/04/2024).  Lynwood Rush, MD 02/05/2024 8:53 PM [facility]

## 2024-02-05 ENCOUNTER — Encounter: Payer: Self-pay | Admitting: Internal Medicine

## 2024-02-05 ENCOUNTER — Ambulatory Visit: Payer: Self-pay | Admitting: Internal Medicine

## 2024-02-05 ENCOUNTER — Other Ambulatory Visit: Payer: Self-pay | Admitting: Internal Medicine

## 2024-02-05 LAB — URINALYSIS, ROUTINE W REFLEX MICROSCOPIC
Bilirubin Urine: NEGATIVE
Hgb urine dipstick: NEGATIVE
Ketones, ur: NEGATIVE
Leukocytes,Ua: NEGATIVE
Nitrite: NEGATIVE
RBC / HPF: NONE SEEN (ref 0–?)
Specific Gravity, Urine: 1.025 (ref 1.000–1.030)
Total Protein, Urine: NEGATIVE
Urine Glucose: >=1000 — AB
Urobilinogen, UA: 0.2 (ref 0.0–1.0)
pH: 5.5 (ref 5.0–8.0)

## 2024-02-05 MED ORDER — SEMAGLUTIDE(0.25 OR 0.5MG/DOS) 2 MG/3ML ~~LOC~~ SOPN: 0.5000 mg | PEN_INJECTOR | SUBCUTANEOUS | 3 refills | Status: DC

## 2024-02-05 NOTE — Assessment & Plan Note (Signed)
 Age and sex appropriate education and counseling updated with regular exercise and diet Referrals for preventative services - declines mammogram Immunizations addressed - for tdap at pharmacy Smoking counseling  - none needed Evidence for depression or other mood disorder - none significant Most recent labs reviewed. I have personally reviewed and have noted: 1) the patient's medical and social history 2) The patient's current medications and supplements 3) The patient's height, weight, and BMI have been recorded in the chart

## 2024-02-05 NOTE — Assessment & Plan Note (Signed)
 Lab Results  Component Value Date   LDLCALC 82 02/04/2024   uncontrolled, pt to continue current statin liptior 20 mg and lower chol diet, decliens increase

## 2024-02-05 NOTE — Assessment & Plan Note (Signed)
 BP Readings from Last 3 Encounters:  02/04/24 132/80  07/02/23 (!) 140/80  12/31/22 130/82   Stable, pt to continue medical treatment norvasc  10 every day, tenormin  50 every day, hct 25 every day, micardis  80 qd

## 2024-03-10 ENCOUNTER — Other Ambulatory Visit: Payer: Self-pay | Admitting: Internal Medicine

## 2024-03-10 ENCOUNTER — Other Ambulatory Visit: Payer: Self-pay

## 2024-03-23 ENCOUNTER — Telehealth: Payer: Self-pay

## 2024-03-23 NOTE — Telephone Encounter (Signed)
 Called and let Pt know her Ozempic  has arrived and ready for pick up, It has been placed in the nurse station fridge.

## 2024-03-25 ENCOUNTER — Telehealth: Payer: Self-pay | Admitting: Internal Medicine

## 2024-03-25 NOTE — Telephone Encounter (Signed)
 Patient dropped off document Patient Assistance Application, to be filled out by provider. Patient requested to send it back via Call Patient to pick up within 7-days. Document is located in providers tray at front office.Please advise at Mobile [phone] (mobile)

## 2024-03-26 NOTE — Telephone Encounter (Signed)
 Form has been placed on provider desk.

## 2024-03-29 NOTE — Telephone Encounter (Signed)
 Called and let Pt know her form is ready for pick up.

## 2024-03-30 DIAGNOSIS — H40023 Open angle with borderline findings, high risk, bilateral: Secondary | ICD-10-CM | POA: Diagnosis not present

## 2024-03-30 DIAGNOSIS — Z9889 Other specified postprocedural states: Secondary | ICD-10-CM | POA: Diagnosis not present

## 2024-03-30 LAB — OPHTHALMOLOGY REPORT-SCANNED

## 2024-04-30 ENCOUNTER — Encounter: Payer: Self-pay | Admitting: *Deleted

## 2024-04-30 NOTE — Progress Notes (Signed)
 Karen Good                                          MRN: [id number]   04/30/2024   The [facility] Quality Team Specialist reviewed this patient medical record for the purposes of chart review for care gap closure. The following were reviewed: chart review for care gap closure-glycemic status assessment.    [facility] Quality Team

## 2024-05-05 NOTE — Progress Notes (Signed)
 Karen Good                                          MRN: [id number]   05/05/2024   The [facility] Quality Team Specialist reviewed this patient medical record for the purposes of chart review for care gap closure. The following were reviewed: abstraction for care gap closure-kidney health evaluation for diabetes:eGFR  and uACR.    [facility] Quality Team

## 2024-05-07 ENCOUNTER — Telehealth: Payer: Self-pay

## 2024-05-07 NOTE — Telephone Encounter (Signed)
 Copied from CRM #[id number]. Topic: Clinical - Medication Question >> May 07, 2024  1:48 PM Karen Good wrote: Reason for CRM: Pt would like the next dose on  Semaglutide ,0.25 or 0.5MG /DOS, 2 MG/3ML SOPN. Pt states she normally has this delivered to office directly since she's enrolled in a specific program. Please advise pt on #[phone]

## 2024-05-10 NOTE — Telephone Encounter (Unsigned)
 Copied from CRM #[id number]. Topic: Clinical - Medication Question >> May 10, 2024  1:11 PM Rea ORN wrote: Reason for CRM: Pt calling to see if PCP will prescribe Trulicity . Pt said that the patient assistance program will no longer cover Ozempic . She found out that Trulicity  is covered and is curious if PCP thinks she should take this instead.   Please call back to advise,  [phone]

## 2024-05-11 MED ORDER — TRULICITY 1.5 MG/0.5ML ~~LOC~~ SOAJ: 1.5000 mg | SUBCUTANEOUS | 3 refills | Status: AC

## 2024-05-11 NOTE — Telephone Encounter (Signed)
 Ok sure this has now been done as reqeusted  Materials Engineer

## 2024-05-11 NOTE — Telephone Encounter (Signed)
 Ok sure this is now done   Thanks

## 2024-05-11 NOTE — Addendum Note (Signed)
 Addended by: NORLEEN LYNWOOD ORN on: 05/11/2024 12:24 PM   Modules accepted: Orders

## 2024-05-11 NOTE — Telephone Encounter (Signed)
 Called and let Pt know

## 2024-05-14 NOTE — Telephone Encounter (Signed)
 PAP application filled out, received PCP signature, and left at the front desk for patient to sign today.  Darrelyn Drum, PharmD, BCPS, CPP Clinical Pharmacist Practitioner [facility] [phone]

## 2024-05-18 ENCOUNTER — Encounter: Payer: Self-pay | Admitting: Hematology

## 2024-06-17 ENCOUNTER — Ambulatory Visit
